# Patient Record
Sex: Male | Born: 1945 | Race: White | Hispanic: No | Smoking: Current every day smoker
Health system: Southern US, Community
[De-identification: ages and names within clinical notes are randomized; demographics above are authoritative.]

## PROBLEM LIST (undated history)

## (undated) DIAGNOSIS — C801 Malignant (primary) neoplasm, unspecified: Secondary | ICD-10-CM

## (undated) DIAGNOSIS — I739 Peripheral vascular disease, unspecified: Secondary | ICD-10-CM

## (undated) DIAGNOSIS — K5730 Diverticulosis of large intestine without perforation or abscess without bleeding: Secondary | ICD-10-CM

## (undated) DIAGNOSIS — K635 Polyp of colon: Secondary | ICD-10-CM

## (undated) DIAGNOSIS — E785 Hyperlipidemia, unspecified: Secondary | ICD-10-CM

## (undated) DIAGNOSIS — I10 Essential (primary) hypertension: Secondary | ICD-10-CM

## (undated) DIAGNOSIS — M5430 Sciatica, unspecified side: Secondary | ICD-10-CM

## (undated) HISTORY — DX: Malignant (primary) neoplasm, unspecified: C80.1

## (undated) HISTORY — DX: Hyperlipidemia, unspecified: E78.5

## (undated) HISTORY — PX: OTHER SURGICAL HISTORY: SHX169

## (undated) HISTORY — DX: Essential (primary) hypertension: I10

## (undated) HISTORY — DX: Diverticulosis of large intestine without perforation or abscess without bleeding: K57.30

## (undated) HISTORY — DX: Sciatica, unspecified side: M54.30

## (undated) HISTORY — DX: Peripheral vascular disease, unspecified: I73.9

## (undated) HISTORY — DX: Polyp of colon: K63.5

---

## 2000-04-02 ENCOUNTER — Encounter: Admission: RE | Admit: 2000-04-02 | Discharge: 2000-04-02 | Payer: Self-pay | Admitting: *Deleted

## 2000-04-02 ENCOUNTER — Encounter: Payer: Self-pay | Admitting: *Deleted

## 2000-08-13 ENCOUNTER — Encounter (INDEPENDENT_AMBULATORY_CARE_PROVIDER_SITE_OTHER): Payer: Self-pay | Admitting: *Deleted

## 2000-08-13 ENCOUNTER — Ambulatory Visit (HOSPITAL_COMMUNITY): Admission: RE | Admit: 2000-08-13 | Discharge: 2000-08-13 | Payer: Self-pay | Admitting: Gastroenterology

## 2003-06-11 HISTORY — PX: ABDOMINAL AORTIC ANEURYSM REPAIR: SUR1152

## 2003-06-24 ENCOUNTER — Encounter: Admission: RE | Admit: 2003-06-24 | Discharge: 2003-06-24 | Payer: Self-pay | Admitting: Internal Medicine

## 2003-06-28 ENCOUNTER — Encounter: Admission: RE | Admit: 2003-06-28 | Discharge: 2003-06-28 | Payer: Self-pay | Admitting: Internal Medicine

## 2003-07-04 ENCOUNTER — Ambulatory Visit (HOSPITAL_COMMUNITY): Admission: RE | Admit: 2003-07-04 | Discharge: 2003-07-04 | Payer: Self-pay | Admitting: Vascular Surgery

## 2003-07-14 ENCOUNTER — Inpatient Hospital Stay (HOSPITAL_COMMUNITY): Admission: RE | Admit: 2003-07-14 | Discharge: 2003-07-19 | Payer: Self-pay | Admitting: Vascular Surgery

## 2003-07-14 ENCOUNTER — Encounter (INDEPENDENT_AMBULATORY_CARE_PROVIDER_SITE_OTHER): Payer: Self-pay | Admitting: Specialist

## 2003-11-29 ENCOUNTER — Ambulatory Visit (HOSPITAL_COMMUNITY): Admission: RE | Admit: 2003-11-29 | Discharge: 2003-11-29 | Payer: Self-pay | Admitting: Gastroenterology

## 2004-09-11 ENCOUNTER — Ambulatory Visit: Payer: Self-pay | Admitting: Internal Medicine

## 2005-01-03 ENCOUNTER — Ambulatory Visit: Payer: Self-pay | Admitting: Internal Medicine

## 2005-01-10 ENCOUNTER — Ambulatory Visit: Payer: Self-pay | Admitting: Internal Medicine

## 2005-04-23 ENCOUNTER — Ambulatory Visit: Payer: Self-pay | Admitting: Internal Medicine

## 2005-05-20 ENCOUNTER — Ambulatory Visit: Payer: Self-pay | Admitting: Internal Medicine

## 2005-05-30 ENCOUNTER — Encounter: Admission: RE | Admit: 2005-05-30 | Discharge: 2005-08-28 | Payer: Self-pay | Admitting: Internal Medicine

## 2005-07-28 IMAGING — US US RETROPERITONEAL COMPLETE
1 series · 13 of 25 positions shown · non-contrast
Comparison: none

CLINICAL DATA: Wheezing, cough, recently stopped smoking, mid abdominal mass, pain, and swelling.  
 RENAL AND AORTIC ULTRASOUND 
 Multiple scans of the abdomen show the mid and lower abdominal aorta to be dilated.  The abdominal aorta measures 5.3 cm in maximal AP diameter and 5.2 cm in transverse diameter.  The right common iliac measures 2.1 cm and the left common iliac measures 1.9 cm.  No evidence of hemorrhage is seen but there is a moderate amount of laminated clot associated with the abdominal aortic aneurysm.  The right kidney measures 12.6 cm in length and appears normal.  The left kidney measures 12.0 cm in length and appears normal.
 IMPRESSION
 Abdominal aortic aneurysm measuring 5.2 x 5.3 cm.  No evidence of active hemorrhage but there is a moderate amount of laminated clot.
 TWO VIEW CHEST 
 PA and lateral views of the chest show generalized peribronchial thickening with flattening of the hemidiaphragm and increased AP diameter of the chest suggesting a mild to moderate degree of chronic obstructive pulmonary disease.  The aorta is minimally elongated, dilated, and calcified.  The heart is within normal limit.  The bony thorax is normal for the patient?s age.
 Mild to moderate chronic obstructive pulmonary change.  No active disease in the chest.

[Series 1: unknown · 0.29mm/px · 13 of 44 slices shown]
[im 1/44]
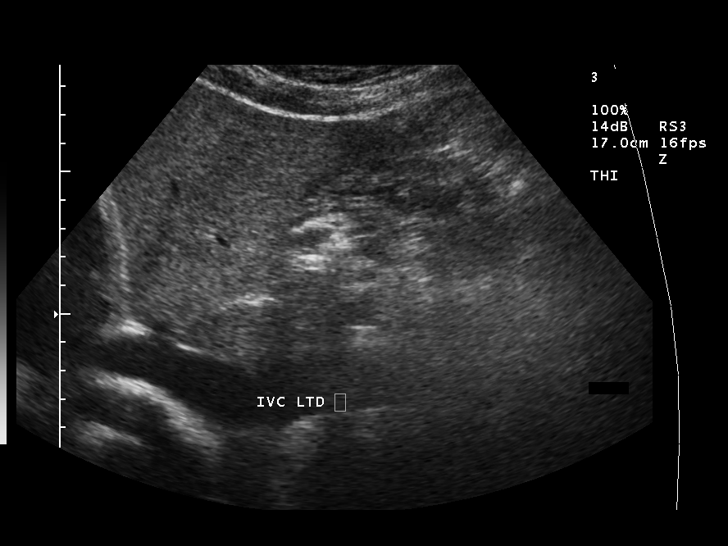
[im 4/44]
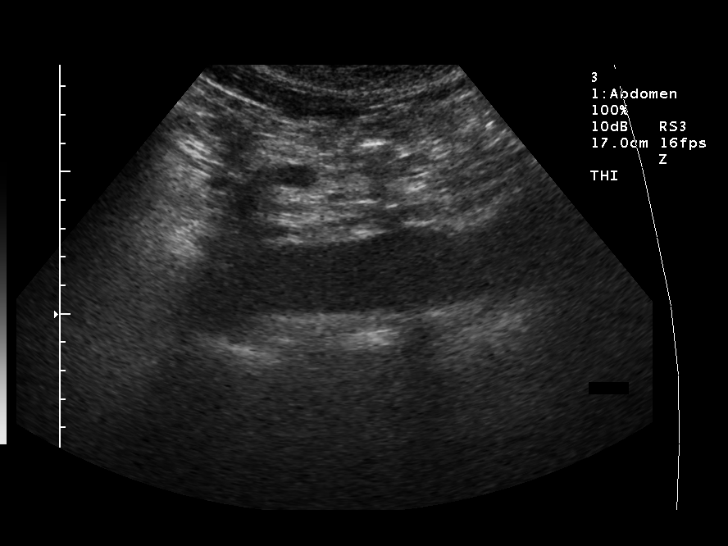
[im 8/44]
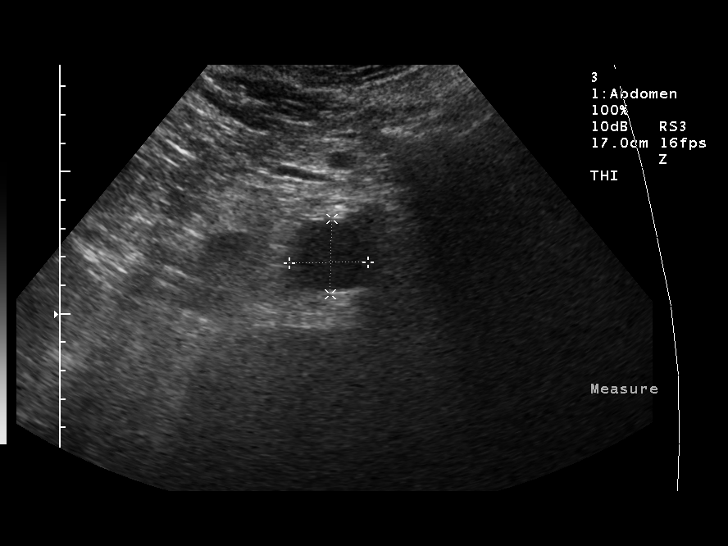
[im 11/44]
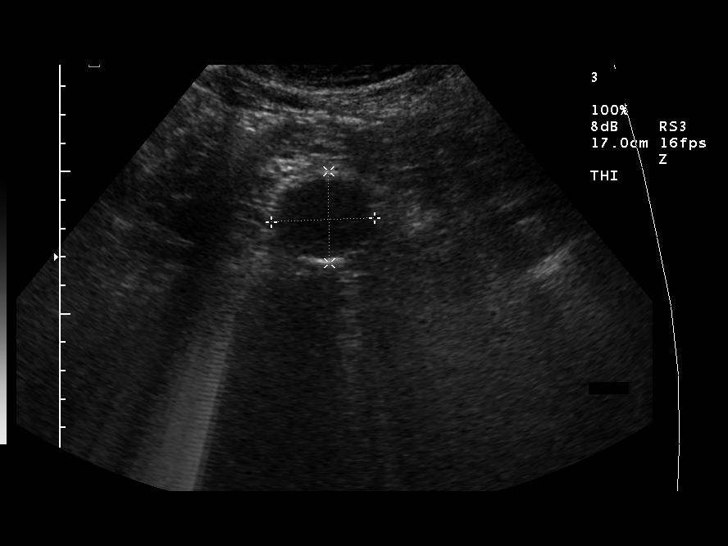
[im 15/44]
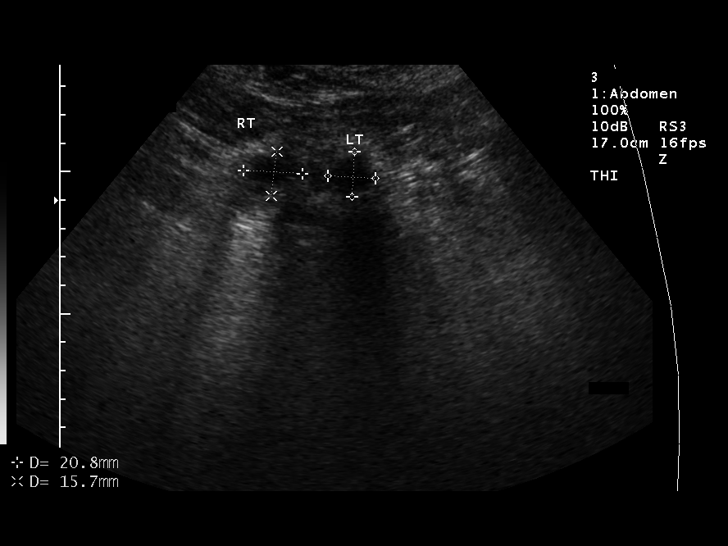
[im 18/44]
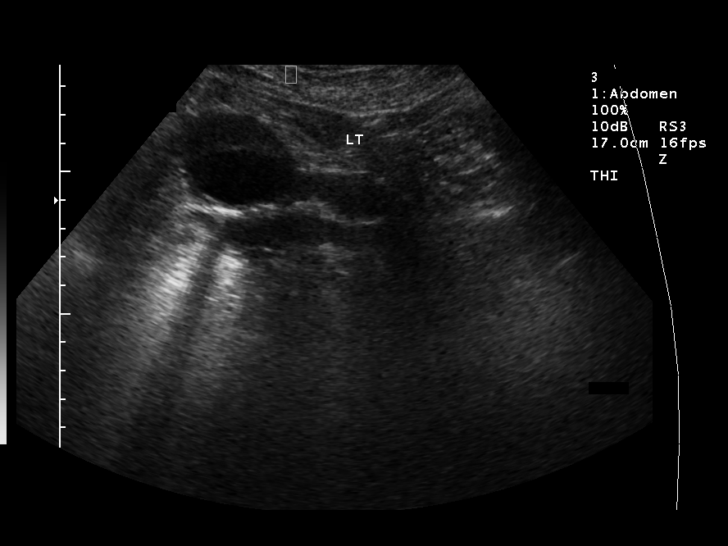
[im 22/44]
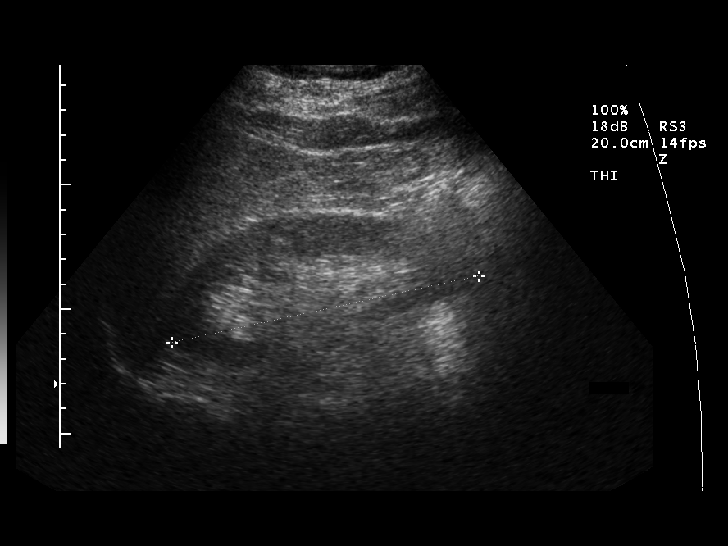
[im 26/44]
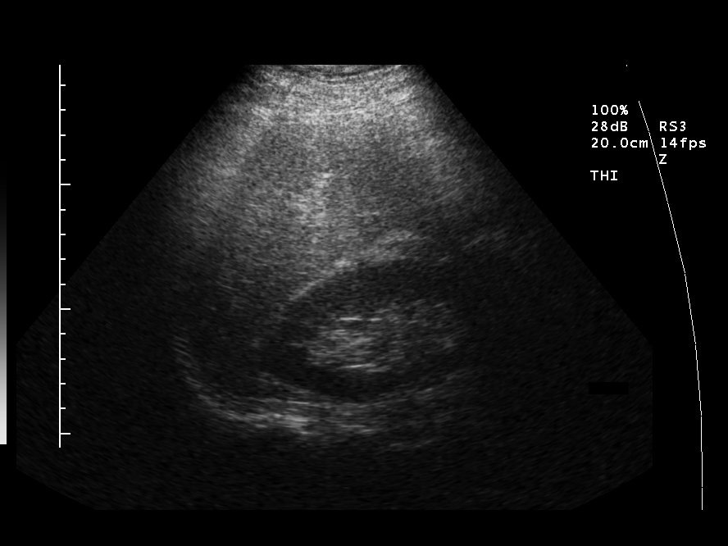
[im 29/44]
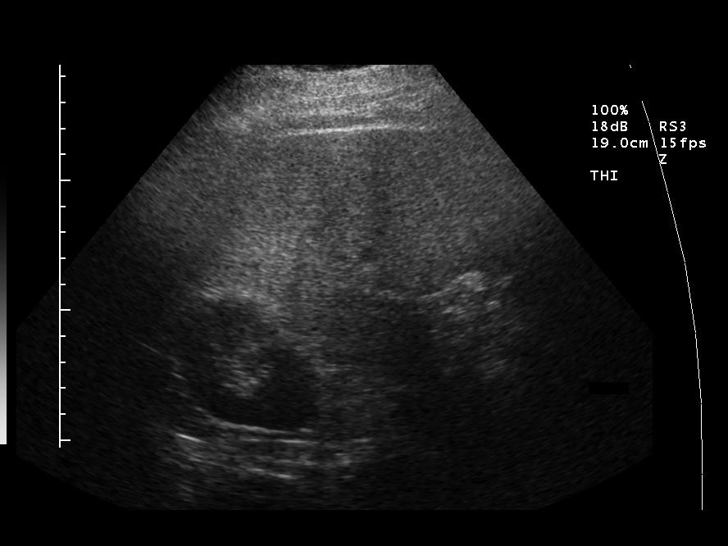
[im 33/44]
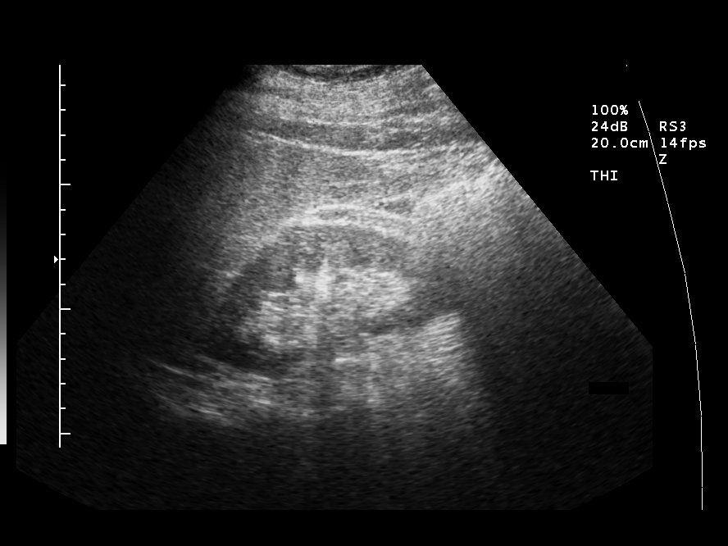
[im 36/44]
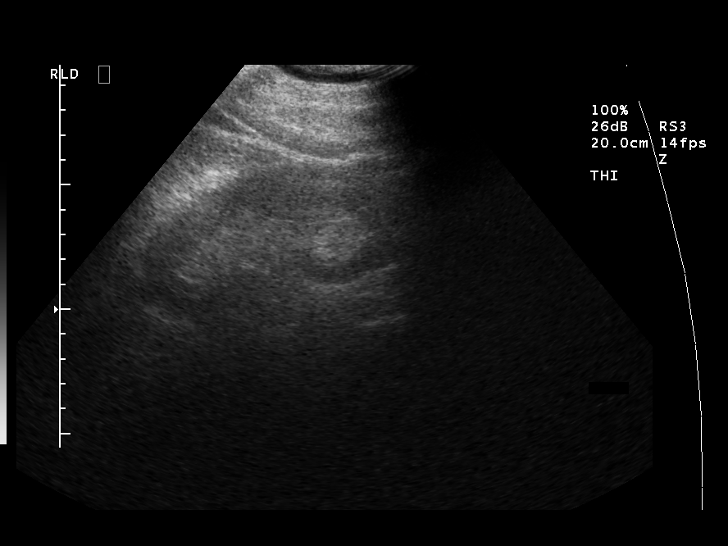
[im 40/44]
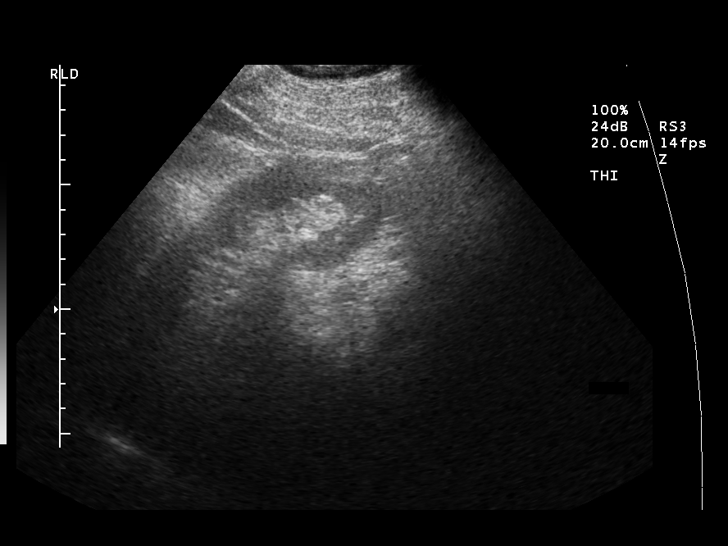
[im 44/44]
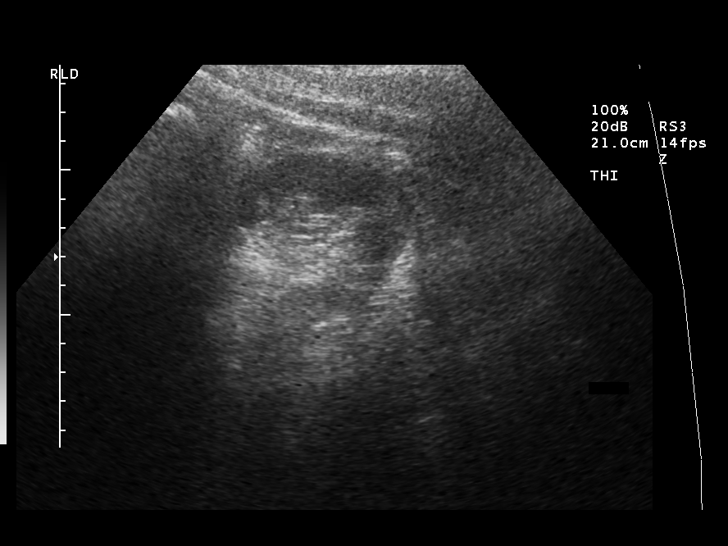

[13 of 25 positions shown; findings below may reference images not displayed]

## 2006-05-14 ENCOUNTER — Ambulatory Visit: Payer: Self-pay | Admitting: Internal Medicine

## 2006-05-14 ENCOUNTER — Encounter (INDEPENDENT_AMBULATORY_CARE_PROVIDER_SITE_OTHER): Payer: Self-pay | Admitting: *Deleted

## 2006-05-14 LAB — CONVERTED CEMR LAB
ALT: 27 units/L (ref 0–40)
AST: 26 units/L (ref 0–37)
BUN: 22 mg/dL (ref 6–23)
Chol/HDL Ratio, serum: 3.8
Cholesterol: 184 mg/dL (ref 0–200)
Creatinine, Ser: 1.1 mg/dL (ref 0.4–1.5)
HDL: 48.6 mg/dL (ref >39.0)
Hgb A1c MFr Bld: 6.1 % — ABNORMAL HIGH (ref 4.6–6.0)
LDL Cholesterol: 100 mg/dL — ABNORMAL HIGH (ref 0–99)
PSA: 0.45 ng/mL
PSA: 0.45 ng/mL (ref 0.10–4.00)
Potassium: 4.1 meq/L (ref 3.5–5.1)
Triglyceride fasting, serum: 175 mg/dL — ABNORMAL HIGH (ref 0–149)
VLDL: 35 mg/dL (ref 0–40)

## 2006-05-22 ENCOUNTER — Ambulatory Visit: Payer: Self-pay | Admitting: Internal Medicine

## 2006-09-10 ENCOUNTER — Ambulatory Visit: Payer: Self-pay | Admitting: Internal Medicine

## 2006-09-10 LAB — CONVERTED CEMR LAB
ALT: 31 units/L (ref 0–40)
AST: 24 units/L (ref 0–37)
BUN: 17 mg/dL (ref 6–23)
Cholesterol: 159 mg/dL (ref 0–200)
Creatinine, Ser: 1 mg/dL (ref 0.4–1.5)
Creatinine,U: 187 mg/dL
HDL: 53.2 mg/dL (ref >39.0)
Hgb A1c MFr Bld: 6 % (ref 4.6–6.0)
LDL Cholesterol: 73 mg/dL (ref 0–99)
Microalb Creat Ratio: 1.1 mg/g (ref 0.0–30.0)
Microalb, Ur: 0.2 mg/dL (ref 0.0–1.9)
Potassium: 4.1 meq/L (ref 3.5–5.1)
Total CHOL/HDL Ratio: 3
Triglycerides: 165 mg/dL — ABNORMAL HIGH (ref 0–149)
VLDL: 33 mg/dL (ref 0–40)

## 2007-07-27 ENCOUNTER — Telehealth: Payer: Self-pay | Admitting: Internal Medicine

## 2007-08-06 ENCOUNTER — Telehealth (INDEPENDENT_AMBULATORY_CARE_PROVIDER_SITE_OTHER): Payer: Self-pay | Admitting: *Deleted

## 2007-08-06 ENCOUNTER — Encounter (INDEPENDENT_AMBULATORY_CARE_PROVIDER_SITE_OTHER): Payer: Self-pay | Admitting: *Deleted

## 2007-08-06 DIAGNOSIS — E785 Hyperlipidemia, unspecified: Secondary | ICD-10-CM

## 2007-08-06 DIAGNOSIS — E782 Mixed hyperlipidemia: Secondary | ICD-10-CM | POA: Insufficient documentation

## 2007-08-06 DIAGNOSIS — E8881 Metabolic syndrome: Secondary | ICD-10-CM | POA: Insufficient documentation

## 2007-08-06 DIAGNOSIS — Z85828 Personal history of other malignant neoplasm of skin: Secondary | ICD-10-CM | POA: Insufficient documentation

## 2007-08-06 DIAGNOSIS — I10 Essential (primary) hypertension: Secondary | ICD-10-CM | POA: Insufficient documentation

## 2007-09-11 ENCOUNTER — Telehealth (INDEPENDENT_AMBULATORY_CARE_PROVIDER_SITE_OTHER): Payer: Self-pay | Admitting: *Deleted

## 2007-11-05 ENCOUNTER — Telehealth (INDEPENDENT_AMBULATORY_CARE_PROVIDER_SITE_OTHER): Payer: Self-pay | Admitting: *Deleted

## 2007-11-20 ENCOUNTER — Telehealth (INDEPENDENT_AMBULATORY_CARE_PROVIDER_SITE_OTHER): Payer: Self-pay | Admitting: *Deleted

## 2007-12-24 ENCOUNTER — Ambulatory Visit: Payer: Self-pay | Admitting: Internal Medicine

## 2007-12-24 DIAGNOSIS — M545 Low back pain, unspecified: Secondary | ICD-10-CM | POA: Insufficient documentation

## 2008-01-04 ENCOUNTER — Ambulatory Visit: Payer: Self-pay | Admitting: Internal Medicine

## 2008-01-04 ENCOUNTER — Encounter (INDEPENDENT_AMBULATORY_CARE_PROVIDER_SITE_OTHER): Payer: Self-pay | Admitting: *Deleted

## 2008-01-04 LAB — CONVERTED CEMR LAB
OCCULT 1: NEGATIVE
OCCULT 2: NEGATIVE
OCCULT 3: NEGATIVE

## 2008-06-20 ENCOUNTER — Encounter: Payer: Self-pay | Admitting: Internal Medicine

## 2008-12-20 ENCOUNTER — Ambulatory Visit: Payer: Self-pay | Admitting: Internal Medicine

## 2008-12-20 DIAGNOSIS — K5730 Diverticulosis of large intestine without perforation or abscess without bleeding: Secondary | ICD-10-CM | POA: Insufficient documentation

## 2008-12-20 DIAGNOSIS — R109 Unspecified abdominal pain: Secondary | ICD-10-CM | POA: Insufficient documentation

## 2008-12-20 DIAGNOSIS — Z86010 Personal history of colonic polyps: Secondary | ICD-10-CM | POA: Insufficient documentation

## 2008-12-28 ENCOUNTER — Encounter (INDEPENDENT_AMBULATORY_CARE_PROVIDER_SITE_OTHER): Payer: Self-pay | Admitting: *Deleted

## 2009-03-10 LAB — HM COLONOSCOPY

## 2009-04-11 ENCOUNTER — Encounter: Payer: Self-pay | Admitting: Internal Medicine

## 2009-04-21 ENCOUNTER — Telehealth: Payer: Self-pay | Admitting: Internal Medicine

## 2009-04-26 ENCOUNTER — Ambulatory Visit: Payer: Self-pay | Admitting: Internal Medicine

## 2009-04-26 DIAGNOSIS — M48061 Spinal stenosis, lumbar region without neurogenic claudication: Secondary | ICD-10-CM | POA: Insufficient documentation

## 2009-04-26 DIAGNOSIS — M5416 Radiculopathy, lumbar region: Secondary | ICD-10-CM | POA: Insufficient documentation

## 2009-05-03 ENCOUNTER — Encounter: Admission: RE | Admit: 2009-05-03 | Discharge: 2009-05-03 | Payer: Self-pay | Admitting: Internal Medicine

## 2009-05-24 ENCOUNTER — Encounter: Admission: RE | Admit: 2009-05-24 | Discharge: 2009-05-24 | Payer: Self-pay | Admitting: Internal Medicine

## 2009-05-31 ENCOUNTER — Encounter: Payer: Self-pay | Admitting: Internal Medicine

## 2009-06-05 ENCOUNTER — Encounter: Admission: RE | Admit: 2009-06-05 | Discharge: 2009-06-05 | Payer: Self-pay | Admitting: Internal Medicine

## 2009-09-27 ENCOUNTER — Telehealth (INDEPENDENT_AMBULATORY_CARE_PROVIDER_SITE_OTHER): Payer: Self-pay | Admitting: *Deleted

## 2009-11-30 ENCOUNTER — Telehealth (INDEPENDENT_AMBULATORY_CARE_PROVIDER_SITE_OTHER): Payer: Self-pay | Admitting: *Deleted

## 2010-04-10 HISTORY — PX: LUMBAR SPINE SURGERY: SHX701

## 2010-04-16 ENCOUNTER — Telehealth: Payer: Self-pay | Admitting: Internal Medicine

## 2010-04-16 ENCOUNTER — Telehealth (INDEPENDENT_AMBULATORY_CARE_PROVIDER_SITE_OTHER): Payer: Self-pay | Admitting: *Deleted

## 2010-04-23 ENCOUNTER — Encounter: Payer: Self-pay | Admitting: Internal Medicine

## 2010-04-24 ENCOUNTER — Encounter: Payer: Self-pay | Admitting: Internal Medicine

## 2010-04-25 ENCOUNTER — Encounter: Payer: Self-pay | Admitting: Internal Medicine

## 2010-04-26 ENCOUNTER — Encounter: Payer: Self-pay | Admitting: Internal Medicine

## 2010-05-10 ENCOUNTER — Ambulatory Visit: Payer: Self-pay | Admitting: Internal Medicine

## 2010-05-10 DIAGNOSIS — K4090 Unilateral inguinal hernia, without obstruction or gangrene, not specified as recurrent: Secondary | ICD-10-CM | POA: Insufficient documentation

## 2010-05-11 ENCOUNTER — Ambulatory Visit: Payer: Self-pay | Admitting: Internal Medicine

## 2010-05-14 LAB — CONVERTED CEMR LAB
ALT: 16 units/L (ref 0–53)
AST: 18 units/L (ref 0–37)
Albumin: 3.3 g/dL — ABNORMAL LOW (ref 3.5–5.2)
Alkaline Phosphatase: 81 units/L (ref 39–117)
BUN: 22 mg/dL (ref 6–23)
Bilirubin, Direct: 0.2 mg/dL (ref 0.0–0.3)
Creatinine, Ser: 0.9 mg/dL (ref 0.4–1.5)
Hgb A1c MFr Bld: 5.8 % (ref 4.6–6.5)
PSA: 1.05 ng/mL (ref 0.10–4.00)
Potassium: 4.5 meq/L (ref 3.5–5.1)
TSH: 2.39 microintl units/mL (ref 0.35–5.50)
Total Bilirubin: 1 mg/dL (ref 0.3–1.2)
Total Protein: 6.3 g/dL (ref 6.0–8.3)

## 2010-07-08 LAB — CONVERTED CEMR LAB
ALT: 36 units/L (ref 0–53)
ALT: 36 units/L (ref 0–53)
AST: 31 units/L (ref 0–37)
AST: 36 units/L (ref 0–37)
Albumin: 3.6 g/dL (ref 3.5–5.2)
Albumin: 3.8 g/dL (ref 3.5–5.2)
Alkaline Phosphatase: 63 units/L (ref 39–117)
Alkaline Phosphatase: 69 units/L (ref 39–117)
BUN: 12 mg/dL (ref 6–23)
BUN: 20 mg/dL (ref 6–23)
Basophils Absolute: 0 10*3/uL (ref 0.0–0.1)
Basophils Relative: 0.7 % (ref 0.0–3.0)
Bilirubin, Direct: 0.1 mg/dL (ref 0.0–0.3)
Bilirubin, Direct: 0.2 mg/dL (ref 0.0–0.3)
CO2: 30 meq/L (ref 19–32)
CO2: 30 meq/L (ref 19–32)
Calcium: 9.2 mg/dL (ref 8.4–10.5)
Calcium: 9.2 mg/dL (ref 8.4–10.5)
Chloride: 101 meq/L (ref 96–112)
Chloride: 103 meq/L (ref 96–112)
Cholesterol, target level: 200 mg/dL
Cholesterol: 119 mg/dL (ref 0–200)
Cholesterol: 166 mg/dL (ref 0–200)
Creatinine, Ser: 0.9 mg/dL (ref 0.4–1.5)
Creatinine, Ser: 0.9 mg/dL (ref 0.4–1.5)
Eosinophils Absolute: 0.1 10*3/uL (ref 0.0–0.7)
Eosinophils Relative: 2 % (ref 0.0–5.0)
GFR calc Af Amer: 110 mL/min
GFR calc non Af Amer: 90.35 mL/min (ref >60)
GFR calc non Af Amer: 91 mL/min
Glucose, Bld: 125 mg/dL — ABNORMAL HIGH (ref 70–99)
Glucose, Bld: 92 mg/dL (ref 70–99)
HCT: 41.2 % (ref 39.0–52.0)
HCT: 43.3 % (ref 39.0–52.0)
HDL goal, serum: 40 mg/dL
HDL: 52 mg/dL (ref >39.0)
HDL: 57.1 mg/dL (ref >39.00)
Hemoglobin: 14 g/dL (ref 13.0–17.0)
Hemoglobin: 14.6 g/dL (ref 13.0–17.0)
Hgb A1c MFr Bld: 5.8 % (ref 4.6–6.5)
Hgb A1c MFr Bld: 5.9 % (ref 4.6–6.0)
LDL Cholesterol: 50 mg/dL (ref 0–99)
LDL Cholesterol: 76 mg/dL (ref 0–99)
LDL Goal: 100 mg/dL
Lymphocytes Relative: 33.5 % (ref 12.0–46.0)
MCHC: 33.7 g/dL (ref 30.0–36.0)
MCHC: 33.9 g/dL (ref 30.0–36.0)
MCV: 98.8 fL (ref 78.0–100.0)
MCV: 99.2 fL (ref 78.0–100.0)
Monocytes Absolute: 0.8 10*3/uL (ref 0.1–1.0)
Monocytes Relative: 12.9 % — ABNORMAL HIGH (ref 3.0–12.0)
Neutro Abs: 3.1 10*3/uL (ref 1.4–7.7)
Neutrophils Relative %: 50.9 % (ref 43.0–77.0)
PSA: 0.49 ng/mL (ref 0.10–4.00)
PSA: 0.66 ng/mL (ref 0.10–4.00)
Platelets: 163 10*3/uL (ref 150–400)
Platelets: 182 10*3/uL (ref 150.0–400.0)
Potassium: 5.1 meq/L (ref 3.5–5.1)
Potassium: 5.3 meq/L — ABNORMAL HIGH (ref 3.5–5.1)
RBC: 4.17 M/uL — ABNORMAL LOW (ref 4.22–5.81)
RBC: 4.36 M/uL (ref 4.22–5.81)
RDW: 12.8 % (ref 11.5–14.6)
RDW: 13 % (ref 11.5–14.6)
Sodium: 138 meq/L (ref 135–145)
Sodium: 141 meq/L (ref 135–145)
TSH: 1.3 microintl units/mL (ref 0.35–5.50)
TSH: 1.43 microintl units/mL (ref 0.35–5.50)
Total Bilirubin: 0.7 mg/dL (ref 0.3–1.2)
Total Bilirubin: 1 mg/dL (ref 0.3–1.2)
Total CHOL/HDL Ratio: 2
Total CHOL/HDL Ratio: 3.2
Total Protein: 6.6 g/dL (ref 6.0–8.3)
Total Protein: 6.8 g/dL (ref 6.0–8.3)
Triglycerides: 190 mg/dL — ABNORMAL HIGH (ref 0–149)
Triglycerides: 61 mg/dL (ref 0.0–149.0)
VLDL: 12.2 mg/dL (ref 0.0–40.0)
VLDL: 38 mg/dL (ref 0–40)
WBC: 6 10*3/uL (ref 4.5–10.5)
WBC: 6.2 10*3/uL (ref 4.5–10.5)

## 2010-07-10 NOTE — Procedures (Signed)
Summary: Colonoscopy/[facility]  Colonoscopy/[facility]   Imported By: Lanelle Bal 06/15/2009 08:26:04  _____________________________________________________________________  External Attachment:    Type:   Image     Comment:   External Document

## 2010-07-10 NOTE — Progress Notes (Signed)
Summary: Hopper-Refill  Phone Note Refill Request   Refills Requested: Medication #1:  VYTORIN 10-20 MG  TABS 1 by mouth once daily [facility] [phone]  Initial call taken by: Freddy Jaksch,  Nov 05, 2007 9:44 AM      Prescriptions: VYTORIN 10-20 MG  TABS (EZETIMIBE-SIMVASTATIN) 1 by mouth once daily  #30 x 1   Entered by:   Ardyth Man   Authorized by:   Marga Melnick MD   Signed by:   Ardyth Man on 11/05/2007   Method used:   Electronically sent to ...       [facility] [address] [id number]       [address]       [place], [place]  [address]       Ph: [phone]       Fax: [phone]   RxID:   [id number]

## 2010-07-10 NOTE — Letter (Signed)
Summary: Results Follow up Letter   at [place]  [address] [place], [place] [address]   Phone: [phone]  Fax: [phone]    01/04/2008 MRN: [id number]  [facility] [address] [place], [place]  [address]  Dear Mr. Nilsson,  The following are the results of your recent test(s):  Test         Result    Pap Smear:        Normal _____  Not Normal _____ Comments: ______________________________________________________ Cholesterol: LDL(Bad cholesterol):         Your goal is less than:         HDL (Good cholesterol):       Your goal is more than: Comments:  ______________________________________________________ Mammogram:        Normal _____  Not Normal _____ Comments:  ___________________________________________________________________ Hemoccult:        Normal _x____  Not normal _______ Comments:    _____________________________________________________________________ Other Tests:    We routinely do not discuss normal results over the telephone.  If you desire a copy of the results, or you have any questions about this information we can discuss them at your next office visit.   Sincerely,

## 2010-07-10 NOTE — Letter (Signed)
Summary: Wound Care Instructions/[facility]  Wound Care Instructions/[facility]   Imported By: Lanelle Bal 05/17/2010 12:29:04  _____________________________________________________________________  External Attachment:    Type:   Image     Comment:   External Document

## 2010-07-10 NOTE — Progress Notes (Signed)
Summary: Handicapp Sticker request  Phone Note Call from Patient Call back at [phone]   Summary of Call: Message left on VM: Patient would like a Handicapp sticker   Dr.Hopper please advise, do you agree with patient's request that he needs a Handicapp Sticker (Last OV 04/26/2009) Initial call taken by: Shonna Chock,  September 27, 2009 12:13 PM  Follow-up for Phone Call        OK;LS DDD with sciatica Follow-up by: Marga Melnick MD,  September 27, 2009 4:09 PM  Additional Follow-up for Phone Call Additional follow up Details #1::        Patient aware handicapp place card avaliable for pick-up Additional Follow-up by: Shonna Chock,  September 27, 2009 4:15 PM

## 2010-07-10 NOTE — Progress Notes (Signed)
Summary: need records sent-lmom  Phone Note Call from Patient   Caller: Patient Call For: DR/NURSE Summary of Call: PATIENT NEEDS HIS RECORDS RELEASED TO HIS INSURANCE COMPANY. PATIENT DIDNT HAVE ANY INFORMATION ABOUT HIS INSURANCE COMPANY, PHONE NUMBER, FAX NUMBER, WHICH RECORDS, ECT . . . Marland KitchenPATIENT SAID HE IS ON VACATION AND WE COULD CONTACT HIS ASSISTANT TO ASK ANY ADDITIONAL QUESTIONS ([phone]). I TRIED TO CALL WENDY, NUMBER NO LONGER IN SERVICE. Initial call taken by: Shonna Chock,  November 20, 2007 4:06 PM  Follow-up for Phone Call        [facility] NUMBER). REASON FOR CALL TO CONTACT us WHEN AVALIABLE TO GIVE [place] A DIFFERENT NUMBER TO CONTACT HIS ASSISTANT. NUMBER GIVEN-NO LONGER IN SERVICE./[facility]  November 20, 2007 4:10 PM

## 2010-07-10 NOTE — Letter (Signed)
Summary: Results Follow up Letter  [facility] [place]  [address] [place], [place] [address]   Phone: [phone]  Fax: [phone]    12/28/2008 MRN: [id number]  [facility] [address] [place], [place]  [address]  Dear Mr. Essner,  The following are the results of your recent test(s):  Test         Result    Pap Smear:        Normal _____  Not Normal _____ Comments: ______________________________________________________ Cholesterol: LDL(Bad cholesterol):         Your goal is less than:         HDL (Good cholesterol):       Your goal is more than: Comments:  ______________________________________________________ Mammogram:        Normal _____  Not Normal _____ Comments:  ___________________________________________________________________ Hemoccult:        Normal _____  Not normal _______ Comments:    _____________________________________________________________________ Other Tests: PLEASE SEE ATTACHED LABS DONE ON 12/20/2008    We routinely do not discuss normal results over the telephone.  If you desire a copy of the results, or you have any questions about this information we can discuss them at your next office visit.   Sincerely,

## 2010-07-10 NOTE — Progress Notes (Signed)
Summary: rx vytorin - dr Harriet Bollen  Phone Note Refill Request   Refills Requested: Medication #1:  VYTORIN 10/20MG  PATIENT IS IN [place] - WANTS RX [facility] [place] [phone] patient has cpx 0709  Initial call taken by: Okey Regal Spring,  July 27, 2007 4:51 PM  Follow-up for Phone Call        hopp, this patient has not been seen since 05/2006 and is in [place], has a cpx scheduled in july 2009, what do you want to do?....................................................................Marland KitchenWendall Stade  July 28, 2007 1:37 PM   Additional Follow-up for Phone Call Additional follow up Details #1::        Refill X 1 month. Okey Regal, call & see when he is coming back to [place] from [place]. Schedule fasting lipids, A1c, BUN,creat,K+, hepatic panel, TSH as soon as he can upon return with F/U OV 5-7 days later. Cancel CPX in 7/09. If he will be in [facility] for several months he should see MD there  & have labs there as he has not been seen since 12/07. It is necessary to verify the med is working without adverse effects as per the standard of care. Aram Beecham, please have this preloaded. Additional Follow-up by: Marga Melnick MD,  July 29, 2007 5:36 AM    Additional Follow-up for Phone Call Additional follow up Details #2::    rx called to pharmacy in [facility] left msg for pt to call called pt cell #[phone]........vytorin added to pt med list...................................................................Marland KitchenKandice Hams  July 29, 2007 11:50 AM   Follow-up by: Kandice Hams,  July 29, 2007 11:50 AM  Additional Follow-up for Phone Call Additional follow up Details #3:: Details for Additional Follow-up Action Taken: call in Vytorin for 3 NOT 1 month; Okey Regal to schedule labs & OV upon his return to [place]. If he'll be in [place] longer than that 3 month supply; he should have follow up in [place] with MD Additional Follow-up by: Marga Melnick MD,  July 29, 2007 2:01 PM  New/Updated  Medications: VYTORIN 10-20 MG  TABS (EZETIMIBE-SIMVASTATIN) 1 by mouth once daily   Prescriptions: VYTORIN 10-20 MG  TABS (EZETIMIBE-SIMVASTATIN) 1 by mouth once daily  #30 x 2   Entered by:   Kandice Hams   Authorized by:   Marga Melnick MD   Signed by:   Kandice Hams on 07/29/2007   Method used:   Telephoned to ...       [facility] [address] [id number]       [address]       [place], [place]  [address]       Ph: [phone]       Fax: [phone]   RxID:   [id number]

## 2010-07-10 NOTE — Progress Notes (Signed)
Summary: Request for ESI Treatments  Phone Note Call from Patient Call back at [phone]   Caller: Patient Summary of Call: Message left on VM: Patient would like order to get 3 injections , he had this done in 04/2009 and would like to do it again.   Roger Stewart  November 30, 2009 2:45 PM   Follow-up for Phone Call        Dr.Hopper do you agree with patient's request to get ESI treatments again? If yes place order and send note back to me to contact the patient Follow-up by: Shonna Chock,  November 30, 2009 2:46 PM  Additional Follow-up for Phone Call Additional follow up Details #1::        These should be limited as to frequency & dose  due to adverse long term effects . I will be happy to refer him to a N-S who can determine if appropriate & safe to repeat @ this time. Additional Follow-up by: Marga Melnick MD,  November 30, 2009 2:58 PM    Additional Follow-up for Phone Call Additional follow up Details #2::    pt states that he has already spoken to N-S in [place] who recommended that he gets this done. pt would like to go forward with this. please advise if ok to order......Marland KitchenFelecia Deloach CMA  November 30, 2009 3:13 PM   Additional Follow-up for Phone Call Additional follow up Details #3:: Details for Additional Follow-up Action Taken: I called the patient and informed him his neuro Dr would have to order this and he indicated that his neuro is in [place] and he does not understand why we can not order it and he will just find him a new Dr.   I apologized for his inconvience and offerd to set him up with a local neuro and he refused and said he will just find a New Primary Care Dr. Additional Follow-up by: Shonna Chock,  November 30, 2009 3:21 PM   Appended Document: Request for ESI Treatments For any MD (especially a non back specialist) to Rx ESI w/o an interval evaluation & documentation of risk to  benefit factors would be malpractice.For patient safety (which is the paramount issue @  all times), a re-evaluation by an back specialist (Neurosurgeon or Orthopedist) to rule out any new or potentially complicating process  is the Standard of Care. I shall send these phone notes with release of records request  to [name]. I am truly sorry he is upset ,but this is not a benign procedure which can be ordered over the phone without appropriate pre op assessment.

## 2010-07-10 NOTE — Consult Note (Signed)
Summary: [facility], [place], [facility]  [facility], [place], [place]   Imported By: Lanelle Bal 05/02/2009 11:32:45  _____________________________________________________________________  External Attachment:    Type:   Image     Comment:   External Document

## 2010-07-10 NOTE — Progress Notes (Signed)
Summary: LISINOPRIL REFILL  Phone Note Refill Request Call back at [phone] Message from:  Patient on September 11, 2007 9:17 AM  Refills Requested: Medication #1:  LISINOPRIL 20 MG TABS 1 by mouth qd   Notes: NEEDS TODAY OUT OF MEDICATION PT IN [place] OUT OF MEDICATION NEEDS [facility]# [phone]  Initial call taken by: Doristine Devoid,  September 11, 2007 9:18 AM  Follow-up for Phone Call        called med to pharmacy and called patient and made sure he has appt which is in July....................................................................Marland KitchenWendall Stade  September 11, 2007 11:44 AM       Prescriptions: LISINOPRIL 20 MG TABS (LISINOPRIL) 1 by mouth qd  #90 Each x 1   Entered by:   Wendall Stade   Authorized by:   Marga Melnick MD   Signed by:   Wendall Stade on 09/11/2007   Method used:   Telephoned to ...       [facility] [id number]       [address]       [place], [place]  [address]       Ph: [phone]       Fax: [phone]   RxID:   [id number]

## 2010-07-10 NOTE — Progress Notes (Signed)
Summary: lmom--11-7   pt needs appt--faxed paperwork to [facility]  Phone Note Call from Patient   Summary of Call: left message for patient to call me--in order for Dr Alwyn Ren to fill out medical history form for [facility], patient needs to be seen for a fasting office visit becuase it has been almost a year since Dr Alwyn Ren has seen him---will need an early 30 minute appointment with fasting labs---(Please let Maralyn Sago know if you schedule appointment so I can get paperwork back to [facility]) Initial call taken by: Jerolyn Shin,  April 16, 2010 9:41 AM  Follow-up for Phone Call        I left message stating I had copied all medical records we have on file (07/10 & 11/10) with labs to attach to form .( That form is for medical offices who do not not EMR & who use paper charts which are too often illegible). I told him I believe they will want up dated medical  evaluation as last visit was 12 months ago Unless he has seen another MD  in the interim , lack of update will most likely delay surgery Follow-up by: Marga Melnick MD,  April 17, 2010 8:13 AM  Additional Follow-up for Phone Call Additional follow up Details #1::        Patient is now in [place], so he is not able to schedule an appointment to see Dr Alwyn Ren before his scheduled surgery by [facility].   Per discussion with Dr Alwyn Ren, I faxed all paperwork to the [facility] (the form from [facility] was blank--Dr Alwyn Ren did not make any notations; I also sent a copies of office notes from 12/17/2008 and 04/19/2009 as well as labs from 12/2008)    At the bottom of the cover sheet, I wrote the note that these are the latest notes that we have for this patient. Additional Follow-up by: Jerolyn Shin,  April 19, 2010 9:14 AM

## 2010-07-10 NOTE — Progress Notes (Signed)
Summary: DR Doyne Micke TO REVIEW MRI OF BACK  Phone Note Call from Patient Call back at CELL = [phone]   Caller: Patient Summary of Call: PATIENT WAS IN [place] AND WENT TO A DOCTOR--BECAUSE OF BACK PAIN, XRAY AND MRI WAS PERFORMED---PATIENT STATES THAT HE WAS TOLD HE NEEDED EPIDERMAL NERVE BLOCK ON L4 JOINT  HE LEFT COPIES OF PRESCRIPTION AND MRI REPORT FOR DR Sulo Janczak TO LOOK AT ----PT HAD CPX ON 12/20/2008  CAN DR Floyce Bujak GIVE HIM A REFERRAL BASED ON THIS MRI REPORT AND PRESCRIPTION OR DOES HE NEED TO SEE PATIENT FIRST??  PLEASE CALL HIM ON CELL [phone]  WILL TAKE TO [facility] IN [facility]LASTIC [place]EVE Initial call taken by: Jerolyn Shin,  April 21, 2009 4:06 PM  Follow-up for Phone Call        This was placed on ledge for review Follow-up by: Shonna Chock,  April 24, 2009 8:46 AM  Additional Follow-up for Phone Call Additional follow up Details #1::        he would have to be seen by N-S in [place] before definitive recommendation; they would need actual MRI images Additional Follow-up by: Marga Melnick MD,  April 24, 2009 1:26 PM    Additional Follow-up for Phone Call Additional follow up Details #2::    pt coming in on 11-17 for nerve block evaluation per dr Kash Davie. pt advise to bring in all material in regards to this procedure....Marland KitchenMarland KitchenFelecia Deloach CMA  April 24, 2009 3:10 PM

## 2010-07-10 NOTE — Progress Notes (Signed)
Summary: call to patient about refill and called to pharmacy in [place]  Phone Note Refill Request Call back at [phone] - cell   Refills Requested: Medication #1:  METOPROLOL SUCCINATE 50 MG TB24 1 by mouth qd patient needs [facility]ill - [facility] [phone] ------ [place]  ---- patient has cpx scheduled 0709 he will be back for cpx - this patient lives in [place] 6 months & [place] 6 months  Initial call taken by: Okey Regal Spring,  August 06, 2007 4:24 PM  Follow-up for Phone Call        called to the pharmacy in [place] and called the patient to alert him to the change in the metoprolol from once to twice a day and it is the tartrate not the succinate.  Patient was pleased as that will be cheaper for him....................................................................Marland KitchenWendall Stade  August 06, 2007 4:57 PM     New/Updated Medications: METOPROLOL TARTRATE 25 MG  TABS (METOPROLOL TARTRATE) 1 by mouth bid   Prescriptions: METOPROLOL TARTRATE 25 MG  TABS (METOPROLOL TARTRATE) 1 by mouth bid  #180 x 1   Entered by:   Wendall Stade   Authorized by:   Marga Melnick MD   Signed by:   Wendall Stade on 08/06/2007   Method used:   Telephoned to ...       [facility] [address] [id number]       [address]       [place], [place]  [address]       Ph: [phone]       Fax: [phone]   RxID:   [id number]  This was not called to the [facility] but [facility] [place] , [employer]

## 2010-07-10 NOTE — Progress Notes (Signed)
Summary: needs Dr Alwyn Ren to call him tonite--added note  Phone Note Call from Patient Call back at [phone]   Caller: Patient Summary of Call: Spoke to patient and told him that,  per Dr Cathe Mons order for Doctor Alwyn Ren to fill out the medical history form for [facility] in [place], [place], he needs for patient to schedule a fasting office visit  Patient was VERY upset and said his "surgery with [facility] is scheduled for next week and why cant  Dr Alwyn Ren just fill out this form--He has seen patient for years and knows his history"  ---I pointed out that it was probably because patient had not seen Dr Alwyn Ren in a while and that Dr Alwyn Ren needs to enter accurate, up to date info.  (Patient was seen 04/26/2009 for nerve block evaluation and for CPX on 12/20/2008)    Patient is upset and frustrated because surgery is scheduled and we wont fill out form.  He said he was going to call Dr Alwyn Ren at home, but I asked if I could give Dr Alwyn Ren his number so Dr Alwyn Ren could call him---Please call [phone] Initial call taken by: Jerolyn Shin,  April 16, 2010 4:51 PM  Follow-up for Phone Call        Copy CPX of 11/17 /2010 with labs & 12/20/2008  and attach to that form for him to pick up. Those records include all the  clinical information we have  typed out . [facility], as all surgical facilities  do,always request a current update of medical isses. There is no followup of medical problems since 04/26/2009 unless he were seen elsewhere in the interim. I recommended the office visit to enable him to be surgically cleared for the surgery.Otherwise I would expect this to be done in [place] which may delay his surgery. Follow-up by: Marga Melnick MD,  April 17, 2010 5:32 AM  Additional Follow-up for Phone Call Additional follow up Details #1::        I spoke to Dr Alwyn Ren about the conversation that I had with this patient and he asked me to enter the following in the notes:   When I  spoke to the patient on 04/16/2010, he  was extremely upset and used a great deal of profanity (numerous four letter words) when expressing his anger that this paperwork could not be completed.  He kept saying that his surgery was scheduled for next week and this paperwork had to be in --so surgery could be performed.  The patient kept arguing and arguing, so I finally said that I would give Dr Alwyn Ren his number so that he could discuss this with the patient.   Per Dr Frederik Pear request, I will get the [facility] paperwork from [facility] and then fax it as well as the office notes from 12/20/2008 and 04/26/2009 and the lab notes from 12/20/2008 to them on Wednesday 04/18/2010 .Marland KitchenJerolyn Shin  April 17, 2010 5:32 PM Additional Follow-up by: Jerolyn Shin,  April 17, 2010 5:32 PM

## 2010-07-10 NOTE — Assessment & Plan Note (Signed)
Summary: cpx/cbs   Vital Signs:  Patient profile:   65 year old male Height:      72.75 inches Weight:      220.2 pounds BMI:     29.36 Temp:     98.0 degrees F oral Pulse rate:   80 / minute Resp:     14 per minute BP sitting:   108 / 70  (left arm) Cuff size:   large  Vitals Entered By: Shonna Chock CMA (May 10, 2010 1:10 PM) CC: CPX, Abdominal pain   CC:  CPX and Abdominal pain.  History of Present Illness:       Roger Stewart had L 5/S 1 Foraminotomy , partial Facetectomy,Nerve Root decompression , & L4-5  destruction  via Thermal ablation of paravertebral  facet joint under Fluoroscopy 04/26/2010 in [place] ,[place]. He had extensive pre op medical evaluation with blood tests & imaging. Those records are pending. There has been no  sequellae since surgery but he believes he has developed a hernia 11/30 after slipping climbing into SUV. The patient denies nausea, vomiting, diarrhea, constipation, melena, and hematemesis.  The location of the pain is left lower quadrantinguinal area.  The pain is described as intermittent , dull & non radiating.  Associated symptoms include dysuria.  The patient denies the following symptoms: fever and dark urine.  The pain is worse with  urination , pressure & cough. Hypertension Follow-Up:       The patient denies lightheadedness, headaches, edema, and fatigue.  The patient denies the following associated symptoms: chest pain, chest pressure, exercise intolerance, dyspnea, palpitations, and syncope.  Compliance with medications (by patient report) has been near 100%.  The patient reports that dietary compliance has been excellent.  Adjunctive measures currently used by the patient include salt restriction.   BP has been < 135/85 on average. Hyperlipidemia Follow-Up:       He has been off statin 18 months.  The patient denies muscle aches, flushing, and itching.  Adjunctive measures currently used by the patient include fiber, ASA, fish oil supplements, and  Co-QA.  No FH of premature CAD/MI.   Current Medications (verified): 1)  Lisinopril 20 Mg Tabs (Lisinopril) .Marland Kitchen.. 1 By Mouth Qd 2)  Metoprolol Tartrate 25 Mg  Tabs (Metoprolol Tartrate) .Marland Kitchen.. 1 By Mouth Bid  Allergies (verified): No Known Drug Allergies  Past History:  Past Medical History: Skin cancer, PMH  of, Basal Cell , multiple recurrences , Dr Nita Sells Hyperlipidemia Hypertension AAA, S/P repair Low back pain, bulging disc @ L4-5 Colonic polyps, PMH  of 2002, negative 2007, Dr Madilyn Fireman Diverticulosis, colon 2007  Past Surgical History: Colonoscopy- 1 polyp (2002), tics (2005), Dr Dorena Cookey AAA repair (07/2003) ESI X 3 LS spine;  Extensive L-S surgery 04/2010 , [place], Surgery (see scanned  document)               Family History: Father: Prostate cancer ,  HTN, CVA Mother: Bypass surgery in 62s,  CHF Siblings: bro : HTN, Acromegaly  Social History: Alcohol use-yes: socially Occupation:Semi retired Former Smoker:  3-4 cigars / week Regular exercise-yes: Stationary bike 4X/ week ( interrupted due to LS spine surgery)  Physical Exam  General:  well-nourished,in no acute distress; alert,appropriate and cooperative throughout examination Eyes:  No corneal or conjunctival inflammation noted. No icterus Lungs:  Normal respiratory effort, chest expands symmetrically. Lungs : isolated wheeze LLL upon inspiration initially, not sustained. Heart:  Normal rate and regular rhythm. S1 and S2 normal without  gallop, murmur, click, rub . soft  S4 Abdomen:  Bowel sounds positive,abdomen soft and non-tender without masses, organomegaly. Small L direct hernia LLQ. Old mid line op scar Genitalia:  Testes bilaterally descended without nodularity, tenderness or masses. No scrotal masses or lesions. No penis lesions or urethral discharge. Msk:  No deformity or scoliosis noted of thoracic or lumbar spine.   Op sites w/o erythema; good eschar formation Pulses:  R and L carotid,radial,dorsalis  pedis and posterior tibial pulses are full and equal bilaterally Extremities:  No clubbing, cyanosis, edema.  Neg SLR Neurologic:  alert & oriented X3.   Skin:  Intact without suspicious lesions or rashes. See back Cervical Nodes:  No lymphadenopathy noted Axillary Nodes:  No palpable lymphadenopathy Psych:  memory intact for recent and remote, normally interactive, and good eye contact.     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 1 by mouth qd    Metoprolol Tartrate 25 Mg Tabs (Metoprolol tartrate) .Marland Kitchen... 1 by mouth bid  Problem # 5:  NEOPLASM, MALIGNANT, PROSTATE, FAMILY HX, FATHER (ICD-V16.42)  Complete Medication List: 1)  Lisinopril 20 Mg Tabs (Lisinopril) .Marland Kitchen.. 1 by mouth qd 2)  Metoprolol Tartrate 25 Mg Tabs (Metoprolol tartrate) .Marland Kitchen.. 1 by mouth bid  Patient Instructions: 1)   No lfting > 10 #. Report Warning Signs  as discussed.Fasting labs in am: 2)  BUN, creat, K+; 3)  Hepatic Panel ; 4)   [facility] Lipid Panel  ( 1304X) 5)  TSH ; 6)  PSA; 7)  HbgA1C.   Orders Added: 1)  Est. Patient Level IV [16109]     Appended Document: cpx/cbs   Immunizations Administered:  Tetanus Vaccine:    Vaccine Type: Tdap    Site: right deltoid    Mfr: GlaxoSmithKline    Dose: 0.5 ml    Route: IM    Given by: Shonna Chock CMA    Exp. Date: 03/29/2012    Lot #: [id number]    VIS given: 04/27/08 version given May 10, 2010.

## 2010-07-10 NOTE — Assessment & Plan Note (Signed)
Summary: cpx//tl   Vital Signs:  Patient Profile:   65 Years Old Male Height:     72.75 inches Weight:      228.0 pounds Temp:     98.4 degrees F oral Pulse rate:   60 / minute Resp:     14 per minute BP sitting:   124 / 86  (left arm) Cuff size:   large  Pt. in pain?   no  Vitals Entered By: Shonna Chock (December 24, 2007 10:44 AM)              Comments PATIENT TAKES GLUCOPHAGE WHEN EVER HE FEELS THAT HE NEEDS IT, NOT TAKEN AS PRESCRIBED./[facility]  December 24, 2007 10:47 AM      Chief Complaint:  CPX WITH FASTING LABS.  History of Present Illness: Lipid & HTN Management Panels reviewed . Only symptoms are intermittent dizziness & sweating . Some postural component; ? contribution of heat & ? hypoglycemia. Glucose was 68 mid morning, approx 2 hrs post b'fast. On [place] now; CVE as walking 2-3 mpd 3-4X/week w/o symptoms.Taking Metformin "when I'm bad" ( with  increased carb intake)  Hypertension History:      He complains of neurologic problems, but denies headache, chest pain, palpitations, dyspnea with exertion, orthopnea, PND, peripheral edema, visual symptoms, syncope, and side effects from treatment.  He notes no problems with any antihypertensive medication side effects.  Further comments include: BP @ home 114/75-80.        Positive major cardiovascular risk factors include male age 43 years old or older, hyperlipidemia, hypertension, and current tobacco user.  Negative major cardiovascular risk factors include no history of diabetes and negative family history for ischemic heart disease.        Positive history for target organ damage include peripheral vascular disease.  Further assessment for target organ damage reveals no history of ASHD or stroke/TIA.    Lipid Management History:      Positive NCEP/ATP III risk factors include male age 82 years old or older, current tobacco user, hypertension, peripheral vascular disease, and aortic aneurysm.  Negative NCEP/ATP III  risk factors include non-diabetic, no family history for ischemic heart disease, no ASHD (atherosclerotic heart disease), and no prior stroke/TIA.        Current Allergies (reviewed today): No known allergies   Past Medical History:    Skin cancer, hx of    Hyperlipidemia    Hypertension    AAA, S/P repair    Low back pain, bulging disc @ L4-5   Family History:    Father: Prostate CA,  HTN, CVA    Mother: Bypass sx.,  onset 72, CHF    Siblings: bro HTN, Acromegaly  Social History:    Alcohol use-yes   Risk Factors:  Tobacco use:  current    Cigars:  Yes -- 2 per week Exercise:  yes    Times per week:  3-4    Type:  WALKING,WEIGHTS  Family History Risk Factors:    Family History of MI in females < 23 years old:  no    Family History of MI in males < 70 years old:  no   Review of Systems  General      Denies chills, fatigue, fever, sleep disorder, sweats, and weight loss.  Eyes      Denies blurring, double vision, and vision loss-both eyes.  ENT      Complains of nasal congestion and sinus pressure.  Denies decreased hearing, difficulty swallowing, earache, and ringing in ears.      Seasonal symptoms  CV      Denies bluish discoloration of lips or nails and leg cramps with exertion.  Resp      Denies cough, excessive snoring, hypersomnolence, morning headaches, shortness of breath, and sputum productive.  GI      Denies abdominal pain, bloody stools, change in bowel habits, constipation, dark tarry stools, diarrhea, indigestion, nausea, and vomiting.  GU      Complains of erectile dysfunction.      Denies decreased libido, dysuria, hematuria, and nocturia.      Cialis effective  MS      Complains of low back pain.      Denies joint pain, joint redness, joint swelling, muscle aches, and muscle weakness.  Derm      Denies changes in nail beds, dryness, hair loss, lesion(s), and rash.      Seen annually by Derm, Dr Margo Aye  Neuro      Denies  numbness and tingling.      Sciatica LLE with LB pain  Psych      Denies anxiety, depression, easily angered, easily tearful, and irritability.  Endo      Complains of heat intolerance.      Denies cold intolerance, excessive hunger, excessive thirst, excessive urination, polyuria, and weight change.  Heme      Denies abnormal bruising and bleeding.  Allergy      Complains of itching eyes, seasonal allergies, and sneezing.      Zyrtec as needed     Physical Exam  General:     Well-developed,well-nourished,in no acute distress; alert,appropriate and cooperative throughout examination; mildly uncomfortable-appearing due to back.   Head:     Normocephalic and atraumatic without obvious abnormalities. No apparent alopecia or balding. Eyes:     No corneal or conjunctival inflammation noted.Marland Kitchen Perrla. Funduscopic exam benign, without hemorrhages, exudates or papilledema. Ears:     External ear exam shows no significant lesions or deformities.  Otoscopic examination reveals  wax bilat . Hearing is grossly normal bilaterally. Nose:     External nasal examination shows post op scar  . Nasal mucosa are pink and moist without lesions or exudates. Mouth:     Oral mucosa and oropharynx without lesions or exudates.  Teeth in good repair; upper partial. Lungs:     Normal respiratory effort, chest expands symmetrically. Lungs are clear to auscultation, no crackles or wheezes. Heart:     Normal rate and regular rhythm. S1 and S2 normal without gallop, murmur, click, rub. S4 Abdomen:     Op scar  Rectal:     External tags noted. Normal sphincter tone. No rectal masses or tenderness.FOB neg Genitalia:     Testes bilaterally descended without nodularity, tenderness or masses. Varicocoele on L. No penis lesions or urethral discharge. Prostate:     Prostate gland ULN,firm and smooth, no enlargement, nodularity, tenderness, mass, asymmetry or induration. Msk:     Rigid posture Pulses:     R  and L carotid,radial,dorsalis pedis and posterior tibial pulses are full and equal bilaterally Extremities:     No clubbing, cyanosis, edema, or deformity noted with normal full range of motion of all joints.   Neurologic:     alert & oriented X3, strength normal in all extremities, and DTRs symmetrical and normal.   Skin:     Intact without suspicious lesions or rashes Cervical Nodes:     No  lymphadenopathy noted Axillary Nodes:     No palpable lymphadenopathy Psych:     memory intact for recent and remote, normally interactive, good eye contact, not anxious appearing, and not depressed appearing.      Impression & Recommendations:  Problem # 1:  ROUTINE GENERAL MEDICAL EXAM@HEALTH  CARE FACL (ICD-V70.0)  Orders: EKG w/ Interpretation (93000) TLB-Lipid Panel (80061-LIPID) TLB-BMP (Basic Metabolic Panel-BMET) (80048-METABOL) TLB-CBC Platelet - w/Differential (85025-CBCD) TLB-Hepatic/Liver Function Pnl (80076-HEPATIC) TLB-TSH (Thyroid Stimulating Hormone) (84443-TSH) TLB-A1C / Hgb A1C (Glycohemoglobin) (83036-A1C) TLB-PSA (Prostate Specific Antigen) (84153-PSA)   Problem # 2:  LOW BACK PAIN (ICD-724.2) as per Dr Georgiann Mccoy His updated medication list for this problem includes:    Adult Aspirin Ec Low Strength 81 Mg Tbec (Aspirin) .Marland Kitchen... 1 by mouth once daily   Problem # 3:  HYPERLIPIDEMIA (ICD-272.4)  His updated medication list for this problem includes:    Vytorin 10-20 Mg Tabs (Ezetimibe-simvastatin) .Marland Kitchen... 1 by mouth once daily  Orders: TLB-Lipid Panel (80061-LIPID)   Problem # 4:  HYPERTENSION (ICD-401.9)  His updated medication list for this problem includes:    Lisinopril 20 Mg Tabs (Lisinopril) .Marland Kitchen... 1 by mouth qd    Metoprolol Tartrate 25 Mg Tabs (Metoprolol tartrate) .Marland Kitchen... 1 by mouth bid  Orders: EKG w/ Interpretation (93000)   Problem # 5:  METABOLIC SYNDROME X (ICD-277.7)  Orders: TLB-A1C / Hgb A1C (Glycohemoglobin) (83036-A1C)   Problem # 6:  SKIN  CANCER, HX OF (ICD-V10.83) as per Dr Margo Aye  Complete Medication List: 1)  Lisinopril 20 Mg Tabs (Lisinopril) .Marland Kitchen.. 1 by mouth qd 2)  Glucophage 500 Mg Tabs (Metformin hcl) .Marland Kitchen.. 1 by mouth daily with the largest meal 3)  Vytorin 10-20 Mg Tabs (Ezetimibe-simvastatin) .Marland Kitchen.. 1 by mouth once daily 4)  Adult Aspirin Ec Low Strength 81 Mg Tbec (Aspirin) .Marland Kitchen.. 1 by mouth once daily 5)  Metoprolol Tartrate 25 Mg Tabs (Metoprolol tartrate) .Marland Kitchen.. 1 by mouth bid  Hypertension Assessment/Plan:      The patient's hypertensive risk group is category C: Target organ damage and/or diabetes.  His calculated 10 year risk of coronary heart disease is 11 %.  Today's blood pressure is 124/86.    Lipid Assessment/Plan:      Based on NCEP/ATP III, the patient's risk factor category is "history of coronary disease, peripheral vascular disease, cerebrovascular disease, or aortic aneurysm".  The patient's lipid goals have been set as follows: Total cholesterol goal is 200; LDL cholesterol goal is 100; HDL cholesterol goal is 40; Triglyceride goal is 150.  His LDL cholesterol goal has been met.     Patient Instructions: 1)  Complete stool cards. 2)  Stop Smoking Tips: Choose a Quit date. Cut down before the Quit date. decide what you will do as a substitute when you feel the urge to smoke(gum,toothpick,exercise).   Prescriptions: METOPROLOL TARTRATE 25 MG  TABS (METOPROLOL TARTRATE) 1 by mouth bid  #180 x 3   Entered and Authorized by:   Marga Melnick MD   Signed by:   Marga Melnick MD on 12/24/2007   Method used:   Print then Give to Patient   RxID:   [id number] VYTORIN 10-20 MG  TABS (EZETIMIBE-SIMVASTATIN) 1 by mouth once daily  #90 x 3   Entered and Authorized by:   Marga Melnick MD   Signed by:   Marga Melnick MD on 12/24/2007   Method used:   Print then Give to Patient   RxID:   [id number] GLUCOPHAGE 500 MG  TABS (METFORMIN HCL)  1 by mouth daily with the largest meal  #90 x 1   Entered and  Authorized by:   Marga Melnick MD   Signed by:   Marga Melnick MD on 12/24/2007   Method used:   Print then Give to Patient   RxID:   [id number] LISINOPRIL 20 MG TABS (LISINOPRIL) 1 by mouth qd  #90 Each x 3   Entered and Authorized by:   Marga Melnick MD   Signed by:   Marga Melnick MD on 12/24/2007   Method used:   Print then Give to Patient   RxID:   [id number]  ]

## 2010-07-10 NOTE — Assessment & Plan Note (Signed)
Summary: cpx   Vital Signs:  Patient profile:   65 year old male Height:      72.5 inches Weight:      220.4 pounds BMI:     29.59 Temp:     98.3 degrees F oral Pulse rate:   76 / minute Resp:     14 per minute BP sitting:   108 / 70  (left arm) Cuff size:   large  Vitals Entered By: Shonna Chock (December 20, 2008 11:07 AM) CC: CPX WITH FASTING LABS ***PATIENT OFF METFORMIN X 6 MONTHS, TAKES VYTORIN OFF/ON*** Comments REVIEWED MED LIST, PATIENT AGREED DOSE AND INSTRUCTION CORRECT    CC:  CPX WITH FASTING LABS ***PATIENT OFF METFORMIN X 6 MONTHS and TAKES VYTORIN OFF/ON***.  History of Present Illness: Flare of sciatica in [place] in 01/10; unable to walk beyond 1/2 mile due to sciatica. Chiropractor, Dr Georgiann Mccoy ,& Acupuncturist , Dr Excell Seltzer in [place] are treating it with decreased pain. Recent diarrhea essentially resolved now but slight tenderness LLQ.  Preventive Screening-Counseling & Management  Alcohol-Tobacco     Smoking Status: quit  Caffeine-Diet-Exercise     Does Patient Exercise: yes  Allergies (verified): No Known Drug Allergies  Past History:  Past Medical History: Skin cancer, hx of, Basal Cell , multiple Hyperlipidemia Hypertension AAA, S/P repair Low back pain, bulging disc @ L4-5 Colonic polyps, hx of 2002 Diverticulosis, colon  Past Surgical History: Colonoscopy- 1 polyp (2002), tics (2005), Dr Madilyn Fireman AAA repair (07/2003)               Family History: Father: Prostate CA,  HTN, CVA Mother: Bypass surgery in 53s,  CHF Siblings: bro HTN, Acromegaly  Social History: Alcohol use-yes: socially Occupation:Semi retired Former Smoker: quit 2004 Regular exercise-yes: Stationary bike 4X/ week Smoking Status:  quit  Review of Systems General:  Denies chills, fever, and sweats; weight down 5# with low carb. Eyes:  Denies blurring, double vision, and vision loss-both eyes. ENT:  Denies difficulty swallowing. CV:  Denies chest pain or discomfort, leg  cramps with exertion, palpitations, and shortness of breath with exertion. Resp:  Denies cough and sputum productive. GI:  Complains of gas, indigestion, and nausea; denies bloody stools, constipation, dark tarry stools, and vomiting; Diarrhea X 2 weeks with cramps. GU:  Denies discharge, dysuria, hematuria, and incontinence. MS:  See HPI; Complains of low back pain; denies joint pain, joint redness, joint swelling, mid back pain, and thoracic pain. Derm:  Denies changes in nail beds, dryness, hair loss, and lesion(s). Neuro:  Complains of numbness and tingling; denies brief paralysis; LLE weakness ; N&T LLE in calf. No perineal paresthesias. Psych:  Denies anxiety and depression. Endo:  Denies cold intolerance, excessive hunger, excessive thirst, excessive urination, and heat intolerance. Heme:  Denies abnormal bruising and bleeding. Allergy:  Complains of seasonal allergies; denies itching eyes and sneezing.  Physical Exam  General:  well-nourished,in no acute distress; alert,appropriate and cooperative throughout examination Head:  Normocephalic and atraumatic without obvious abnormalities. No apparent alopecia  Eyes:  No corneal or conjunctival inflammation noted.Perrla. Funduscopic exam benign, without hemorrhages, exudates or papilledema.  Ears:  External ear exam shows no significant lesions or deformities.  Otoscopic examination reveals  wax bilaterally. Hearing is grossly normal bilaterally. Nose:  External nasal examination shows no deformity or inflammation, op scar present. Nasal mucosa are pink and moist without lesions or exudates. Mouth:  Oral mucosa and oropharynx without lesions or exudates.  Upper plate Neck:  No deformities, masses,  or tenderness noted. Lungs:  Normal respiratory effort, chest expands symmetrically. Lungs are clear to auscultation, no crackles or wheezes. Heart:  Normal rate and regular rhythm. S1 and S2 normal without gallop, murmur, click, rub. S4 Abdomen:   Bowel sounds positive,abdomen soft  but slightly tender LLQ  without masses, organomegaly or hernias noted. Rectal:  Minor SQ papule R perirectal area9 Non malignant in appearance)Normal sphincter tone. No rectal masses or tenderness. Genitalia:  Testes bilaterally descended without nodularity, tenderness or masses. No scrotal masses or lesions. No penis lesions or urethral discharge. L varicocele.   Prostate:  Prostate gland firm and smooth, no enlargement, nodularity, tenderness, mass, asymmetry or induration. Msk:  No deformity or scoliosis noted of thoracic or lumbar spine.   Pulses:  R and L carotid,radial,dorsalis pedis and posterior tibial pulses are full and equal bilaterally Extremities:  No clubbing, cyanosis, edema, or deformity noted with normal full range of motion of all joints.   Neg SLR bilat Neurologic:  alert & oriented X3, strength normal in all extremities, gait normal, and DTRs symmetrical and normal.   Skin:  Intact without suspicious lesions or rashes Cervical Nodes:  No lymphadenopathy noted Axillary Nodes:  No palpable lymphadenopathy Psych:  memory intact for recent and remote, normally interactive, and good eye contact.     Impression & Recommendations:  Problem # 1:  ROUTINE GENERAL MEDICAL EXAM@HEALTH  CARE FACL (ICD-V70.0)  Orders: EKG w/ Interpretation (93000) Venipuncture (96045) TLB-Lipid Panel (80061-LIPID) TLB-BMP (Basic Metabolic Panel-BMET) (80048-METABOL) TLB-CBC Platelet - w/Differential (85025-CBCD) TLB-Hepatic/Liver Function Pnl (80076-HEPATIC) TLB-TSH (Thyroid Stimulating Hormone) (84443-TSH) TLB-PSA (Prostate Specific Antigen) (84153-PSA) TLB-A1C / Hgb A1C (Glycohemoglobin) (83036-A1C)  Problem # 2:  DIARRHEA (ICD-787.91)  essen resolved  Orders: Venipuncture (40981) TLB-CBC Platelet - w/Differential (85025-CBCD)  Problem # 3:  ABDOMINAL PAIN (ICD-789.00)  probably from diverticulitis  Orders: Venipuncture (19147) TLB-CBC Platelet  - w/Differential (85025-CBCD)  Problem # 4:  DIVERTICULOSIS, COLON (ICD-562.10)  PMH of   Orders: Venipuncture (82956)  Problem # 5:  HYPERTENSION (ICD-401.9)  His updated medication list for this problem includes:    Lisinopril 20 Mg Tabs (Lisinopril) .Marland Kitchen... 1 by mouth qd    Metoprolol Tartrate 25 Mg Tabs (Metoprolol tartrate) .Marland Kitchen... 1 by mouth bid  Orders: EKG w/ Interpretation (93000) Venipuncture (21308)  Problem # 6:  HYPERLIPIDEMIA (ICD-272.4)  His updated medication list for this problem includes:    Vytorin 10-20 Mg Tabs (Ezetimibe-simvastatin) .Marland Kitchen... 1 by mouth once daily  Orders: Venipuncture (65784) TLB-Lipid Panel (80061-LIPID)  Complete Medication List: 1)  Lisinopril 20 Mg Tabs (Lisinopril) .Marland Kitchen.. 1 by mouth qd 2)  Vytorin 10-20 Mg Tabs (Ezetimibe-simvastatin) .Marland Kitchen.. 1 by mouth once daily 3)  Adult Aspirin Ec Low Strength 81 Mg Tbec (Aspirin) .Marland Kitchen.. 1 by mouth once daily 4)  Metoprolol Tartrate 25 Mg Tabs (Metoprolol tartrate) .Marland Kitchen.. 1 by mouth bid 5)  Ciprofloxacin Hcl 500 Mg Tabs (Ciprofloxacin hcl) .Marland Kitchen.. 1 two times a day  Patient Instructions: 1)  Have Dr Margo Aye assess perirectal papule. Report Warning Signs as discussed. (Note : off Metformin X 6 months) Prescriptions: CIPROFLOXACIN HCL 500 MG TABS (CIPROFLOXACIN HCL) 1 two times a day  #14 x 0   Entered and Authorized by:   Marga Melnick MD   Signed by:   Marga Melnick MD on 12/20/2008   Method used:   Print then Give to Patient   RxID:   [id number] METOPROLOL TARTRATE 25 MG  TABS (METOPROLOL TARTRATE) 1 by mouth bid  #180 x 3  Entered and Authorized by:   Marga Melnick MD   Signed by:   Marga Melnick MD on 12/20/2008   Method used:   Print then Give to Patient   RxID:   [id number] VYTORIN 10-20 MG  TABS (EZETIMIBE-SIMVASTATIN) 1 by mouth once daily  #90 x 3   Entered and Authorized by:   Marga Melnick MD   Signed by:   Marga Melnick MD on 12/20/2008   Method used:   Print then Give to  Patient   RxID:   [id number] LISINOPRIL 20 MG TABS (LISINOPRIL) 1 by mouth qd  #90 Each x 3   Entered and Authorized by:   Marga Melnick MD   Signed by:   Marga Melnick MD on 12/20/2008   Method used:   Print then Give to Patient   RxID:   [id number]

## 2010-07-10 NOTE — Assessment & Plan Note (Signed)
Summary: nerve block evaluation//fd   Vital Signs:  Patient profile:   65 year old male Weight:      227.2 pounds Pulse rate:   64 / minute Resp:     15 per minute BP sitting:   112 / 64  (left arm) Cuff size:   large  Vitals Entered By: Shonna Chock (April 26, 2009 11:40 AM) CC: Follow-Up visit: patient would like to discuss nerve block evaluation Comments REVIEWED MED LIST, PATIENT AGREED DOSE AND INSTRUCTION CORRECT    CC:  Follow-Up visit: patient would like to discuss nerve block evaluation.  History of Present Illness: LE L5 - S1 radicular pain > 1 year  w/o trigger or injury. Dr Allena Katz in [place] ,[place] recommended [facility] after failure of spinal decompression , Acupuncture , Physical Therapy to alleviate symptoms for any significant period.Worse with golf, standing for > a few minutes or walking; better sitting or squatting. MRI L5-S1 stenosis due to osteophytes. [facility] records reviewed)  Allergies (verified): No Known Drug Allergies  Review of Systems General:  Weight loss on purpose. GU:  Denies incontinence. Neuro:  Complains of numbness, tingling, and weakness; denies brief paralysis.  Physical Exam  General:  well-nourished,in no acute distress; alert,appropriate and cooperative throughout examination Abdomen:  Bowel sounds positive,abdomen soft and non-tender without masses, organomegaly or hernias noted. Msk:  Straightening LS spine Extremities:  Neg SLR bilaterally but pin L hamstring Neurologic:  strength normal in lower extremities and DTRs symmetrical but 0-1/2+ Skin:  Intact without suspicious lesions or rashes   Impression & Recommendations:  Problem # 1:  LUMBAR RADICULOPATHY, LEFT (ICD-724.4)  The following medications were removed from the medication list:    Adult Aspirin Ec Low Strength 81 Mg Tbec (Aspirin) .Marland Kitchen... 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 1 by mouth qd 2)  Metoprolol Tartrate 25 Mg Tabs (Metoprolol tartrate) .Marland Kitchen.. 1 by mouth bid 3)  Gabapentin 100 Mg Caps (Gabapentin) .Marland Kitchen.. 1 q 8 hrs as needed for pain in leg  Patient Instructions: 1)  Check your blood sugars regularly. If your readings are usually above :130  after the ESI treatments Prescriptions: GABAPENTIN 100 MG CAPS (GABAPENTIN) 1 q 8 hrs as needed for pain in leg  #30 x 5   Entered and Authorized by:   Marga Melnick MD   Signed by:   Marga Melnick MD on 04/26/2009   Method used:   Faxed to ...       [facility] [address] [id number]* (retail)       [address]       [place], [place]  [address]       Ph: [phone]       Fax: [phone]   RxID:   [id number]   Appended Document: nerve block evaluation//fd [facility] has requested pre op evaluation with pre op labs & EKG.. To complete this he should schedule appt. He should come in fasting if he wants to pursue this.Appt must be within 6 weeks of the planned surgery

## 2010-07-12 NOTE — Letter (Signed)
Summary: [facility]  [facility]   Imported By: Lanelle Bal 06/06/2010 09:05:34  _____________________________________________________________________  External Attachment:    Type:   Image     Comment:   External Document

## 2010-07-12 NOTE — Letter (Signed)
Summary: [facility]  [facility]   Imported By: Lanelle Bal 06/06/2010 09:08:38  _____________________________________________________________________  External Attachment:    Type:   Image     Comment:   External Document

## 2010-07-12 NOTE — Letter (Signed)
Summary: [facility]  [facility]   Imported By: Lanelle Bal 06/06/2010 09:06:20  _____________________________________________________________________  External Attachment:    Type:   Image     Comment:   External Document

## 2010-07-12 NOTE — Op Note (Signed)
Summary: Back Surgery/[facility]  Back Surgery/[facility]   Imported By: Lanelle Bal 06/06/2010 09:09:58  _____________________________________________________________________  External Attachment:    Type:   Image     Comment:   External Document

## 2010-08-09 HISTORY — PX: LUMBAR SPINE SURGERY: SHX701

## 2010-10-26 NOTE — Op Note (Signed)
NAME:  Roger Stewart, Roger Stewart                          ACCOUNT NO.:  [id number]   MEDICAL RECORD NO.:  [id number]                   PATIENT TYPE:  AMB   LOCATION:  ENDO                                 FACILITY:  [facility]   PHYSICIAN:  John C. Madilyn Fireman, M.D.                 DATE OF BIRTH:  01/13/1946   DATE OF PROCEDURE:  11/29/2003  DATE OF DISCHARGE:                                 OPERATIVE REPORT   PROCEDURE:  Colonoscopy.   INDICATIONS FOR PROCEDURE:  History of adenomatous colon polyps on initial  colonoscopy three years ago.   DESCRIPTION OF PROCEDURE:  The patient was placed in the left lateral  decubitus position then placed on the pulse monitor with continuous low flow  oxygen delivered by nasal cannula. He was sedated with 75 mcg IV fentanyl  and 9 mg IV Versed. The Olympus video colonoscope was inserted into the  rectum and advanced to the cecum, confirmed by transillumination at  McBurney's point and visualization of the ileocecal valve and appendiceal  orifice. The prep was excellent. The cecum, ascending, and transverse colon  all appeared normal with no masses, polyps, diverticula or other mucosal  abnormalities. Within the descending and sigmoid colon, there were noted to  be several diverticula and no other abnormalities.  The rectum appeared  normal and retroflexed view of the anus revealed no obvious internal  hemorrhoids. The scope was then withdrawn and the patient returned to the  recovery room in stable condition. He tolerated the procedure well and there  were no immediate complications.   IMPRESSION:  Diverticulosis otherwise normal study.   PLAN:  Next colonoscopy in five years.                                               John C. Madilyn Fireman, M.D.    JCH/MEDQ  D:  11/29/2003  T:  11/29/2003  Job:  [id number]   cc:   Titus Dubin. Alwyn Ren, M.D. [facility]

## 2010-10-26 NOTE — H&P (Signed)
NAME:  Roger Stewart, Roger Stewart                          ACCOUNT NO.:  [id number]   MEDICAL RECORD NO.:  [id number]                   PATIENT TYPE:  OIB   LOCATION:  NA                                   FACILITY:  [facility]   PHYSICIAN:  Di Kindle. Edilia Bo, M.D.        DATE OF BIRTH:  1946/05/28   DATE OF ADMISSION:  07/14/2003  DATE OF DISCHARGE:                                HISTORY & PHYSICAL   PREOPERATIVE HISTORY AND PHYSICAL:   REASON FOR ADMISSION:  Abdominal aortic aneurysm.   HISTORY OF PRESENT ILLNESS:  This is a pleasant 65 year old gentleman who  was found on physical examination by Dr. Alwyn Ren to have an abdominal aortic  aneurysm.  A  CAT scan was obtained which confirmed a 5.1 cm infrarenal  abdominal aortic aneurysm.  In addition, he was noted to have some moderate  sigmoid diverticulosis.  The patient denies any history of abdominal pain or  back pain.  I had seen him in consultation on June 29, 2003 and  recommended arteriography and preoperative cardiac evaluation in order to  plan elective repair.   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  In addition, both the CAT scan and the  arteriogram show that the diameter of the neck of aneurysm is 28-30 mm and  therefore, he is not a candidate for endovascular repair of his aneurysm as  the currently available grafts are not large enough to accommodate this size  neck.  Therefore, he is being admitted for open elective repair.   PAST MEDICAL HISTORY:  1. Hypertension.  2. The patient denies any history of diabetes, hypercholesterolemia, history     of previous myocardial infarction, or history of congestive heart     failure.  He has no history of COPD or emphysema.   FAMILY HISTORY:  His mother had a coronary artery bypass operation at age  84.  He is unaware of any history of premature  cardiovascular disease.   SOCIAL HISTORY:  He is married and has four children.  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  He quit tobacco 1 month ago.  He does drink  two to three drinks of alcohol a day.   ALLERGIES:  No known drug allergies.   MEDICINES:  Toprol XL 50 mg p.o. once daily.   REVIEW OF SYSTEMS:  GENERAL:  He has had no weight loss, weight gain, or  problems with his appetite.  CARDIAC:  He has had no chest pain, chest  pressure, orthopnea, palpitations, arrhythmias, or dyspnea on exertion.  PULMONARY:  He has no history of bronchitis, asthma, or wheezing.  GI:  He  has had no recent change in his bowel habits and has no history of peptic  ulcer disease or reflux.  GU:  He has had no dysuria or  frequency.  VASCULAR:  The patient denies any history of claudication, rest pain, or  nonhealing ulcers.  He has had no previous history of stroke, TIAs,  expressive or receptive aphasia, or amaurosis fugax.  He has had no history  of DVT or phlebitis.  NEURO:  He has had no dizziness, blackouts, headaches,  or seizures.  ORTHO/SKIN:  He has had no arthritis, joint pain, muscle pain,  or rash.  PSYCHIATRIC:  He has had no history of depression or nervousness.  HEME:  He has had no bleeding problems or clotting disorders that he is  aware of.   PHYSICAL EXAMINATION:  VITAL SIGNS:  Blood pressure is 106/60, heart rate is  76.  I do not detect any carotid bruits.  LUNGS:  Clear bilaterally to auscultation.  CARDIAC:  He has a regular rate and rhythm.  ABDOMEN:  Soft and nontender.  His aneurysm is palpable and nontender.  EXTREMITIES:  He has palpable femoral, popliteal and pedal pulses  bilaterally.  He has no significant lower extremity swelling.  There is no  evidence of atheroembolic disease.  NEUROLOGIC:  Nonfocal.   DIAGNOSTIC STUDY:  CT scan shows a 5.1 cm infrarenal abdominal aortic  aneurysm with some mild to moderate diverticular disease.   IMPRESSION AND  PLAN:  Given the size of the aneurysm, I have recommended  elective repair as the risk of rupture associated with an aneurysm of this  size is probably 7% per year.  We have discussed the indications for repair  and the potential complications of surgery including but not limited to  bleeding, wound healing problems, graft infection, kidney failure,  myocardial infarction, stroke or other unpredictable medical problems.  All  of his questions were answered and he is agreeable to proceed.  His surgery  has been scheduled for July 14, 2003.   He has undergone a preoperative cardiac evaluation by Dr. Elease Hashimoto and the  results of this are currently pending.                                                Di Kindle. Edilia Bo, M.D.    CSD/MEDQ  D:  07/06/2003  T:  07/06/2003  Job:  [id number]   cc:   Titus Dubin. Alwyn Ren, M.D. [facility]   Vesta Mixer, M.D.  [address]  [place]  [place] [address]  Fax: [phone]

## 2010-10-26 NOTE — Procedures (Signed)
[place]  Patient:    Roger Stewart, Roger Stewart                       MRN: [id number] Proc. Date: 08/13/00 Adm. Date:  [id number] Attending:  Louie Bun CC:         Georg Ruddle. Viviann Spare, M.D.   Procedure Report  PROCEDURE:  Colonoscopy with polypectomy.  INDICATION FOR PROCEDURE:  Occasional rectal bleeding in a 65 year old patient with no prior colon screening.  DESCRIPTION OF PROCEDURE:  The patient was placed in the left lateral decubitus position and placed on a pulse monitor with continuous low-flow oxygen delivered by nasal cannula.  He was sedated with 100 mg IV Demerol and 7 mg IV Versed.  The Olympus video colonoscope was inserted into the rectum and advanced to the cecum, confirmed by transillumination at McBurneys point and visualization of the ileocecal valve and appendiceal orifice.  The prep was good.  The cecum, ascending, transverse, and descending colon appeared normal.  There were no masses, polyps, diverticula, or other mucosal abnormalities.  There were numerous diverticula seen in the sigmoid colon, and in the rectum there was a 6 mm polyp at approximately 12 cm that was removed by hot biopsy.  The colonoscope was then withdrawn and the patient returned to the recovery room in stable condition.  He tolerated the procedure well, and there were no apparent complications.  IMPRESSION: 1. Small rectal polyp. 2. Sigmoid diverticulosis.  PLAN:  Await histology for determination of method and interval for future colon screening. DD:  08/13/00 TD:  08/13/00 Job: [id number]

## 2010-10-26 NOTE — Discharge Summary (Signed)
NAME:  Roger Stewart, Roger Stewart                          ACCOUNT NO.:  [id number]   MEDICAL RECORD NO.:  [id number]                   PATIENT TYPE:  INP   LOCATION:  2040                                 FACILITY:  [facility]   PHYSICIAN:  Di Kindle. Edilia Bo, M.D.        DATE OF BIRTH:  [birth year]   DATE OF ADMISSION:  07/14/2003  DATE OF DISCHARGE:  07/19/2003                                 DISCHARGE SUMMARY   The patient tolerated the procedure well, was hemodynamically stable  postoperatively.  The patient was transferred to the post anesthesia care  unit in stable condition.  The patient was extubated without problems and  woke up from anesthesia ___________ intact.  The patient's postoperative  course progressed as expected.  ___________.  Postoperative day #1  ____________.  Postoperative day #3 the patient's incision ____________.  Postoperative day #4 the patient was doing well, tolerating diet, ambulating  in the halls.  The patient was afebrile.  Vital signs were stable.  The  wounds were clean and dry with no erythema present.  Abdomen was soft,  nontender, nondistended.  Positive bowel sounds present.  Extremities well  perfused.  Cardiac was regular rate and rhythm.  Lungs were clear.  The  patient was felt to be ready for discharge.   LABORATORY DATA:  CBC on July 18, 2003 showed white count 5.8, hemoglobin  10.9, hematocrit 32.7, platelets 158.  BMP on July 15, 2003 showed sodium  138, potassium 4.3, BUN 16, creatinine 1, glucose 163, AST 22, ALT 20, alk  phos 65, total bilirubin 0.6.   CONDITION ON DISCHARGE:  Improved.   DISCHARGE MEDICATIONS:  1. Resume Toprol XL 50 mg one p.o. daily.  2. Colace 100 mg two tablets p.o. daily.  3. Pain management - Tylox one to two p.o. q.4-6h. p.r.n.   ACTIVITY:  No driving, no strenuous activity.  The patient is to continue  daily breathing and walking exercises.   DIET:  Low-salt, low-fat, low-cholesterol.   WOUND CARE:  The  patient may shower daily and clean the incision with soap  and water.   SPECIAL INSTRUCTIONS:  If the incision becomes red, swollen or draining or  if the patient has a fever of 101, please call the surgeon's office at  ____________.   FOLLOW UP:  1. Followup appointment with Dr. Edilia Bo August 10, 2003 at 10:20 a.m.  2. Dr. Elease Hashimoto, cardiologist.  The patient to call him to make a necessary     followup appointment.  3. Primary care physician.  The patient will be advised to make an     appointment with that physician for followup for elevated blood sugars     while he was in the hospital.     Pecola Leisure, PA                      Di Kindle. Edilia Bo, M.D.   AY/MEDQ  D:  07/18/2003  T:  07/19/2003  Job:  [id number]

## 2010-10-26 NOTE — Op Note (Signed)
NAME:  Roger Stewart, Roger Stewart                          ACCOUNT NO.:  [id number]   MEDICAL RECORD NO.:  [id number]                   PATIENT TYPE:  INP   LOCATION:  2304                                 FACILITY:  [facility]   PHYSICIAN:  Di Kindle. Edilia Bo, M.D.        DATE OF BIRTH:  [birth year]   DATE OF PROCEDURE:  07/14/2003  DATE OF DISCHARGE:                                 OPERATIVE REPORT   PREOPERATIVE DIAGNOSIS:  5.1 cm abdominal aortic aneurysm.   POSTOPERATIVE DIAGNOSIS:  5.1 cm abdominal aortic aneurysm.   PROCEDURE:  Repair of abdominal aortic aneurysm with 22 mm tube graft.   SURGEON:  Di Kindle. Edilia Bo, M.D.   ASSISTANT:  Pecola Leisure, PA   ANESTHESIA:  General.   INDICATIONS FOR PROCEDURE:  This is a 65 year old gentleman who was found on  examination by Dr. Alwyn Ren to have an abdominal aortic aneurysm.  CAT scan  confirmed that he had a 5.1 cm infrarenal abdominal aortic aneurysm.  He  underwent preoperative cardiac workup by Dr. Elease Hashimoto and an arteriogram.  The  arteriogram and CAT scan showed that the diameter of the proximal neck was  too large to be considered for a stent graft, therefore, open repair was  recommended.  The procedure and potential complications were discussed with  the patient and his wife in detail.  All their questions were answered and  he was agreeable to proceed.   SURGICAL TECHNIQUE:  The patient was taken to the operating room after a  Swan-Ganz catheter and arterial line were placed by anesthesia.  The abdomen  and groins were prepped and draped in the usual sterile fashion.  He had  received a general anesthetic.  The abdomen was entered through a midline  incision.  Upon careful exploration, no other intra-abdominal pathology was  noted except for some diverticular disease in the colon.  The transverse  colon was reflected superiorly and the small bowel reflected to the right.  The retroperitoneal tissue was divided.  The aneurysm  was exposed up to the  neck and then up to the level of the left renal vein.  Of note, the diameter  of the neck was fairly generous.  There was a large amount of lymphatic  tissue which was ligated with 5-0 Prolenes and silks.  Once the proximal  neck was controlled with an umbilical tape, the tissue was divided down to  the level of the bifurcation.  The right proximal common iliac artery was  exposed such that a Henley clamp could be placed.  Next, the left common  iliac artery was controlled trying to preserve the sympathetic nerves which  crossed proximally.  The IMA was ligated between 2-0 silk ties.  The patient  was then heparinized and also received 25 grams of Mannitol.  The aneurysm  was then opened and several lumbars were oversewn with 2-0 silk Prolenes.  The neck was divided proximally circumferentially.  A 22  mm tube graft was  then brought to the field and using a felt cuff was sewn end-to-end to the  infrarenal aorta using 3-0 Prolene suture.  Given that the neck was slightly  ectatic, I did place a 24 mm cuff of graft over the 22 mm tube graft so that  it could be placed over the more proximal aorta at the completion.  The  anastomosis was then tested and was hemostatic and the aorta was then  reclamped and irrigated.  The graft was then pulled to the appropriate  length for anastomosis to the aortic bifurcation.  This anastomosis was done  end-to-end with continuous 3-0 Prolene suture.  Prior to completing  anastomosis, the vessels back bled and flushed appropriately, and the  anastomosis completed.  Flow was re-established to both legs which the  patient tolerated from a hemodynamic standpoint.  Next, I reclamped the  infrarenal aorta and slid the 24 mm cuff up to protect the anastomosis and  extend up higher up to the level of the infrarenal on the right.  Hemostasis  was obtained and then the aneurysm sac was closed over the graft with a  running 2-0 Vicryl suture.   The retroperitoneal tissue was then closed with  running 2-0 Vicryl.  The abdominal contents were returned to their normal  position after the small bowel was run.  The fascia was closed with two #1  PDS sutures.  The subcutaneous tissue was closed with 3-0 Vicryl and the  skin was closed with staples.  A sterile dressing was applied.  The patient  tolerated the procedure well and was transferred to the recovery room in  satisfactory condition.  All needle and sponge counts were correct.                                               Di Kindle. Edilia Bo, M.D.    CSD/MEDQ  D:  07/14/2003  T:  07/14/2003  Job:  [id number]   cc:   Titus Dubin. Alwyn Ren, M.D. [facility]   Vesta Mixer, M.D.  [address]  [place]  [place] [address]  Fax: [phone]

## 2010-10-26 NOTE — Op Note (Signed)
NAME:  Roger Stewart, Roger Stewart                          ACCOUNT NO.:  [id number]   MEDICAL RECORD NO.:  [id number]                   PATIENT TYPE:  OIB   LOCATION:  NA                                   FACILITY:  [facility]   PHYSICIAN:  Di Kindle. Edilia Bo, M.D.        DATE OF BIRTH:  17-Apr-1946   DATE OF PROCEDURE:  07/04/2003  DATE OF DISCHARGE:                                 OPERATIVE REPORT   PREOPERATIVE DIAGNOSIS:  Abdominal aortic aneurysm.   POSTOPERATIVE DIAGNOSIS:  Abdominal aortic aneurysm.   PROCEDURE:  1. Aortogram.  2. Bilateral iliac arteriogram.  3. Bilateral lower extremity runoff.   SURGEON:  Di Kindle. Edilia Bo, M.D.   ANESTHESIA:  Local with sedation.   INDICATIONS:  This is a 65 year old gentleman who was found on physical  examination by Dr. Titus Dubin. Hopper to have an abdominal aortic aneurysm.  CAT scan confirmed a 5.1-cm infrarenal abdominal aortic aneurysm.  In order  to plan elective repair, arteriography was recommended.  The procedure and  potential complications including but not limited to bleeding, arterial  injury, dye reaction, and kidney failure were discussed with the patient,  all his questions were answered and he was agreeable to proceed.   TECHNIQUE:  The patient was taken to the PV lab at [facility] and sedated with a  milligram of Versed and 50 mcg of fentanyl.  Both groins were prepped and  draped in the usual sterile fashion.  After the skin was infiltrated with 1%  lidocaine, the right common femoral artery was cannulated and a guidewire  introduced into the infrarenal aorta under fluoroscopic control.  A 5-French  sheath was passed over the wire and then the dilator was removed.  The  pigtail catheter positioned at the L1 vertebral body and flush aortogram  obtained.  A lateral aortic projection was then obtained.  The catheter was  then repositioned above the aortic bifurcation and oblique iliac projections  were obtained.  Bilateral lower  extremity runoff films were then obtained.   FINDINGS:  There are 2 renal arteries on the right and there is 1 renal  artery on the left.  No renal artery stenosis is identified.  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  Therefore, the size of the aneurysm cannot  be determined by this study.  Of note, the infrarenal neck measures  approximately 30 mm in diameter.  There is no significant occlusive disease  at the aortic bifurcation or either common external iliac arteries.  The  hypogastric arteries on both sides are patent.  The common iliac arteries  are slightly ectatic but not aneurysmal on this study.  The common femoral,  superficial femoral and deep femoral and popliteal arteries are patent  bilaterally.  The proximal tibial vessels are patent bilaterally.  There is  poor visualization distally but the posterior tibial  arteries are clearly  patent bilaterally to the foot.   CONCLUSION:  1. Infrarenal abdominal aortic aneurysm, based on CAT scan, although the     aneurysm cannot be seen on the     aortogram because of laminated thrombus.  2. Common iliac artery ectasia bilaterally.  3. No significant infrainguinal arterial occlusive disease is seen on this     study.                                               Di Kindle. Edilia Bo, M.D.    CSD/MEDQ  D:  07/04/2003  T:  07/04/2003  Job:  [id number]   cc:   Titus Dubin. Alwyn Ren, M.D. [facility]   [place] [facility]

## 2011-04-26 ENCOUNTER — Telehealth: Payer: Self-pay | Admitting: Internal Medicine

## 2011-04-26 MED ORDER — LISINOPRIL 20 MG PO TABS: 20.0000 mg | ORAL_TABLET | Freq: Every day | ORAL | Status: DC

## 2011-04-26 NOTE — Telephone Encounter (Signed)
Roger Stewart from [facility] in [place] - Requested refill lisinipril 20mg  once a day - [place] - fax [phone]

## 2011-04-26 NOTE — Telephone Encounter (Signed)
RX faxed

## 2011-05-01 ENCOUNTER — Other Ambulatory Visit: Payer: Self-pay | Admitting: Internal Medicine

## 2011-05-21 ENCOUNTER — Encounter: Payer: Self-pay | Admitting: Internal Medicine

## 2011-05-22 ENCOUNTER — Encounter: Payer: Self-pay | Admitting: Internal Medicine

## 2011-05-22 ENCOUNTER — Ambulatory Visit (INDEPENDENT_AMBULATORY_CARE_PROVIDER_SITE_OTHER): Payer: Medicare Other | Admitting: Internal Medicine

## 2011-05-22 ENCOUNTER — Ambulatory Visit (INDEPENDENT_AMBULATORY_CARE_PROVIDER_SITE_OTHER)
Admission: RE | Admit: 2011-05-22 | Discharge: 2011-05-22 | Disposition: A | Discharge status: Home / Self Care | Payer: Medicare Other | Source: Ambulatory Visit | Attending: Internal Medicine | Admitting: Internal Medicine

## 2011-05-22 VITALS — BP 124/88 | HR 71 | Temp 98.2°F | Resp 12 | Ht 72.0 in | Wt 220.2 lb

## 2011-05-22 DIAGNOSIS — F17200 Nicotine dependence, unspecified, uncomplicated: Secondary | ICD-10-CM

## 2011-05-22 DIAGNOSIS — Z860100 Personal history of colon polyps, unspecified: Secondary | ICD-10-CM

## 2011-05-22 DIAGNOSIS — R7309 Other abnormal glucose: Secondary | ICD-10-CM

## 2011-05-22 DIAGNOSIS — R059 Cough, unspecified: Secondary | ICD-10-CM

## 2011-05-22 DIAGNOSIS — R05 Cough: Secondary | ICD-10-CM

## 2011-05-22 DIAGNOSIS — E785 Hyperlipidemia, unspecified: Secondary | ICD-10-CM

## 2011-05-22 DIAGNOSIS — Z0000 Encounter for general adult medical examination without abnormal findings: Secondary | ICD-10-CM

## 2011-05-22 DIAGNOSIS — Z136 Encounter for screening for cardiovascular disorders: Secondary | ICD-10-CM

## 2011-05-22 DIAGNOSIS — I10 Essential (primary) hypertension: Secondary | ICD-10-CM

## 2011-05-22 LAB — HEPATIC FUNCTION PANEL
ALT: 18 U/L (ref 0–53)
AST: 19 U/L (ref 0–37)
Albumin: 3.8 g/dL (ref 3.5–5.2)
Alkaline Phosphatase: 71 U/L (ref 39–117)
Bilirubin, Direct: 0.1 mg/dL (ref 0.0–0.3)
Total Bilirubin: 0.7 mg/dL (ref 0.3–1.2)
Total Protein: 7.2 g/dL (ref 6.0–8.3)

## 2011-05-22 LAB — BASIC METABOLIC PANEL
BUN: 20 mg/dL (ref 6–23)
CO2: 28 mEq/L (ref 19–32)
Calcium: 8.9 mg/dL (ref 8.4–10.5)
Chloride: 101 mEq/L (ref 96–112)
Creatinine, Ser: 1 mg/dL (ref 0.4–1.5)
GFR: 78.5 mL/min (ref >60.00)
Glucose, Bld: 111 mg/dL — ABNORMAL HIGH (ref 70–99)
Potassium: 4.5 mEq/L (ref 3.5–5.1)
Sodium: 137 mEq/L (ref 135–145)

## 2011-05-22 LAB — CBC WITH DIFFERENTIAL/PLATELET
Basophils Absolute: 0 10*3/uL (ref 0.0–0.1)
Basophils Relative: 0.3 % (ref 0.0–3.0)
Eosinophils Absolute: 0.3 10*3/uL (ref 0.0–0.7)
Eosinophils Relative: 3.3 % (ref 0.0–5.0)
HCT: 43.8 % (ref 39.0–52.0)
Hemoglobin: 15.2 g/dL (ref 13.0–17.0)
Lymphocytes Relative: 41.9 % (ref 12.0–46.0)
Lymphs Abs: 3.4 10*3/uL (ref 0.7–4.0)
MCHC: 34.6 g/dL (ref 30.0–36.0)
MCV: 98 fl (ref 78.0–100.0)
Monocytes Absolute: 0.8 10*3/uL (ref 0.1–1.0)
Monocytes Relative: 9.5 % (ref 3.0–12.0)
Neutro Abs: 3.6 10*3/uL (ref 1.4–7.7)
Neutrophils Relative %: 45 % (ref 43.0–77.0)
Platelets: 226 10*3/uL (ref 150.0–400.0)
RBC: 4.47 Mil/uL (ref 4.22–5.81)
RDW: 13.7 % (ref 11.5–14.6)
WBC: 8 10*3/uL (ref 4.5–10.5)

## 2011-05-22 LAB — LIPID PANEL
Cholesterol: 224 mg/dL — ABNORMAL HIGH (ref 0–200)
HDL: 50.3 mg/dL (ref >39.00)
Total CHOL/HDL Ratio: 4
Triglycerides: 158 mg/dL — ABNORMAL HIGH (ref 0.0–149.0)
VLDL: 31.6 mg/dL (ref 0.0–40.0)

## 2011-05-22 LAB — LDL CHOLESTEROL, DIRECT: Direct LDL: 154.2 mg/dL

## 2011-05-22 LAB — TSH: TSH: 1.72 u[IU]/mL (ref 0.35–5.50)

## 2011-05-22 NOTE — Progress Notes (Signed)
Subjective:    Patient ID: Roger Stewart, male    DOB: [birth year], 65 y.o.   MRN: [id number]  HPI Medicare Wellness Visit:  The following psychosocial & medical history were reviewed as required by Medicare.   Social history: caffeine: only green tea , alcohol:  2 mixed drinks/ day  ,  tobacco use : cigars 3-4 X/ week; quit cig 2002  & exercise : 3X/ week as aerobics & weights.   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  Power of Attorney/Living Will status : in place  Vision ( as recorded per Nurse) & Hearing  evaluation :  Ophth exam due. Wall chart read @ 6 ft. Decreased acuity R ear to whisper @ 6 ft. Orientation :oriented X 3 , memory & recall :good,  math testing: good,and mood & affect : normal . Depression / anxiety: denied Travel history : 54 [place] , immunization status :Shingles needed (Note: he does not take Flu shot) , transfusion history:  no, and preventive health surveillance ( colonoscopies, BMD , etc as per protocol/ [facility]): colonoscopy 2007 negative, Dental care:  Seen 6 mos . Chart reviewed &  Updated. Active issues reviewed & addressed.       Review of Systems HYPERTENSION; Disease Monitoring: Blood pressure range-125-130/85-88  Chest pain, palpitations- no       Dyspnea- no Medications: Compliance- yes  Lightheadedness,Syncope- no    Edema- no  FASTING HYPERGLYCEMIA: Disease Monitoring: Blood Sugar ranges-FBS < 125  Polyuria/phagia/dipsia- no       Visual problems- no Medications: Compliance- none ; on low carb / calorie diet   HYPERLIPIDEMIA:mainly elevated TG Disease Monitoring: See symptoms for Hypertension Medications: Compliance- no meds   ROS:Abd pain, bowel changes- no   Muscle aches- no     Objective:   Physical Exam Gen.: Healthy and well-nourished in appearance. Alert,  appropriate and cooperative throughout exam. Head: Normocephalic without obvious abnormalities;  no alopecia  Eyes: No corneal or conjunctival inflammation noted.  Extraocular motion intact.  Ears: External  ear exam reveals no significant lesions or deformities. Canals: wax  Bilaterally , R > L clear . Nose: External nasal exam reveals no deformity or inflammation. Nasal mucosa are pink and moist. No lesions or exudates noted. Septum  dislocated  Mouth: Oral mucosa and oropharynx reveal no lesions or exudates. Teeth in good repair. Neck: No deformities, masses, or tenderness noted. Range of motion & . Thyroid normal. Lungs: Normal respiratory effort; chest expands symmetrically. Lungs are clear to auscultation without rales, wheezes, or increased work of breathing. Breath sounds are slightly decreased. He has an intermittent nonproductive cough. Heart: Normal rate and rhythm. Normal S1 and S2. No gallop, click, or rub. S 4 w/o murmur. Abdomen: Bowel sounds normal; abdomen soft and nontender. No masses, organomegaly or hernias noted. No AAA; op scar well healed Genitalia/DRE: slight testicular atrophy. Prostate is normal without enlargement, nodularity, asymmetry or induration.  Marland Kitchen  Musculoskeletal/extremities: No deformity or scoliosis noted of  the thoracic or lumbar spine. No clubbing, cyanosis, edema, or deformity noted. Range of motion  normal .Tone & strength  normal.Joints normal. Nail health  good. Vascular: Carotid, radial artery, dorsalis pedis and  posterior tibial pulses are full and equal. No bruits present. Neurologic: Alert and oriented x3. Deep tendon reflexes symmetrical and normal.          Skin: Irregular erythematous rash l peri nasal area (S/P FU application) Lymph: No cervical, axillary, or inguinal lymphadenopathy present. Psych: Mood and affect are normal. Normally interactive                                                                                          Assessment & Plan:  #1 Medicare Wellness Exam; criteria met ; data entered #2 Problem List reviewed ; Assessment/ Recommendations made.  #3 he does have a family history of prostate cancer in  his father. Prostate exam is normal. Medicare will not cover the PSA; he elected to defer PSA until  next year. His PSA was 0.45 in 2007 which would be associated with low risk long-term. #4 dry cough in the context of cigar smoking. Chest x-ray appropriate.  #5 history of cardiac rhythm disturbance preoperatively 3/12. Negative stress test. EKG at this time reveals first-degree A-V block without ischemic changes. Plan: see Orders

## 2011-05-22 NOTE — Patient Instructions (Addendum)
Preventive Health Care: Exercise at least 30-45 minutes a day,  3-4 days a week.  Eat a low-fat diet with lots of fruits and vegetables, up to 7-9 servings per day. Consume less than 40 grams of sugar per day from foods & drinks with High Fructose Corn Sugar as # 1,2,3 or # 4 on label. Eye Doctor - have an eye exam @ least annually.                                                         Alcohol If you drink, do it moderately,less than 9 drinks per week, preferably less than 6 @ most. Consider  [facility]'s smoking cessation program @ www.[employer].com or (216) 160-4682.   Order for x-rays entered into  the computer; these will be performed at [address] [facility]. across from [facility]. No appointment is necessary.

## 2011-05-23 LAB — HEMOGLOBIN A1C: Hgb A1c MFr Bld: 5.7 % (ref 4.6–6.5)

## 2011-06-18 DIAGNOSIS — M542 Cervicalgia: Secondary | ICD-10-CM | POA: Diagnosis not present

## 2011-06-18 DIAGNOSIS — M546 Pain in thoracic spine: Secondary | ICD-10-CM | POA: Diagnosis not present

## 2011-06-18 DIAGNOSIS — M62838 Other muscle spasm: Secondary | ICD-10-CM | POA: Diagnosis not present

## 2011-06-18 DIAGNOSIS — M545 Low back pain, unspecified: Secondary | ICD-10-CM | POA: Diagnosis not present

## 2011-06-20 DIAGNOSIS — M546 Pain in thoracic spine: Secondary | ICD-10-CM | POA: Diagnosis not present

## 2011-06-20 DIAGNOSIS — M545 Low back pain, unspecified: Secondary | ICD-10-CM | POA: Diagnosis not present

## 2011-06-20 DIAGNOSIS — M542 Cervicalgia: Secondary | ICD-10-CM | POA: Diagnosis not present

## 2011-06-20 DIAGNOSIS — M62838 Other muscle spasm: Secondary | ICD-10-CM | POA: Diagnosis not present

## 2011-07-02 DIAGNOSIS — M545 Low back pain, unspecified: Secondary | ICD-10-CM | POA: Diagnosis not present

## 2011-07-02 DIAGNOSIS — M546 Pain in thoracic spine: Secondary | ICD-10-CM | POA: Diagnosis not present

## 2011-07-02 DIAGNOSIS — M62838 Other muscle spasm: Secondary | ICD-10-CM | POA: Diagnosis not present

## 2011-07-02 DIAGNOSIS — M542 Cervicalgia: Secondary | ICD-10-CM | POA: Diagnosis not present

## 2011-07-04 DIAGNOSIS — M545 Low back pain, unspecified: Secondary | ICD-10-CM | POA: Diagnosis not present

## 2011-07-04 DIAGNOSIS — M546 Pain in thoracic spine: Secondary | ICD-10-CM | POA: Diagnosis not present

## 2011-07-04 DIAGNOSIS — M62838 Other muscle spasm: Secondary | ICD-10-CM | POA: Diagnosis not present

## 2011-07-04 DIAGNOSIS — M542 Cervicalgia: Secondary | ICD-10-CM | POA: Diagnosis not present

## 2011-07-12 ENCOUNTER — Other Ambulatory Visit: Payer: Self-pay | Admitting: Internal Medicine

## 2011-07-13 DIAGNOSIS — M542 Cervicalgia: Secondary | ICD-10-CM | POA: Diagnosis not present

## 2011-07-13 DIAGNOSIS — M531 Cervicobrachial syndrome: Secondary | ICD-10-CM | POA: Diagnosis not present

## 2011-07-14 DIAGNOSIS — M542 Cervicalgia: Secondary | ICD-10-CM | POA: Diagnosis not present

## 2011-07-14 DIAGNOSIS — M546 Pain in thoracic spine: Secondary | ICD-10-CM | POA: Diagnosis not present

## 2011-07-14 DIAGNOSIS — M531 Cervicobrachial syndrome: Secondary | ICD-10-CM | POA: Diagnosis not present

## 2011-07-15 DIAGNOSIS — M542 Cervicalgia: Secondary | ICD-10-CM | POA: Diagnosis not present

## 2011-07-15 DIAGNOSIS — M62838 Other muscle spasm: Secondary | ICD-10-CM | POA: Diagnosis not present

## 2011-07-16 DIAGNOSIS — M542 Cervicalgia: Secondary | ICD-10-CM | POA: Diagnosis not present

## 2011-07-16 DIAGNOSIS — M62838 Other muscle spasm: Secondary | ICD-10-CM | POA: Diagnosis not present

## 2011-07-17 DIAGNOSIS — M542 Cervicalgia: Secondary | ICD-10-CM | POA: Diagnosis not present

## 2011-07-17 DIAGNOSIS — M62838 Other muscle spasm: Secondary | ICD-10-CM | POA: Diagnosis not present

## 2011-07-18 DIAGNOSIS — M62838 Other muscle spasm: Secondary | ICD-10-CM | POA: Diagnosis not present

## 2011-07-18 DIAGNOSIS — M542 Cervicalgia: Secondary | ICD-10-CM | POA: Diagnosis not present

## 2011-07-25 DIAGNOSIS — M159 Polyosteoarthritis, unspecified: Secondary | ICD-10-CM | POA: Diagnosis not present

## 2011-07-25 DIAGNOSIS — M545 Low back pain, unspecified: Secondary | ICD-10-CM | POA: Diagnosis not present

## 2011-07-25 DIAGNOSIS — M5430 Sciatica, unspecified side: Secondary | ICD-10-CM | POA: Diagnosis not present

## 2011-08-06 DIAGNOSIS — M62838 Other muscle spasm: Secondary | ICD-10-CM | POA: Diagnosis not present

## 2011-08-06 DIAGNOSIS — M542 Cervicalgia: Secondary | ICD-10-CM | POA: Diagnosis not present

## 2011-09-03 DIAGNOSIS — M545 Low back pain, unspecified: Secondary | ICD-10-CM | POA: Diagnosis not present

## 2011-09-03 DIAGNOSIS — M546 Pain in thoracic spine: Secondary | ICD-10-CM | POA: Diagnosis not present

## 2011-09-03 DIAGNOSIS — M62838 Other muscle spasm: Secondary | ICD-10-CM | POA: Diagnosis not present

## 2011-09-03 DIAGNOSIS — M542 Cervicalgia: Secondary | ICD-10-CM | POA: Diagnosis not present

## 2011-09-05 DIAGNOSIS — E559 Vitamin D deficiency, unspecified: Secondary | ICD-10-CM | POA: Diagnosis not present

## 2011-09-05 DIAGNOSIS — E291 Testicular hypofunction: Secondary | ICD-10-CM | POA: Diagnosis not present

## 2011-09-05 DIAGNOSIS — N400 Enlarged prostate without lower urinary tract symptoms: Secondary | ICD-10-CM | POA: Diagnosis not present

## 2011-09-05 DIAGNOSIS — E782 Mixed hyperlipidemia: Secondary | ICD-10-CM | POA: Diagnosis not present

## 2011-09-05 DIAGNOSIS — N971 Female infertility of tubal origin: Secondary | ICD-10-CM | POA: Diagnosis not present

## 2011-09-05 DIAGNOSIS — R6889 Other general symptoms and signs: Secondary | ICD-10-CM | POA: Diagnosis not present

## 2011-09-17 DIAGNOSIS — M5030 Other cervical disc degeneration, unspecified cervical region: Secondary | ICD-10-CM | POA: Diagnosis not present

## 2011-09-17 DIAGNOSIS — M542 Cervicalgia: Secondary | ICD-10-CM | POA: Diagnosis not present

## 2011-10-01 DIAGNOSIS — D1801 Hemangioma of skin and subcutaneous tissue: Secondary | ICD-10-CM | POA: Diagnosis not present

## 2011-10-01 DIAGNOSIS — Z85828 Personal history of other malignant neoplasm of skin: Secondary | ICD-10-CM | POA: Diagnosis not present

## 2011-10-01 DIAGNOSIS — L821 Other seborrheic keratosis: Secondary | ICD-10-CM | POA: Diagnosis not present

## 2011-10-01 DIAGNOSIS — L578 Other skin changes due to chronic exposure to nonionizing radiation: Secondary | ICD-10-CM | POA: Diagnosis not present

## 2011-12-06 ENCOUNTER — Other Ambulatory Visit: Payer: Self-pay | Admitting: Internal Medicine

## 2012-01-20 DIAGNOSIS — M9981 Other biomechanical lesions of cervical region: Secondary | ICD-10-CM | POA: Diagnosis not present

## 2012-01-20 DIAGNOSIS — M999 Biomechanical lesion, unspecified: Secondary | ICD-10-CM | POA: Diagnosis not present

## 2012-01-20 DIAGNOSIS — M5030 Other cervical disc degeneration, unspecified cervical region: Secondary | ICD-10-CM | POA: Diagnosis not present

## 2012-01-20 DIAGNOSIS — M5137 Other intervertebral disc degeneration, lumbosacral region: Secondary | ICD-10-CM | POA: Diagnosis not present

## 2012-03-17 DIAGNOSIS — M542 Cervicalgia: Secondary | ICD-10-CM | POA: Diagnosis not present

## 2012-03-17 DIAGNOSIS — M5030 Other cervical disc degeneration, unspecified cervical region: Secondary | ICD-10-CM | POA: Diagnosis not present

## 2012-03-19 DIAGNOSIS — M5030 Other cervical disc degeneration, unspecified cervical region: Secondary | ICD-10-CM | POA: Diagnosis not present

## 2012-03-19 DIAGNOSIS — M542 Cervicalgia: Secondary | ICD-10-CM | POA: Diagnosis not present

## 2012-03-24 DIAGNOSIS — M545 Low back pain, unspecified: Secondary | ICD-10-CM | POA: Diagnosis not present

## 2012-03-24 DIAGNOSIS — M159 Polyosteoarthritis, unspecified: Secondary | ICD-10-CM | POA: Diagnosis not present

## 2012-03-24 DIAGNOSIS — M5430 Sciatica, unspecified side: Secondary | ICD-10-CM | POA: Diagnosis not present

## 2012-04-03 DIAGNOSIS — M159 Polyosteoarthritis, unspecified: Secondary | ICD-10-CM | POA: Diagnosis not present

## 2012-04-03 DIAGNOSIS — M5430 Sciatica, unspecified side: Secondary | ICD-10-CM | POA: Diagnosis not present

## 2012-04-03 DIAGNOSIS — M25559 Pain in unspecified hip: Secondary | ICD-10-CM | POA: Diagnosis not present

## 2012-05-15 DIAGNOSIS — E782 Mixed hyperlipidemia: Secondary | ICD-10-CM | POA: Diagnosis not present

## 2012-05-15 DIAGNOSIS — E291 Testicular hypofunction: Secondary | ICD-10-CM | POA: Diagnosis not present

## 2012-05-15 DIAGNOSIS — N400 Enlarged prostate without lower urinary tract symptoms: Secondary | ICD-10-CM | POA: Diagnosis not present

## 2012-05-15 DIAGNOSIS — R6889 Other general symptoms and signs: Secondary | ICD-10-CM | POA: Diagnosis not present

## 2012-05-22 ENCOUNTER — Ambulatory Visit (INDEPENDENT_AMBULATORY_CARE_PROVIDER_SITE_OTHER): Payer: Medicare Other | Admitting: Internal Medicine

## 2012-05-22 ENCOUNTER — Encounter: Payer: Self-pay | Admitting: Internal Medicine

## 2012-05-22 VITALS — BP 122/84 | HR 67 | Temp 97.6°F | Resp 12 | Ht 72.5 in | Wt 214.8 lb

## 2012-05-22 DIAGNOSIS — R9431 Abnormal electrocardiogram [ECG] [EKG]: Secondary | ICD-10-CM | POA: Insufficient documentation

## 2012-05-22 DIAGNOSIS — Z1331 Encounter for screening for depression: Secondary | ICD-10-CM | POA: Diagnosis not present

## 2012-05-22 DIAGNOSIS — I10 Essential (primary) hypertension: Secondary | ICD-10-CM | POA: Diagnosis not present

## 2012-05-22 DIAGNOSIS — R7309 Other abnormal glucose: Secondary | ICD-10-CM | POA: Diagnosis not present

## 2012-05-22 DIAGNOSIS — Z8042 Family history of malignant neoplasm of prostate: Secondary | ICD-10-CM | POA: Insufficient documentation

## 2012-05-22 DIAGNOSIS — Z0000 Encounter for general adult medical examination without abnormal findings: Secondary | ICD-10-CM

## 2012-05-22 DIAGNOSIS — Z86010 Personal history of colonic polyps: Secondary | ICD-10-CM

## 2012-05-22 DIAGNOSIS — E785 Hyperlipidemia, unspecified: Secondary | ICD-10-CM | POA: Diagnosis not present

## 2012-05-22 LAB — CBC WITH DIFFERENTIAL/PLATELET
Basophils Absolute: 0 10*3/uL (ref 0.0–0.1)
Basophils Relative: 0.4 % (ref 0.0–3.0)
Eosinophils Absolute: 0.1 10*3/uL (ref 0.0–0.7)
Eosinophils Relative: 1.8 % (ref 0.0–5.0)
HCT: 47.6 % (ref 39.0–52.0)
Hemoglobin: 16.1 g/dL (ref 13.0–17.0)
Lymphocytes Relative: 41.7 % (ref 12.0–46.0)
Lymphs Abs: 3.3 10*3/uL (ref 0.7–4.0)
MCHC: 33.8 g/dL (ref 30.0–36.0)
MCV: 99.7 fl (ref 78.0–100.0)
Monocytes Absolute: 0.9 10*3/uL (ref 0.1–1.0)
Monocytes Relative: 11.4 % (ref 3.0–12.0)
Neutro Abs: 3.6 10*3/uL (ref 1.4–7.7)
Neutrophils Relative %: 44.7 % (ref 43.0–77.0)
Platelets: 209 10*3/uL (ref 150.0–400.0)
RBC: 4.78 Mil/uL (ref 4.22–5.81)
RDW: 14.7 % — ABNORMAL HIGH (ref 11.5–14.6)
WBC: 8 10*3/uL (ref 4.5–10.5)

## 2012-05-22 LAB — BASIC METABOLIC PANEL
BUN: 19 mg/dL (ref 6–23)
CO2: 28 mEq/L (ref 19–32)
Calcium: 9.2 mg/dL (ref 8.4–10.5)
Chloride: 97 mEq/L (ref 96–112)
Creatinine, Ser: 0.9 mg/dL (ref 0.4–1.5)
GFR: 88.26 mL/min (ref >60.00)
Glucose, Bld: 125 mg/dL — ABNORMAL HIGH (ref 70–99)
Potassium: 4.7 mEq/L (ref 3.5–5.1)
Sodium: 134 mEq/L — ABNORMAL LOW (ref 135–145)

## 2012-05-22 LAB — HEPATIC FUNCTION PANEL
ALT: 19 U/L (ref 0–53)
AST: 18 U/L (ref 0–37)
Albumin: 3.9 g/dL (ref 3.5–5.2)
Alkaline Phosphatase: 64 U/L (ref 39–117)
Bilirubin, Direct: 0.2 mg/dL (ref 0.0–0.3)
Total Bilirubin: 1 mg/dL (ref 0.3–1.2)
Total Protein: 7 g/dL (ref 6.0–8.3)

## 2012-05-22 LAB — LDL CHOLESTEROL, DIRECT: Direct LDL: 127.3 mg/dL

## 2012-05-22 LAB — HEMOGLOBIN A1C: Hgb A1c MFr Bld: 5.6 % (ref 4.6–6.5)

## 2012-05-22 LAB — LIPID PANEL
Cholesterol: 206 mg/dL — ABNORMAL HIGH (ref 0–200)
HDL: 63.4 mg/dL (ref >39.00)
Total CHOL/HDL Ratio: 3
Triglycerides: 126 mg/dL (ref 0.0–149.0)
VLDL: 25.2 mg/dL (ref 0.0–40.0)

## 2012-05-22 LAB — TSH: TSH: 2.08 u[IU]/mL (ref 0.35–5.50)

## 2012-05-22 LAB — PSA: PSA: 1.07 ng/mL (ref 0.10–4.00)

## 2012-05-22 NOTE — Progress Notes (Signed)
Subjective:    Patient ID: Roger Stewart, male    DOB: 08-07-1945, 66 y.o.   MRN: [id number]  HPI Medicare Wellness Visit:  The following psychosocial & medical history were reviewed as required by Medicare.   Social history: caffeine: none , alcohol:  14 drinks/ week ,  tobacco use : 1 cigar daily or every other day  & exercise : 4X/ week 45-60 min.   Home & personal  safety / fall risk: no issues, activities of daily living: no limitations , seatbelt use : yes , and smoke alarm employment : yes  Power of Attorney/Living Will status : in place  Vision ( as recorded per Nurse) & Hearing  evaluation :  Ophth exam 05/2011.No hearing exam Orientation :oriented X 3 , memory & recall : goods, spelling or math testing: good,and mood & affect : normal . Depression / anxiety:denied Travel history : [place] 2009 , immunization status : none taken , transfusion history:  no, and preventive health surveillance ( colonoscopies, BMD , etc as per protocol/ [facility]): colonoscopy due 2015, Dr Madilyn Fireman, Dental care:  Every 90 days . Chart reviewed &  Updated. Active issues reviewed & addressed.       Review of Systems HYPERTENSION: Disease Monitoring: Blood pressure range-95/65 in [place]; 135/85 back here  Chest pain, palpitations- no       Dyspnea- no Medications: Compliance- yes but Beta blocker only daily  Lightheadedness,Syncope- only with low BP    Edema- no  FASTING HYPERGLYCEMIA, PMH of:  Polyuria/phagia/dipsia- no      Visual problems- no  HYPERLIPIDEMIA: Disease Monitoring: See symptoms for Hypertension Medications: Compliance- not on statin; on heart healthy diet  Abd pain, bowel changes- no   Muscle aches- no                                                             Objective:   Physical Exam  Gen.:  well-nourished in appearance. Alert, appropriate and cooperative throughout exam. Head: Normocephalic without obvious abnormalities Eyes: No corneal or conjunctival inflammation  noted. Pupils equal round reactive to light and accommodation. Fundal exam is benign without hemorrhages, exudate, papilledema. Extraocular motion intact. Vision grossly normal. Ears: External  ear exam reveals no significant lesions or deformities. Wax bilaterally. Hearing is grossly normal bilaterally. Nose: External nasal exam reveals no deformity or inflammation. Nasal mucosa are pink and moist. No lesions or exudates noted.   Mouth: Oral mucosa and oropharynx reveal no lesions or exudates. Teeth in good repair. Neck: No deformities, masses, or tenderness noted. Range of motion & Thyroid normal. Lungs: Normal respiratory effort; chest expands symmetrically. Lungs are clear to auscultation without rales, wheezes, or increased work of breathing. Heart: Normal rate and rhythm. Normal S1 and S2. No gallop, click, or rub. S4 w/o murmur. Abdomen: Bowel sounds normal; abdomen soft and nontender. No masses, organomegaly or hernias noted. Genitalia: Genitalia normal except for left varices .Prostate is normal without enlargement, asymmetry, nodularity, or induration.  Musculoskeletal/extremities: No deformity or scoliosis noted of  the thoracic or lumbar spine. No clubbing, cyanosis, edema, or deformity noted. Range of motion  normal .Tone & strength  normal.Joints normal. Nail health  good. Vascular: Carotid, radial artery, dorsalis pedis and  posterior tibial pulses are full and equal. No bruits present. Neurologic: Alert and oriented x3. Deep tendon reflexes symmetrical and normal.          Skin: Intact without suspicious lesions or rashes. Lymph: No cervical, axillary, or inguinal lymphadenopathy present. Psych: Mood and affect are normal. Normally interactive                                    Assessment & Plan:  #1 Medicare Wellness Exam; criteria met ; data entered #2 Problem List reviewed ; Assessment/  Recommendations made Plan: see Orders

## 2012-05-22 NOTE — Patient Instructions (Addendum)
Preventive Health Care: Exercise at least 30-45 minutes a day,  3-4 days a week.  Eat a low-fat diet with lots of fruits and vegetables, up to 7-9 servings per day.  Consume less than 40 grams of sugar per day from foods & drinks with High Fructose Corn Sugar as #1,2,3 or # 4 on label. Alcohol If you drink, do it moderately,less than 9 drinks per week, preferably less than 6 @ most. Blood Pressure Goal  Ideally is an AVERAGE < 135/85. This AVERAGE should be calculated from @ least 5-7 BP readings taken @ different times of day on different days of week. You should not respond to isolated BP readings , but rather the AVERAGE for that week.  Please think about quitting smoking. Review the risks we discussed. Please call 1-800-QUIT-NOW (204-555-5092) for free smoking cessation counseling.  Review and correct the record as indicated. Please share record with all medical staff seen.   If you activate My Chart; the results can be released to you as soon as they populate from the lab. If you choose not to use this program; the labs have to be reviewed, copied & mailed  causing a delay in getting the results to you.

## 2012-06-11 DIAGNOSIS — M545 Low back pain, unspecified: Secondary | ICD-10-CM | POA: Diagnosis not present

## 2012-06-11 DIAGNOSIS — M25559 Pain in unspecified hip: Secondary | ICD-10-CM | POA: Diagnosis not present

## 2012-06-11 DIAGNOSIS — M5430 Sciatica, unspecified side: Secondary | ICD-10-CM | POA: Diagnosis not present

## 2012-06-11 DIAGNOSIS — M159 Polyosteoarthritis, unspecified: Secondary | ICD-10-CM | POA: Diagnosis not present

## 2012-07-08 DIAGNOSIS — M5430 Sciatica, unspecified side: Secondary | ICD-10-CM | POA: Diagnosis not present

## 2012-07-14 DIAGNOSIS — M5430 Sciatica, unspecified side: Secondary | ICD-10-CM | POA: Diagnosis not present

## 2012-07-16 ENCOUNTER — Other Ambulatory Visit: Payer: Self-pay | Admitting: Internal Medicine

## 2012-07-16 DIAGNOSIS — M5430 Sciatica, unspecified side: Secondary | ICD-10-CM | POA: Diagnosis not present

## 2012-07-21 DIAGNOSIS — M5430 Sciatica, unspecified side: Secondary | ICD-10-CM | POA: Diagnosis not present

## 2012-07-22 DIAGNOSIS — M25559 Pain in unspecified hip: Secondary | ICD-10-CM | POA: Diagnosis not present

## 2012-07-22 DIAGNOSIS — M545 Low back pain, unspecified: Secondary | ICD-10-CM | POA: Diagnosis not present

## 2012-07-22 DIAGNOSIS — M5430 Sciatica, unspecified side: Secondary | ICD-10-CM | POA: Diagnosis not present

## 2012-07-22 DIAGNOSIS — M159 Polyosteoarthritis, unspecified: Secondary | ICD-10-CM | POA: Diagnosis not present

## 2012-07-23 DIAGNOSIS — M5430 Sciatica, unspecified side: Secondary | ICD-10-CM | POA: Diagnosis not present

## 2012-07-24 DIAGNOSIS — M5126 Other intervertebral disc displacement, lumbar region: Secondary | ICD-10-CM | POA: Diagnosis not present

## 2012-07-24 DIAGNOSIS — M47817 Spondylosis without myelopathy or radiculopathy, lumbosacral region: Secondary | ICD-10-CM | POA: Diagnosis not present

## 2012-07-24 DIAGNOSIS — M48061 Spinal stenosis, lumbar region without neurogenic claudication: Secondary | ICD-10-CM | POA: Diagnosis not present

## 2012-07-25 ENCOUNTER — Other Ambulatory Visit: Payer: Self-pay

## 2012-08-06 DIAGNOSIS — M545 Low back pain, unspecified: Secondary | ICD-10-CM | POA: Diagnosis not present

## 2012-08-06 DIAGNOSIS — M159 Polyosteoarthritis, unspecified: Secondary | ICD-10-CM | POA: Diagnosis not present

## 2012-08-06 DIAGNOSIS — M25559 Pain in unspecified hip: Secondary | ICD-10-CM | POA: Diagnosis not present

## 2012-08-06 DIAGNOSIS — M5430 Sciatica, unspecified side: Secondary | ICD-10-CM | POA: Diagnosis not present

## 2012-08-18 DIAGNOSIS — M5137 Other intervertebral disc degeneration, lumbosacral region: Secondary | ICD-10-CM | POA: Diagnosis not present

## 2012-08-18 DIAGNOSIS — M5430 Sciatica, unspecified side: Secondary | ICD-10-CM | POA: Diagnosis not present

## 2012-08-18 DIAGNOSIS — M48061 Spinal stenosis, lumbar region without neurogenic claudication: Secondary | ICD-10-CM | POA: Diagnosis not present

## 2012-08-20 DIAGNOSIS — M5430 Sciatica, unspecified side: Secondary | ICD-10-CM | POA: Diagnosis not present

## 2012-08-20 DIAGNOSIS — M48061 Spinal stenosis, lumbar region without neurogenic claudication: Secondary | ICD-10-CM | POA: Diagnosis not present

## 2012-08-20 DIAGNOSIS — M5137 Other intervertebral disc degeneration, lumbosacral region: Secondary | ICD-10-CM | POA: Diagnosis not present

## 2012-08-24 DIAGNOSIS — M5430 Sciatica, unspecified side: Secondary | ICD-10-CM | POA: Diagnosis not present

## 2012-08-24 DIAGNOSIS — M5137 Other intervertebral disc degeneration, lumbosacral region: Secondary | ICD-10-CM | POA: Diagnosis not present

## 2012-08-24 DIAGNOSIS — M48061 Spinal stenosis, lumbar region without neurogenic claudication: Secondary | ICD-10-CM | POA: Diagnosis not present

## 2012-09-07 DIAGNOSIS — M5137 Other intervertebral disc degeneration, lumbosacral region: Secondary | ICD-10-CM | POA: Diagnosis not present

## 2012-09-07 DIAGNOSIS — M5430 Sciatica, unspecified side: Secondary | ICD-10-CM | POA: Diagnosis not present

## 2012-09-07 DIAGNOSIS — M48061 Spinal stenosis, lumbar region without neurogenic claudication: Secondary | ICD-10-CM | POA: Diagnosis not present

## 2012-09-10 DIAGNOSIS — M5430 Sciatica, unspecified side: Secondary | ICD-10-CM | POA: Diagnosis not present

## 2012-09-10 DIAGNOSIS — M5137 Other intervertebral disc degeneration, lumbosacral region: Secondary | ICD-10-CM | POA: Diagnosis not present

## 2012-09-10 DIAGNOSIS — M48061 Spinal stenosis, lumbar region without neurogenic claudication: Secondary | ICD-10-CM | POA: Diagnosis not present

## 2012-09-22 ENCOUNTER — Other Ambulatory Visit: Payer: Self-pay | Admitting: Internal Medicine

## 2012-09-28 DIAGNOSIS — M5137 Other intervertebral disc degeneration, lumbosacral region: Secondary | ICD-10-CM | POA: Diagnosis not present

## 2012-09-28 DIAGNOSIS — M5430 Sciatica, unspecified side: Secondary | ICD-10-CM | POA: Diagnosis not present

## 2012-09-28 DIAGNOSIS — M48061 Spinal stenosis, lumbar region without neurogenic claudication: Secondary | ICD-10-CM | POA: Diagnosis not present

## 2012-10-06 DIAGNOSIS — M542 Cervicalgia: Secondary | ICD-10-CM | POA: Diagnosis not present

## 2012-10-06 DIAGNOSIS — M2569 Stiffness of other specified joint, not elsewhere classified: Secondary | ICD-10-CM | POA: Diagnosis not present

## 2012-10-06 DIAGNOSIS — M5137 Other intervertebral disc degeneration, lumbosacral region: Secondary | ICD-10-CM | POA: Diagnosis not present

## 2012-10-06 DIAGNOSIS — M438X9 Other specified deforming dorsopathies, site unspecified: Secondary | ICD-10-CM | POA: Diagnosis not present

## 2012-10-08 DIAGNOSIS — M438X9 Other specified deforming dorsopathies, site unspecified: Secondary | ICD-10-CM | POA: Diagnosis not present

## 2012-10-08 DIAGNOSIS — M5137 Other intervertebral disc degeneration, lumbosacral region: Secondary | ICD-10-CM | POA: Diagnosis not present

## 2012-10-08 DIAGNOSIS — M2569 Stiffness of other specified joint, not elsewhere classified: Secondary | ICD-10-CM | POA: Diagnosis not present

## 2012-10-08 DIAGNOSIS — M542 Cervicalgia: Secondary | ICD-10-CM | POA: Diagnosis not present

## 2012-10-09 DIAGNOSIS — M5137 Other intervertebral disc degeneration, lumbosacral region: Secondary | ICD-10-CM | POA: Diagnosis not present

## 2012-10-09 DIAGNOSIS — M2569 Stiffness of other specified joint, not elsewhere classified: Secondary | ICD-10-CM | POA: Diagnosis not present

## 2012-10-09 DIAGNOSIS — M542 Cervicalgia: Secondary | ICD-10-CM | POA: Diagnosis not present

## 2012-10-09 DIAGNOSIS — M999 Biomechanical lesion, unspecified: Secondary | ICD-10-CM | POA: Diagnosis not present

## 2012-10-09 DIAGNOSIS — M76899 Other specified enthesopathies of unspecified lower limb, excluding foot: Secondary | ICD-10-CM | POA: Diagnosis not present

## 2012-10-09 DIAGNOSIS — M438X9 Other specified deforming dorsopathies, site unspecified: Secondary | ICD-10-CM | POA: Diagnosis not present

## 2012-10-09 DIAGNOSIS — IMO0002 Reserved for concepts with insufficient information to code with codable children: Secondary | ICD-10-CM | POA: Diagnosis not present

## 2012-10-09 DIAGNOSIS — IMO0001 Reserved for inherently not codable concepts without codable children: Secondary | ICD-10-CM | POA: Diagnosis not present

## 2012-10-09 DIAGNOSIS — M62838 Other muscle spasm: Secondary | ICD-10-CM | POA: Diagnosis not present

## 2012-10-12 DIAGNOSIS — IMO0002 Reserved for concepts with insufficient information to code with codable children: Secondary | ICD-10-CM | POA: Diagnosis not present

## 2012-10-12 DIAGNOSIS — M7150 Other bursitis, not elsewhere classified, unspecified site: Secondary | ICD-10-CM | POA: Diagnosis not present

## 2012-10-12 DIAGNOSIS — M999 Biomechanical lesion, unspecified: Secondary | ICD-10-CM | POA: Diagnosis not present

## 2012-10-12 DIAGNOSIS — M542 Cervicalgia: Secondary | ICD-10-CM | POA: Diagnosis not present

## 2012-10-12 DIAGNOSIS — M2569 Stiffness of other specified joint, not elsewhere classified: Secondary | ICD-10-CM | POA: Diagnosis not present

## 2012-10-12 DIAGNOSIS — M438X9 Other specified deforming dorsopathies, site unspecified: Secondary | ICD-10-CM | POA: Diagnosis not present

## 2012-10-12 DIAGNOSIS — M5137 Other intervertebral disc degeneration, lumbosacral region: Secondary | ICD-10-CM | POA: Diagnosis not present

## 2012-10-13 DIAGNOSIS — IMO0001 Reserved for inherently not codable concepts without codable children: Secondary | ICD-10-CM | POA: Diagnosis not present

## 2012-10-13 DIAGNOSIS — M5137 Other intervertebral disc degeneration, lumbosacral region: Secondary | ICD-10-CM | POA: Diagnosis not present

## 2012-10-13 DIAGNOSIS — IMO0002 Reserved for concepts with insufficient information to code with codable children: Secondary | ICD-10-CM | POA: Diagnosis not present

## 2012-10-13 DIAGNOSIS — M542 Cervicalgia: Secondary | ICD-10-CM | POA: Diagnosis not present

## 2012-10-13 DIAGNOSIS — M999 Biomechanical lesion, unspecified: Secondary | ICD-10-CM | POA: Diagnosis not present

## 2012-10-13 DIAGNOSIS — M2569 Stiffness of other specified joint, not elsewhere classified: Secondary | ICD-10-CM | POA: Diagnosis not present

## 2012-10-13 DIAGNOSIS — M62838 Other muscle spasm: Secondary | ICD-10-CM | POA: Diagnosis not present

## 2012-10-13 DIAGNOSIS — M438X9 Other specified deforming dorsopathies, site unspecified: Secondary | ICD-10-CM | POA: Diagnosis not present

## 2012-10-15 DIAGNOSIS — IMO0002 Reserved for concepts with insufficient information to code with codable children: Secondary | ICD-10-CM | POA: Diagnosis not present

## 2012-10-15 DIAGNOSIS — M542 Cervicalgia: Secondary | ICD-10-CM | POA: Diagnosis not present

## 2012-10-15 DIAGNOSIS — IMO0001 Reserved for inherently not codable concepts without codable children: Secondary | ICD-10-CM | POA: Diagnosis not present

## 2012-10-15 DIAGNOSIS — M999 Biomechanical lesion, unspecified: Secondary | ICD-10-CM | POA: Diagnosis not present

## 2012-10-15 DIAGNOSIS — M76899 Other specified enthesopathies of unspecified lower limb, excluding foot: Secondary | ICD-10-CM | POA: Diagnosis not present

## 2012-10-15 DIAGNOSIS — M62838 Other muscle spasm: Secondary | ICD-10-CM | POA: Diagnosis not present

## 2012-10-16 DIAGNOSIS — IMO0001 Reserved for inherently not codable concepts without codable children: Secondary | ICD-10-CM | POA: Diagnosis not present

## 2012-10-16 DIAGNOSIS — M542 Cervicalgia: Secondary | ICD-10-CM | POA: Diagnosis not present

## 2012-10-16 DIAGNOSIS — IMO0002 Reserved for concepts with insufficient information to code with codable children: Secondary | ICD-10-CM | POA: Diagnosis not present

## 2012-10-16 DIAGNOSIS — M76899 Other specified enthesopathies of unspecified lower limb, excluding foot: Secondary | ICD-10-CM | POA: Diagnosis not present

## 2012-10-16 DIAGNOSIS — M999 Biomechanical lesion, unspecified: Secondary | ICD-10-CM | POA: Diagnosis not present

## 2012-10-16 DIAGNOSIS — M62838 Other muscle spasm: Secondary | ICD-10-CM | POA: Diagnosis not present

## 2012-10-19 DIAGNOSIS — M999 Biomechanical lesion, unspecified: Secondary | ICD-10-CM | POA: Diagnosis not present

## 2012-10-19 DIAGNOSIS — IMO0001 Reserved for inherently not codable concepts without codable children: Secondary | ICD-10-CM | POA: Diagnosis not present

## 2012-10-19 DIAGNOSIS — IMO0002 Reserved for concepts with insufficient information to code with codable children: Secondary | ICD-10-CM | POA: Diagnosis not present

## 2012-10-19 DIAGNOSIS — M545 Low back pain, unspecified: Secondary | ICD-10-CM | POA: Diagnosis not present

## 2012-10-19 DIAGNOSIS — M62838 Other muscle spasm: Secondary | ICD-10-CM | POA: Diagnosis not present

## 2012-10-19 DIAGNOSIS — M7150 Other bursitis, not elsewhere classified, unspecified site: Secondary | ICD-10-CM | POA: Diagnosis not present

## 2012-10-19 DIAGNOSIS — M542 Cervicalgia: Secondary | ICD-10-CM | POA: Diagnosis not present

## 2012-10-20 DIAGNOSIS — M999 Biomechanical lesion, unspecified: Secondary | ICD-10-CM | POA: Diagnosis not present

## 2012-10-20 DIAGNOSIS — IMO0001 Reserved for inherently not codable concepts without codable children: Secondary | ICD-10-CM | POA: Diagnosis not present

## 2012-10-20 DIAGNOSIS — M62838 Other muscle spasm: Secondary | ICD-10-CM | POA: Diagnosis not present

## 2012-10-20 DIAGNOSIS — IMO0002 Reserved for concepts with insufficient information to code with codable children: Secondary | ICD-10-CM | POA: Diagnosis not present

## 2012-10-20 DIAGNOSIS — M545 Low back pain, unspecified: Secondary | ICD-10-CM | POA: Diagnosis not present

## 2012-10-20 DIAGNOSIS — M542 Cervicalgia: Secondary | ICD-10-CM | POA: Diagnosis not present

## 2012-10-20 DIAGNOSIS — M7150 Other bursitis, not elsewhere classified, unspecified site: Secondary | ICD-10-CM | POA: Diagnosis not present

## 2012-10-22 DIAGNOSIS — IMO0002 Reserved for concepts with insufficient information to code with codable children: Secondary | ICD-10-CM | POA: Diagnosis not present

## 2012-10-22 DIAGNOSIS — M542 Cervicalgia: Secondary | ICD-10-CM | POA: Diagnosis not present

## 2012-10-22 DIAGNOSIS — M999 Biomechanical lesion, unspecified: Secondary | ICD-10-CM | POA: Diagnosis not present

## 2012-10-23 DIAGNOSIS — IMO0002 Reserved for concepts with insufficient information to code with codable children: Secondary | ICD-10-CM | POA: Diagnosis not present

## 2012-10-23 DIAGNOSIS — M542 Cervicalgia: Secondary | ICD-10-CM | POA: Diagnosis not present

## 2012-10-23 DIAGNOSIS — M438X9 Other specified deforming dorsopathies, site unspecified: Secondary | ICD-10-CM | POA: Diagnosis not present

## 2012-10-23 DIAGNOSIS — M2569 Stiffness of other specified joint, not elsewhere classified: Secondary | ICD-10-CM | POA: Diagnosis not present

## 2012-10-23 DIAGNOSIS — M5137 Other intervertebral disc degeneration, lumbosacral region: Secondary | ICD-10-CM | POA: Diagnosis not present

## 2012-10-23 DIAGNOSIS — M62838 Other muscle spasm: Secondary | ICD-10-CM | POA: Diagnosis not present

## 2012-10-23 DIAGNOSIS — M999 Biomechanical lesion, unspecified: Secondary | ICD-10-CM | POA: Diagnosis not present

## 2012-10-23 DIAGNOSIS — M76899 Other specified enthesopathies of unspecified lower limb, excluding foot: Secondary | ICD-10-CM | POA: Diagnosis not present

## 2012-10-23 DIAGNOSIS — IMO0001 Reserved for inherently not codable concepts without codable children: Secondary | ICD-10-CM | POA: Diagnosis not present

## 2012-10-26 DIAGNOSIS — M542 Cervicalgia: Secondary | ICD-10-CM | POA: Diagnosis not present

## 2012-10-26 DIAGNOSIS — M2569 Stiffness of other specified joint, not elsewhere classified: Secondary | ICD-10-CM | POA: Diagnosis not present

## 2012-10-26 DIAGNOSIS — M5137 Other intervertebral disc degeneration, lumbosacral region: Secondary | ICD-10-CM | POA: Diagnosis not present

## 2012-10-26 DIAGNOSIS — IMO0002 Reserved for concepts with insufficient information to code with codable children: Secondary | ICD-10-CM | POA: Diagnosis not present

## 2012-10-26 DIAGNOSIS — M999 Biomechanical lesion, unspecified: Secondary | ICD-10-CM | POA: Diagnosis not present

## 2012-10-26 DIAGNOSIS — M7150 Other bursitis, not elsewhere classified, unspecified site: Secondary | ICD-10-CM | POA: Diagnosis not present

## 2012-10-26 DIAGNOSIS — M438X9 Other specified deforming dorsopathies, site unspecified: Secondary | ICD-10-CM | POA: Diagnosis not present

## 2012-10-27 DIAGNOSIS — M2569 Stiffness of other specified joint, not elsewhere classified: Secondary | ICD-10-CM | POA: Diagnosis not present

## 2012-10-27 DIAGNOSIS — M542 Cervicalgia: Secondary | ICD-10-CM | POA: Diagnosis not present

## 2012-10-27 DIAGNOSIS — IMO0002 Reserved for concepts with insufficient information to code with codable children: Secondary | ICD-10-CM | POA: Diagnosis not present

## 2012-10-27 DIAGNOSIS — M438X9 Other specified deforming dorsopathies, site unspecified: Secondary | ICD-10-CM | POA: Diagnosis not present

## 2012-10-27 DIAGNOSIS — M5137 Other intervertebral disc degeneration, lumbosacral region: Secondary | ICD-10-CM | POA: Diagnosis not present

## 2012-10-27 DIAGNOSIS — M999 Biomechanical lesion, unspecified: Secondary | ICD-10-CM | POA: Diagnosis not present

## 2012-10-29 DIAGNOSIS — M545 Low back pain, unspecified: Secondary | ICD-10-CM | POA: Diagnosis not present

## 2012-10-29 DIAGNOSIS — L821 Other seborrheic keratosis: Secondary | ICD-10-CM | POA: Diagnosis not present

## 2012-10-29 DIAGNOSIS — IMO0001 Reserved for inherently not codable concepts without codable children: Secondary | ICD-10-CM | POA: Diagnosis not present

## 2012-10-29 DIAGNOSIS — L723 Sebaceous cyst: Secondary | ICD-10-CM | POA: Diagnosis not present

## 2012-10-29 DIAGNOSIS — IMO0002 Reserved for concepts with insufficient information to code with codable children: Secondary | ICD-10-CM | POA: Diagnosis not present

## 2012-10-29 DIAGNOSIS — D1801 Hemangioma of skin and subcutaneous tissue: Secondary | ICD-10-CM | POA: Diagnosis not present

## 2012-10-29 DIAGNOSIS — M542 Cervicalgia: Secondary | ICD-10-CM | POA: Diagnosis not present

## 2012-10-29 DIAGNOSIS — M999 Biomechanical lesion, unspecified: Secondary | ICD-10-CM | POA: Diagnosis not present

## 2012-10-29 DIAGNOSIS — I789 Disease of capillaries, unspecified: Secondary | ICD-10-CM | POA: Diagnosis not present

## 2012-10-29 DIAGNOSIS — Z85828 Personal history of other malignant neoplasm of skin: Secondary | ICD-10-CM | POA: Diagnosis not present

## 2012-10-29 DIAGNOSIS — D485 Neoplasm of uncertain behavior of skin: Secondary | ICD-10-CM | POA: Diagnosis not present

## 2012-10-29 DIAGNOSIS — C44319 Basal cell carcinoma of skin of other parts of face: Secondary | ICD-10-CM | POA: Diagnosis not present

## 2012-10-29 DIAGNOSIS — L578 Other skin changes due to chronic exposure to nonionizing radiation: Secondary | ICD-10-CM | POA: Diagnosis not present

## 2012-10-29 DIAGNOSIS — L570 Actinic keratosis: Secondary | ICD-10-CM | POA: Diagnosis not present

## 2012-11-18 DIAGNOSIS — M25559 Pain in unspecified hip: Secondary | ICD-10-CM | POA: Diagnosis not present

## 2012-11-18 DIAGNOSIS — M5137 Other intervertebral disc degeneration, lumbosacral region: Secondary | ICD-10-CM | POA: Diagnosis not present

## 2012-11-18 DIAGNOSIS — M999 Biomechanical lesion, unspecified: Secondary | ICD-10-CM | POA: Diagnosis not present

## 2012-11-18 DIAGNOSIS — M5126 Other intervertebral disc displacement, lumbar region: Secondary | ICD-10-CM | POA: Diagnosis not present

## 2012-11-18 DIAGNOSIS — IMO0002 Reserved for concepts with insufficient information to code with codable children: Secondary | ICD-10-CM | POA: Diagnosis not present

## 2012-11-23 DIAGNOSIS — M999 Biomechanical lesion, unspecified: Secondary | ICD-10-CM | POA: Diagnosis not present

## 2012-11-23 DIAGNOSIS — IMO0002 Reserved for concepts with insufficient information to code with codable children: Secondary | ICD-10-CM | POA: Diagnosis not present

## 2012-11-23 DIAGNOSIS — M25559 Pain in unspecified hip: Secondary | ICD-10-CM | POA: Diagnosis not present

## 2012-11-23 DIAGNOSIS — M5137 Other intervertebral disc degeneration, lumbosacral region: Secondary | ICD-10-CM | POA: Diagnosis not present

## 2012-11-23 DIAGNOSIS — M5126 Other intervertebral disc displacement, lumbar region: Secondary | ICD-10-CM | POA: Diagnosis not present

## 2012-11-24 DIAGNOSIS — M5137 Other intervertebral disc degeneration, lumbosacral region: Secondary | ICD-10-CM | POA: Diagnosis not present

## 2012-11-24 DIAGNOSIS — M5126 Other intervertebral disc displacement, lumbar region: Secondary | ICD-10-CM | POA: Diagnosis not present

## 2012-11-24 DIAGNOSIS — M25559 Pain in unspecified hip: Secondary | ICD-10-CM | POA: Diagnosis not present

## 2012-11-24 DIAGNOSIS — IMO0002 Reserved for concepts with insufficient information to code with codable children: Secondary | ICD-10-CM | POA: Diagnosis not present

## 2012-11-24 DIAGNOSIS — M999 Biomechanical lesion, unspecified: Secondary | ICD-10-CM | POA: Diagnosis not present

## 2012-11-25 DIAGNOSIS — M999 Biomechanical lesion, unspecified: Secondary | ICD-10-CM | POA: Diagnosis not present

## 2012-11-25 DIAGNOSIS — M5137 Other intervertebral disc degeneration, lumbosacral region: Secondary | ICD-10-CM | POA: Diagnosis not present

## 2012-11-25 DIAGNOSIS — IMO0002 Reserved for concepts with insufficient information to code with codable children: Secondary | ICD-10-CM | POA: Diagnosis not present

## 2012-11-25 DIAGNOSIS — M5126 Other intervertebral disc displacement, lumbar region: Secondary | ICD-10-CM | POA: Diagnosis not present

## 2012-11-25 DIAGNOSIS — M25559 Pain in unspecified hip: Secondary | ICD-10-CM | POA: Diagnosis not present

## 2012-11-30 DIAGNOSIS — M5137 Other intervertebral disc degeneration, lumbosacral region: Secondary | ICD-10-CM | POA: Diagnosis not present

## 2012-11-30 DIAGNOSIS — IMO0002 Reserved for concepts with insufficient information to code with codable children: Secondary | ICD-10-CM | POA: Diagnosis not present

## 2012-11-30 DIAGNOSIS — M999 Biomechanical lesion, unspecified: Secondary | ICD-10-CM | POA: Diagnosis not present

## 2012-11-30 DIAGNOSIS — M5126 Other intervertebral disc displacement, lumbar region: Secondary | ICD-10-CM | POA: Diagnosis not present

## 2012-11-30 DIAGNOSIS — M25559 Pain in unspecified hip: Secondary | ICD-10-CM | POA: Diagnosis not present

## 2012-12-01 DIAGNOSIS — IMO0002 Reserved for concepts with insufficient information to code with codable children: Secondary | ICD-10-CM | POA: Diagnosis not present

## 2012-12-01 DIAGNOSIS — M25559 Pain in unspecified hip: Secondary | ICD-10-CM | POA: Diagnosis not present

## 2012-12-01 DIAGNOSIS — M999 Biomechanical lesion, unspecified: Secondary | ICD-10-CM | POA: Diagnosis not present

## 2012-12-01 DIAGNOSIS — M5137 Other intervertebral disc degeneration, lumbosacral region: Secondary | ICD-10-CM | POA: Diagnosis not present

## 2012-12-01 DIAGNOSIS — M5126 Other intervertebral disc displacement, lumbar region: Secondary | ICD-10-CM | POA: Diagnosis not present

## 2012-12-03 DIAGNOSIS — M999 Biomechanical lesion, unspecified: Secondary | ICD-10-CM | POA: Diagnosis not present

## 2012-12-03 DIAGNOSIS — M5137 Other intervertebral disc degeneration, lumbosacral region: Secondary | ICD-10-CM | POA: Diagnosis not present

## 2012-12-03 DIAGNOSIS — M25559 Pain in unspecified hip: Secondary | ICD-10-CM | POA: Diagnosis not present

## 2012-12-03 DIAGNOSIS — M5126 Other intervertebral disc displacement, lumbar region: Secondary | ICD-10-CM | POA: Diagnosis not present

## 2012-12-03 DIAGNOSIS — IMO0002 Reserved for concepts with insufficient information to code with codable children: Secondary | ICD-10-CM | POA: Diagnosis not present

## 2012-12-15 DIAGNOSIS — M5137 Other intervertebral disc degeneration, lumbosacral region: Secondary | ICD-10-CM | POA: Diagnosis not present

## 2012-12-15 DIAGNOSIS — IMO0002 Reserved for concepts with insufficient information to code with codable children: Secondary | ICD-10-CM | POA: Diagnosis not present

## 2012-12-15 DIAGNOSIS — M5126 Other intervertebral disc displacement, lumbar region: Secondary | ICD-10-CM | POA: Diagnosis not present

## 2012-12-15 DIAGNOSIS — M25559 Pain in unspecified hip: Secondary | ICD-10-CM | POA: Diagnosis not present

## 2012-12-15 DIAGNOSIS — M999 Biomechanical lesion, unspecified: Secondary | ICD-10-CM | POA: Diagnosis not present

## 2012-12-16 DIAGNOSIS — M999 Biomechanical lesion, unspecified: Secondary | ICD-10-CM | POA: Diagnosis not present

## 2012-12-16 DIAGNOSIS — M5137 Other intervertebral disc degeneration, lumbosacral region: Secondary | ICD-10-CM | POA: Diagnosis not present

## 2012-12-16 DIAGNOSIS — M5126 Other intervertebral disc displacement, lumbar region: Secondary | ICD-10-CM | POA: Diagnosis not present

## 2012-12-16 DIAGNOSIS — IMO0002 Reserved for concepts with insufficient information to code with codable children: Secondary | ICD-10-CM | POA: Diagnosis not present

## 2012-12-16 DIAGNOSIS — M25559 Pain in unspecified hip: Secondary | ICD-10-CM | POA: Diagnosis not present

## 2012-12-21 DIAGNOSIS — M999 Biomechanical lesion, unspecified: Secondary | ICD-10-CM | POA: Diagnosis not present

## 2012-12-21 DIAGNOSIS — M25559 Pain in unspecified hip: Secondary | ICD-10-CM | POA: Diagnosis not present

## 2012-12-21 DIAGNOSIS — IMO0002 Reserved for concepts with insufficient information to code with codable children: Secondary | ICD-10-CM | POA: Diagnosis not present

## 2012-12-21 DIAGNOSIS — M5126 Other intervertebral disc displacement, lumbar region: Secondary | ICD-10-CM | POA: Diagnosis not present

## 2012-12-21 DIAGNOSIS — M5137 Other intervertebral disc degeneration, lumbosacral region: Secondary | ICD-10-CM | POA: Diagnosis not present

## 2012-12-22 DIAGNOSIS — M25559 Pain in unspecified hip: Secondary | ICD-10-CM | POA: Diagnosis not present

## 2012-12-22 DIAGNOSIS — M5126 Other intervertebral disc displacement, lumbar region: Secondary | ICD-10-CM | POA: Diagnosis not present

## 2012-12-22 DIAGNOSIS — IMO0002 Reserved for concepts with insufficient information to code with codable children: Secondary | ICD-10-CM | POA: Diagnosis not present

## 2012-12-22 DIAGNOSIS — M999 Biomechanical lesion, unspecified: Secondary | ICD-10-CM | POA: Diagnosis not present

## 2012-12-22 DIAGNOSIS — M5137 Other intervertebral disc degeneration, lumbosacral region: Secondary | ICD-10-CM | POA: Diagnosis not present

## 2012-12-24 DIAGNOSIS — M5137 Other intervertebral disc degeneration, lumbosacral region: Secondary | ICD-10-CM | POA: Diagnosis not present

## 2012-12-24 DIAGNOSIS — M5126 Other intervertebral disc displacement, lumbar region: Secondary | ICD-10-CM | POA: Diagnosis not present

## 2012-12-24 DIAGNOSIS — M999 Biomechanical lesion, unspecified: Secondary | ICD-10-CM | POA: Diagnosis not present

## 2012-12-24 DIAGNOSIS — M25559 Pain in unspecified hip: Secondary | ICD-10-CM | POA: Diagnosis not present

## 2012-12-24 DIAGNOSIS — IMO0002 Reserved for concepts with insufficient information to code with codable children: Secondary | ICD-10-CM | POA: Diagnosis not present

## 2012-12-28 DIAGNOSIS — M999 Biomechanical lesion, unspecified: Secondary | ICD-10-CM | POA: Diagnosis not present

## 2012-12-28 DIAGNOSIS — M5126 Other intervertebral disc displacement, lumbar region: Secondary | ICD-10-CM | POA: Diagnosis not present

## 2012-12-28 DIAGNOSIS — M25559 Pain in unspecified hip: Secondary | ICD-10-CM | POA: Diagnosis not present

## 2012-12-28 DIAGNOSIS — M5137 Other intervertebral disc degeneration, lumbosacral region: Secondary | ICD-10-CM | POA: Diagnosis not present

## 2012-12-28 DIAGNOSIS — IMO0002 Reserved for concepts with insufficient information to code with codable children: Secondary | ICD-10-CM | POA: Diagnosis not present

## 2012-12-30 DIAGNOSIS — M5137 Other intervertebral disc degeneration, lumbosacral region: Secondary | ICD-10-CM | POA: Diagnosis not present

## 2012-12-30 DIAGNOSIS — M25559 Pain in unspecified hip: Secondary | ICD-10-CM | POA: Diagnosis not present

## 2012-12-30 DIAGNOSIS — IMO0002 Reserved for concepts with insufficient information to code with codable children: Secondary | ICD-10-CM | POA: Diagnosis not present

## 2012-12-30 DIAGNOSIS — M5126 Other intervertebral disc displacement, lumbar region: Secondary | ICD-10-CM | POA: Diagnosis not present

## 2012-12-30 DIAGNOSIS — M999 Biomechanical lesion, unspecified: Secondary | ICD-10-CM | POA: Diagnosis not present

## 2013-01-04 DIAGNOSIS — M5137 Other intervertebral disc degeneration, lumbosacral region: Secondary | ICD-10-CM | POA: Diagnosis not present

## 2013-01-04 DIAGNOSIS — M25559 Pain in unspecified hip: Secondary | ICD-10-CM | POA: Diagnosis not present

## 2013-01-04 DIAGNOSIS — IMO0002 Reserved for concepts with insufficient information to code with codable children: Secondary | ICD-10-CM | POA: Diagnosis not present

## 2013-01-04 DIAGNOSIS — M5126 Other intervertebral disc displacement, lumbar region: Secondary | ICD-10-CM | POA: Diagnosis not present

## 2013-01-04 DIAGNOSIS — M999 Biomechanical lesion, unspecified: Secondary | ICD-10-CM | POA: Diagnosis not present

## 2013-01-06 DIAGNOSIS — M999 Biomechanical lesion, unspecified: Secondary | ICD-10-CM | POA: Diagnosis not present

## 2013-01-06 DIAGNOSIS — IMO0002 Reserved for concepts with insufficient information to code with codable children: Secondary | ICD-10-CM | POA: Diagnosis not present

## 2013-01-06 DIAGNOSIS — M25559 Pain in unspecified hip: Secondary | ICD-10-CM | POA: Diagnosis not present

## 2013-01-06 DIAGNOSIS — M5137 Other intervertebral disc degeneration, lumbosacral region: Secondary | ICD-10-CM | POA: Diagnosis not present

## 2013-01-06 DIAGNOSIS — M5126 Other intervertebral disc displacement, lumbar region: Secondary | ICD-10-CM | POA: Diagnosis not present

## 2013-01-12 DIAGNOSIS — M999 Biomechanical lesion, unspecified: Secondary | ICD-10-CM | POA: Diagnosis not present

## 2013-01-12 DIAGNOSIS — M5137 Other intervertebral disc degeneration, lumbosacral region: Secondary | ICD-10-CM | POA: Diagnosis not present

## 2013-01-12 DIAGNOSIS — IMO0002 Reserved for concepts with insufficient information to code with codable children: Secondary | ICD-10-CM | POA: Diagnosis not present

## 2013-01-12 DIAGNOSIS — M5126 Other intervertebral disc displacement, lumbar region: Secondary | ICD-10-CM | POA: Diagnosis not present

## 2013-01-12 DIAGNOSIS — M25559 Pain in unspecified hip: Secondary | ICD-10-CM | POA: Diagnosis not present

## 2013-01-14 DIAGNOSIS — M25559 Pain in unspecified hip: Secondary | ICD-10-CM | POA: Diagnosis not present

## 2013-01-14 DIAGNOSIS — M5126 Other intervertebral disc displacement, lumbar region: Secondary | ICD-10-CM | POA: Diagnosis not present

## 2013-01-14 DIAGNOSIS — IMO0002 Reserved for concepts with insufficient information to code with codable children: Secondary | ICD-10-CM | POA: Diagnosis not present

## 2013-01-14 DIAGNOSIS — M999 Biomechanical lesion, unspecified: Secondary | ICD-10-CM | POA: Diagnosis not present

## 2013-01-14 DIAGNOSIS — M5137 Other intervertebral disc degeneration, lumbosacral region: Secondary | ICD-10-CM | POA: Diagnosis not present

## 2013-01-26 DIAGNOSIS — M5137 Other intervertebral disc degeneration, lumbosacral region: Secondary | ICD-10-CM | POA: Diagnosis not present

## 2013-01-26 DIAGNOSIS — M25559 Pain in unspecified hip: Secondary | ICD-10-CM | POA: Diagnosis not present

## 2013-01-26 DIAGNOSIS — M999 Biomechanical lesion, unspecified: Secondary | ICD-10-CM | POA: Diagnosis not present

## 2013-01-26 DIAGNOSIS — M5126 Other intervertebral disc displacement, lumbar region: Secondary | ICD-10-CM | POA: Diagnosis not present

## 2013-01-26 DIAGNOSIS — IMO0002 Reserved for concepts with insufficient information to code with codable children: Secondary | ICD-10-CM | POA: Diagnosis not present

## 2013-02-09 DIAGNOSIS — M5126 Other intervertebral disc displacement, lumbar region: Secondary | ICD-10-CM | POA: Diagnosis not present

## 2013-02-09 DIAGNOSIS — M999 Biomechanical lesion, unspecified: Secondary | ICD-10-CM | POA: Diagnosis not present

## 2013-02-09 DIAGNOSIS — M5137 Other intervertebral disc degeneration, lumbosacral region: Secondary | ICD-10-CM | POA: Diagnosis not present

## 2013-02-09 DIAGNOSIS — IMO0002 Reserved for concepts with insufficient information to code with codable children: Secondary | ICD-10-CM | POA: Diagnosis not present

## 2013-02-09 DIAGNOSIS — M25559 Pain in unspecified hip: Secondary | ICD-10-CM | POA: Diagnosis not present

## 2013-03-02 DIAGNOSIS — M25559 Pain in unspecified hip: Secondary | ICD-10-CM | POA: Diagnosis not present

## 2013-03-02 DIAGNOSIS — M5137 Other intervertebral disc degeneration, lumbosacral region: Secondary | ICD-10-CM | POA: Diagnosis not present

## 2013-03-02 DIAGNOSIS — IMO0002 Reserved for concepts with insufficient information to code with codable children: Secondary | ICD-10-CM | POA: Diagnosis not present

## 2013-03-02 DIAGNOSIS — M999 Biomechanical lesion, unspecified: Secondary | ICD-10-CM | POA: Diagnosis not present

## 2013-03-02 DIAGNOSIS — M5126 Other intervertebral disc displacement, lumbar region: Secondary | ICD-10-CM | POA: Diagnosis not present

## 2013-03-29 DIAGNOSIS — C44319 Basal cell carcinoma of skin of other parts of face: Secondary | ICD-10-CM | POA: Diagnosis not present

## 2013-03-30 DIAGNOSIS — M5137 Other intervertebral disc degeneration, lumbosacral region: Secondary | ICD-10-CM | POA: Diagnosis not present

## 2013-03-30 DIAGNOSIS — M48061 Spinal stenosis, lumbar region without neurogenic claudication: Secondary | ICD-10-CM | POA: Diagnosis not present

## 2013-03-30 DIAGNOSIS — M5430 Sciatica, unspecified side: Secondary | ICD-10-CM | POA: Diagnosis not present

## 2013-04-15 ENCOUNTER — Other Ambulatory Visit: Payer: Self-pay

## 2013-05-10 DIAGNOSIS — H2510 Age-related nuclear cataract, unspecified eye: Secondary | ICD-10-CM | POA: Diagnosis not present

## 2013-05-10 DIAGNOSIS — H40009 Preglaucoma, unspecified, unspecified eye: Secondary | ICD-10-CM | POA: Diagnosis not present

## 2013-05-21 ENCOUNTER — Telehealth: Payer: Self-pay

## 2013-05-21 NOTE — Telephone Encounter (Signed)
Left message for call back Non identifiable  CCS--03/2009--Dr Hayes--repeat 3-5 years PSA--05/2012--1.07 Tdap--05/2010

## 2013-05-24 ENCOUNTER — Ambulatory Visit (INDEPENDENT_AMBULATORY_CARE_PROVIDER_SITE_OTHER): Payer: Medicare Other | Admitting: Internal Medicine

## 2013-05-24 ENCOUNTER — Encounter: Payer: Self-pay | Admitting: Internal Medicine

## 2013-05-24 VITALS — BP 105/70 | HR 80 | Temp 98.0°F | Resp 13 | Ht 72.5 in | Wt 215.6 lb

## 2013-05-24 DIAGNOSIS — Z860100 Personal history of colon polyps, unspecified: Secondary | ICD-10-CM

## 2013-05-24 DIAGNOSIS — I10 Essential (primary) hypertension: Secondary | ICD-10-CM | POA: Diagnosis not present

## 2013-05-24 DIAGNOSIS — Z0000 Encounter for general adult medical examination without abnormal findings: Secondary | ICD-10-CM | POA: Diagnosis not present

## 2013-05-24 DIAGNOSIS — Z125 Encounter for screening for malignant neoplasm of prostate: Secondary | ICD-10-CM

## 2013-05-24 DIAGNOSIS — R7309 Other abnormal glucose: Secondary | ICD-10-CM

## 2013-05-24 DIAGNOSIS — Z8042 Family history of malignant neoplasm of prostate: Secondary | ICD-10-CM

## 2013-05-24 DIAGNOSIS — E785 Hyperlipidemia, unspecified: Secondary | ICD-10-CM

## 2013-05-24 LAB — CBC WITH DIFFERENTIAL/PLATELET
Basophils Absolute: 0 10*3/uL (ref 0.0–0.1)
Basophils Relative: 0.4 % (ref 0.0–3.0)
Eosinophils Absolute: 0.2 10*3/uL (ref 0.0–0.7)
Eosinophils Relative: 2 % (ref 0.0–5.0)
HCT: 41.7 % (ref 39.0–52.0)
Hemoglobin: 14 g/dL (ref 13.0–17.0)
Lymphocytes Relative: 40.9 % (ref 12.0–46.0)
Lymphs Abs: 3.4 10*3/uL (ref 0.7–4.0)
MCHC: 33.6 g/dL (ref 30.0–36.0)
MCV: 96.2 fl (ref 78.0–100.0)
Monocytes Absolute: 0.6 10*3/uL (ref 0.1–1.0)
Monocytes Relative: 7.8 % (ref 3.0–12.0)
Neutro Abs: 4 10*3/uL (ref 1.4–7.7)
Neutrophils Relative %: 48.9 % (ref 43.0–77.0)
Platelets: 236 10*3/uL (ref 150.0–400.0)
RBC: 4.34 Mil/uL (ref 4.22–5.81)
RDW: 13.4 % (ref 11.5–14.6)
WBC: 8.2 10*3/uL (ref 4.5–10.5)

## 2013-05-24 LAB — BASIC METABOLIC PANEL
BUN: 22 mg/dL (ref 6–23)
CO2: 27 mEq/L (ref 19–32)
Calcium: 9.1 mg/dL (ref 8.4–10.5)
Chloride: 101 mEq/L (ref 96–112)
Creatinine, Ser: 1 mg/dL (ref 0.4–1.5)
GFR: 82.73 mL/min (ref >60.00)
Glucose, Bld: 118 mg/dL — ABNORMAL HIGH (ref 70–99)
Potassium: 4.8 mEq/L (ref 3.5–5.1)
Sodium: 137 mEq/L (ref 135–145)

## 2013-05-24 LAB — TSH: TSH: 1.84 u[IU]/mL (ref 0.35–5.50)

## 2013-05-24 LAB — HEPATIC FUNCTION PANEL
ALT: 18 U/L (ref 0–53)
AST: 19 U/L (ref 0–37)
Albumin: 3.9 g/dL (ref 3.5–5.2)
Alkaline Phosphatase: 70 U/L (ref 39–117)
Bilirubin, Direct: 0.1 mg/dL (ref 0.0–0.3)
Total Bilirubin: 0.9 mg/dL (ref 0.3–1.2)
Total Protein: 6.9 g/dL (ref 6.0–8.3)

## 2013-05-24 LAB — LIPID PANEL
Cholesterol: 230 mg/dL — ABNORMAL HIGH (ref 0–200)
HDL: 48.4 mg/dL (ref >39.00)
Total CHOL/HDL Ratio: 5
Triglycerides: 136 mg/dL (ref 0.0–149.0)
VLDL: 27.2 mg/dL (ref 0.0–40.0)

## 2013-05-24 LAB — PSA: PSA: 0.6 ng/mL (ref 0.10–4.00)

## 2013-05-24 LAB — HEMOGLOBIN A1C: Hgb A1c MFr Bld: 6 % (ref 4.6–6.5)

## 2013-05-24 LAB — LDL CHOLESTEROL, DIRECT: Direct LDL: 175.4 mg/dL

## 2013-05-24 NOTE — Progress Notes (Signed)
Subjective:    Patient ID: Roger Stewart, male    DOB: 08/01/1945, 67 y.o.   MRN: [id number]  HPI Medicare Wellness Visit: Psychosocial and medical history were reviewed as required by Medicare (history related to abuse, antisocial behavior , firearm risk). Social history: Caffeine: none , Alcohol:14 drinks / week  , Tobacco use:1 cigar / day Exercise:see below Personal safety/fall risk:no Limitations of activities of daily living:no Seatbelt/ smoke alarm use:yes Healthcare Power of Attorney/Living Will status: in place Ophthalmologic exam status:current Hearing evaluation status: not current Orientation: Oriented X 3 Memory and recall: good Spelling or math testing: good Depression/anxiety assessment: no Foreign travel history:no Immunization status for influenza/pneumonia/ shingles /tetanus:declined (SOC reviewed) Transfusion history: Preventive health care maintenance status: Colonoscopy as per protocol/standard care:due 2015 Dental care:every 4 mos Chart reviewed and updated. Active issues reviewed and addressed as documented below.    Review of Systems A heart healthy diet is followed; exercise encompasses 30-40 minutes 3-4  times per week as CV & weights/stretching without symptoms.  Family history is negative for premature coronary disease. Advanced cholesterol testing reveals  LDL goal is less than 100 ; ideally < 70 . No statin to date.  Low dose ASA taken Specifically denied are  chest pain, palpitations, dyspnea,PND or claudication.  BP @ home 120s-130s/85-87. Toprol dose is 12.5 mg bid.      Objective:   Physical Exam Gen.: Healthy and well-nourished in appearance. Alert, appropriate and cooperative throughout exam. Head: Normocephalic without obvious abnormalities  Eyes: No corneal or conjunctival inflammation noted. Pupils equal round reactive to light and accommodation. Extraocular motion intact. Some lid lag Ears: External  ear exam reveals no significant  lesions or deformities. Wax bilaterally. Hearing is grossly normal bilaterally. Nose: External nasal exam reveals no deformity or inflammation. Nasal mucosa are pink and moist. No lesions or exudates noted.  Mouth: Oral mucosa and oropharynx reveal no lesions or exudates. Teeth in good repair. Neck: No deformities, masses, or tenderness noted. Range of motion& Thyroid normal. Lungs: Normal respiratory effort; chest expands symmetrically. Lungs are clear to auscultation without rales, wheezes, or increased work of breathing.Distant BS Heart: Normal rate and rhythm. Normal S1 and S2. No gallop, click, or rub. S4 w/o murmur. Sounds distant Abdomen: Bowel sounds normal; abdomen soft and nontender. No masses, organomegaly or hernias noted.AAA op scar well healed Genitalia: Genitalia normal except for left varices & some testicular atrophy. Prostate is normal without enlargement, asymmetry, nodularity, or induration                                  Musculoskeletal/extremities: No deformity or scoliosis noted of  the thoracic or lumbar spine.  No clubbing, cyanosis, edema, or significant extremity  deformity noted. Range of motion normal .Tone & strength normal. Hand joints normal OR reveal mild  DJD DIP changes. Fingernail  health good. Able to lie down & sit up w/o help. Negative SLR bilaterally Vascular: Carotid, radial artery, dorsalis pedis and  posterior tibial pulses are  equal. Decreased pedal pulses.No bruits present. Neurologic: Alert and oriented x3. Deep tendon reflexes symmetrical and normal.        Skin: Intact without suspicious lesions or rashes. Lymph: No cervical, axillary, or inguinal lymphadenopathy present. Psych: Mood and affect are normal. Normally interactive  Assessment & Plan:  #1 Medicare Wellness Exam; criteria met ; data entered #2 Problem List/Diagnoses reviewed Plan:  Assessments  made/ Orders entered

## 2013-05-24 NOTE — Progress Notes (Signed)
Pre visit review using our clinic review tool, if applicable. No additional management support is needed unless otherwise documented below in the visit note. 

## 2013-05-24 NOTE — Patient Instructions (Addendum)
Your next office appointment will be determined based upon review of your pending labs . Those instructions will be transmitted to you through My Chart  or by mail if you're not using this system.  Please do not use Q-tips as we discussed. Should wax build up occur, please put 2-3 drops of mineral oil in the affected  ear at night to soften the wax .Cover the canal with a  cotton ball to prevent the oil from staining bed linens. In the morning fill the ear canal with hydrogen peroxide & lie in the opposite lateral decubitus position(on the side opposite the affected ear)  for 10-15 minutes. After allowing this period of time for the peroxide to dissolve the wax ;shower and use the thinnest washrag available to wick out the wax. If both ears are involved ; alternate this treatment from ear to ear each night until no wax is found on the washrag. Share results with all non [facility] medical staff seen

## 2013-05-24 NOTE — Telephone Encounter (Signed)
Unable to reach pre visit.  

## 2013-06-25 IMAGING — CR DG CHEST 2V
2 series · 2 of 2 positions shown · non-contrast
Comparison: [DATE] and 06/22/2003

CLINICAL DATA: Cough.

CHEST - 2 VIEW

[view not recorded (1 of 2)]
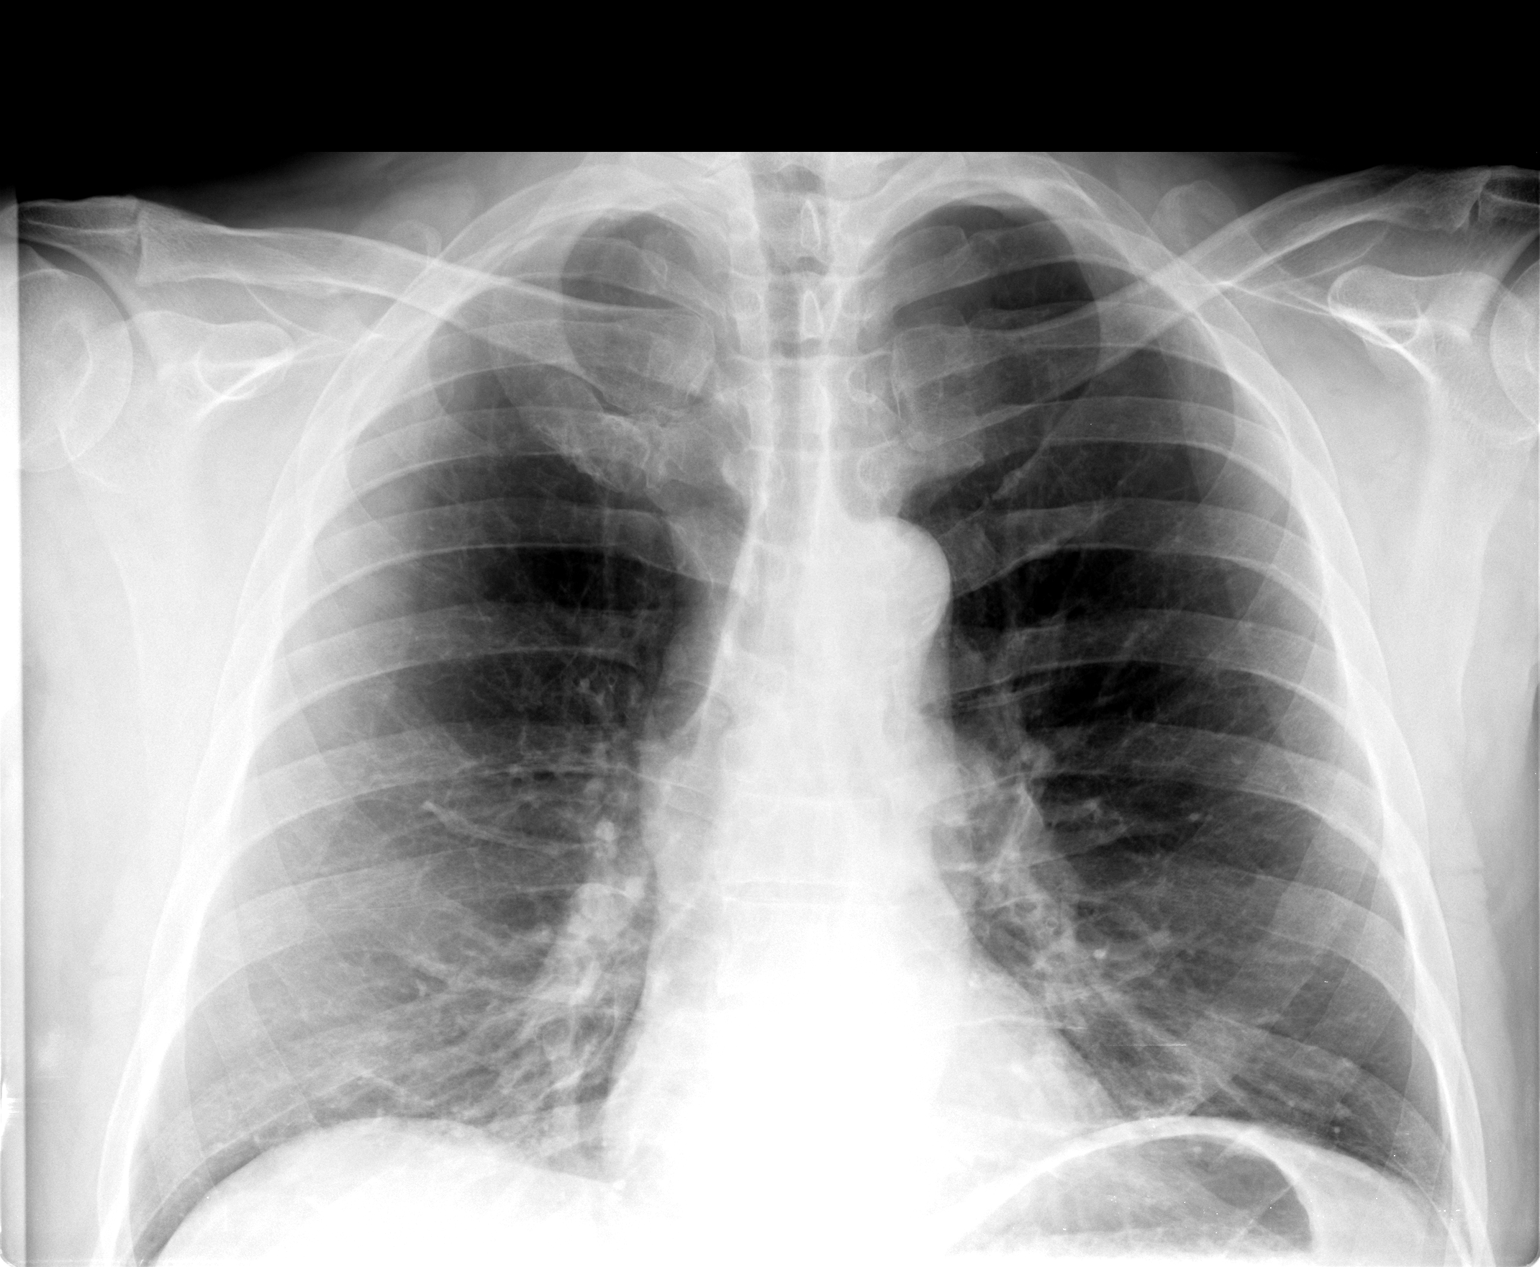

[view not recorded (2 of 2)]
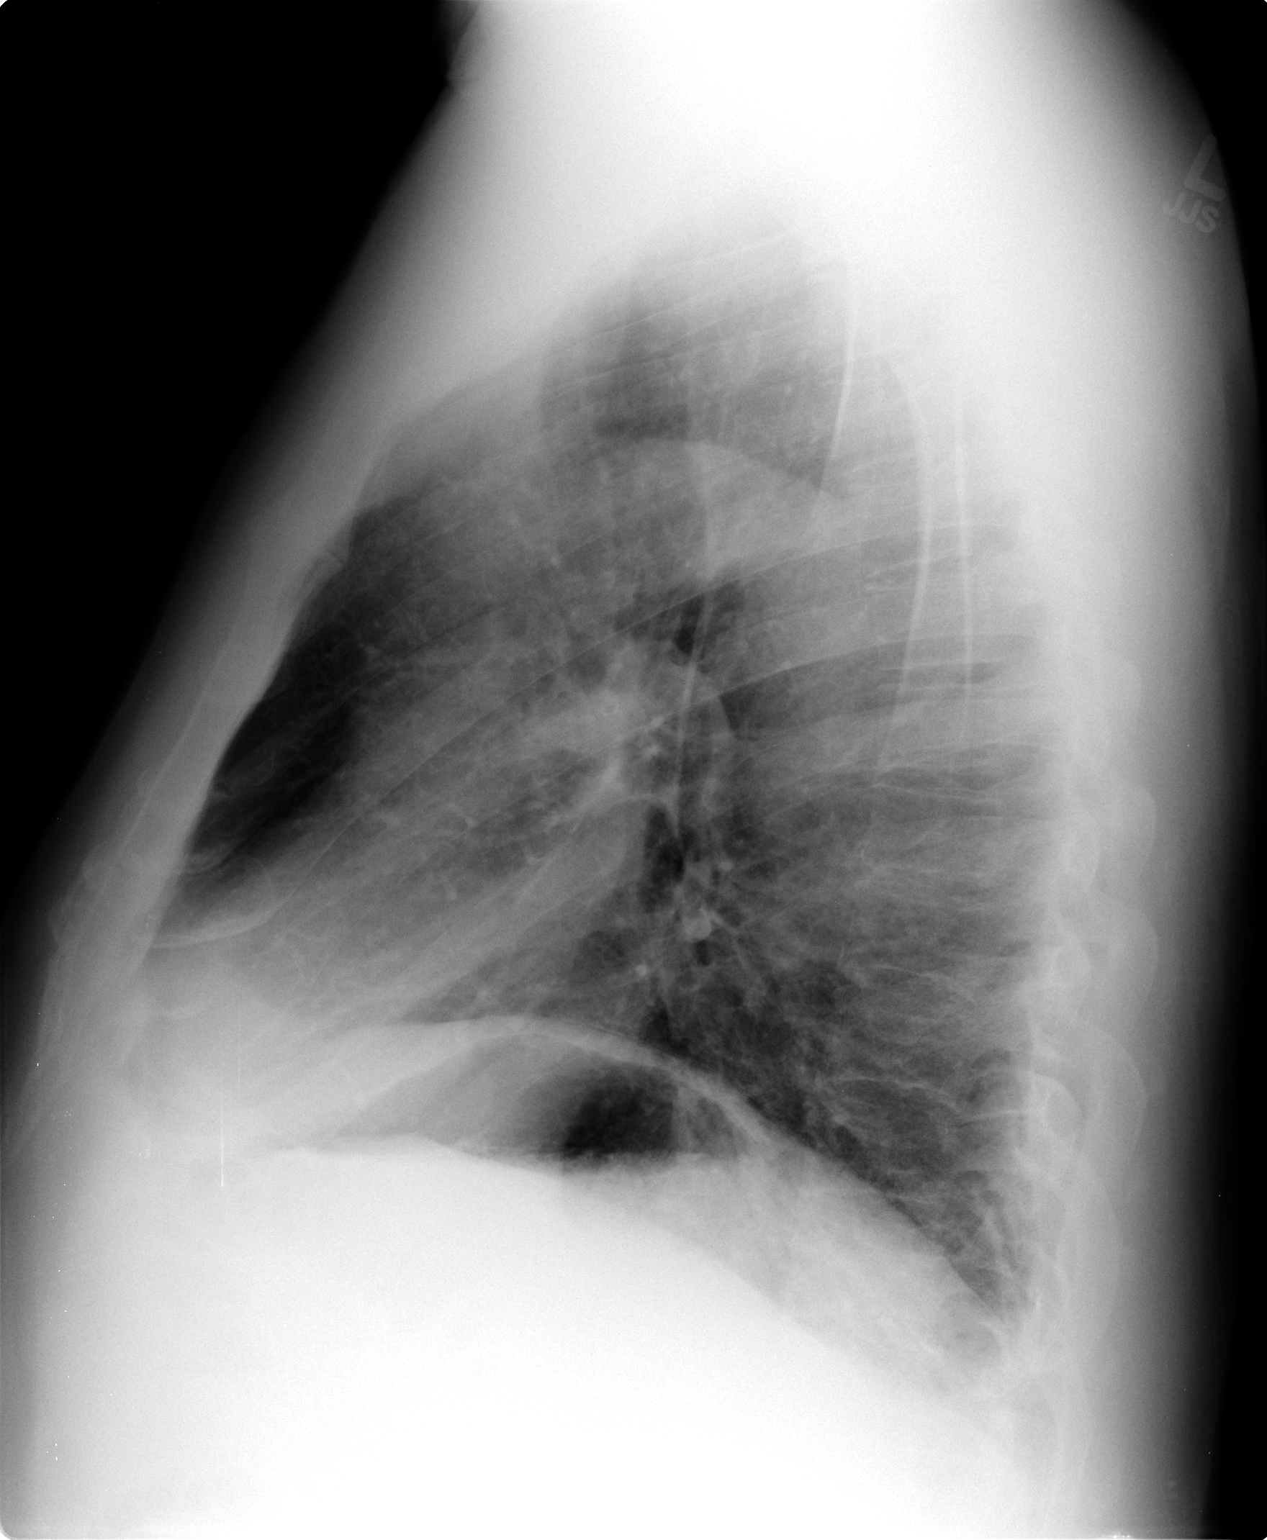

[2 of 2 positions shown; findings below may reference images not displayed]

FINDINGS: The patient has slight peribronchial thickening
consistent with bronchitis.  Heart size and vascularity are normal.
No infiltrates or effusions.  No significant osseous abnormality.
IMPRESSION: Mild bronchitic changes.

## 2013-07-26 ENCOUNTER — Other Ambulatory Visit: Payer: Self-pay | Admitting: Internal Medicine

## 2013-07-28 NOTE — Telephone Encounter (Signed)
Rx sent to the pharmacy by e-script.//AB/CMA 

## 2013-08-24 DIAGNOSIS — Z85828 Personal history of other malignant neoplasm of skin: Secondary | ICD-10-CM | POA: Diagnosis not present

## 2013-08-24 DIAGNOSIS — L570 Actinic keratosis: Secondary | ICD-10-CM | POA: Diagnosis not present

## 2013-08-24 DIAGNOSIS — L723 Sebaceous cyst: Secondary | ICD-10-CM | POA: Diagnosis not present

## 2013-08-27 DIAGNOSIS — M542 Cervicalgia: Secondary | ICD-10-CM | POA: Diagnosis not present

## 2013-08-27 DIAGNOSIS — M62838 Other muscle spasm: Secondary | ICD-10-CM | POA: Diagnosis not present

## 2013-08-30 DIAGNOSIS — M62838 Other muscle spasm: Secondary | ICD-10-CM | POA: Diagnosis not present

## 2013-08-30 DIAGNOSIS — M542 Cervicalgia: Secondary | ICD-10-CM | POA: Diagnosis not present

## 2013-08-31 DIAGNOSIS — M542 Cervicalgia: Secondary | ICD-10-CM | POA: Diagnosis not present

## 2013-08-31 DIAGNOSIS — M62838 Other muscle spasm: Secondary | ICD-10-CM | POA: Diagnosis not present

## 2013-09-01 DIAGNOSIS — M542 Cervicalgia: Secondary | ICD-10-CM | POA: Diagnosis not present

## 2013-09-01 DIAGNOSIS — M62838 Other muscle spasm: Secondary | ICD-10-CM | POA: Diagnosis not present

## 2013-09-09 DIAGNOSIS — M542 Cervicalgia: Secondary | ICD-10-CM | POA: Diagnosis not present

## 2013-09-09 DIAGNOSIS — M62838 Other muscle spasm: Secondary | ICD-10-CM | POA: Diagnosis not present

## 2013-09-14 DIAGNOSIS — M542 Cervicalgia: Secondary | ICD-10-CM | POA: Diagnosis not present

## 2013-09-14 DIAGNOSIS — M62838 Other muscle spasm: Secondary | ICD-10-CM | POA: Diagnosis not present

## 2013-09-16 DIAGNOSIS — M62838 Other muscle spasm: Secondary | ICD-10-CM | POA: Diagnosis not present

## 2013-09-16 DIAGNOSIS — M542 Cervicalgia: Secondary | ICD-10-CM | POA: Diagnosis not present

## 2013-09-21 DIAGNOSIS — M62838 Other muscle spasm: Secondary | ICD-10-CM | POA: Diagnosis not present

## 2013-09-21 DIAGNOSIS — M542 Cervicalgia: Secondary | ICD-10-CM | POA: Diagnosis not present

## 2013-10-13 DIAGNOSIS — M62838 Other muscle spasm: Secondary | ICD-10-CM | POA: Diagnosis not present

## 2013-10-13 DIAGNOSIS — M542 Cervicalgia: Secondary | ICD-10-CM | POA: Diagnosis not present

## 2013-10-31 ENCOUNTER — Encounter: Payer: Self-pay | Admitting: Internal Medicine

## 2013-11-16 DIAGNOSIS — H40009 Preglaucoma, unspecified, unspecified eye: Secondary | ICD-10-CM | POA: Diagnosis not present

## 2014-01-31 ENCOUNTER — Other Ambulatory Visit: Payer: Self-pay | Admitting: Internal Medicine

## 2014-02-08 ENCOUNTER — Other Ambulatory Visit: Payer: Self-pay | Admitting: Internal Medicine

## 2014-03-25 ENCOUNTER — Other Ambulatory Visit: Payer: Self-pay

## 2014-05-10 DIAGNOSIS — M9905 Segmental and somatic dysfunction of pelvic region: Secondary | ICD-10-CM | POA: Diagnosis not present

## 2014-05-10 DIAGNOSIS — M25552 Pain in left hip: Secondary | ICD-10-CM | POA: Diagnosis not present

## 2014-05-10 DIAGNOSIS — M5136 Other intervertebral disc degeneration, lumbar region: Secondary | ICD-10-CM | POA: Diagnosis not present

## 2014-05-10 DIAGNOSIS — M5414 Radiculopathy, thoracic region: Secondary | ICD-10-CM | POA: Diagnosis not present

## 2014-05-10 DIAGNOSIS — M9904 Segmental and somatic dysfunction of sacral region: Secondary | ICD-10-CM | POA: Diagnosis not present

## 2014-05-10 DIAGNOSIS — M9903 Segmental and somatic dysfunction of lumbar region: Secondary | ICD-10-CM | POA: Diagnosis not present

## 2014-05-10 DIAGNOSIS — M9901 Segmental and somatic dysfunction of cervical region: Secondary | ICD-10-CM | POA: Diagnosis not present

## 2014-05-10 DIAGNOSIS — M5032 Other cervical disc degeneration, mid-cervical region: Secondary | ICD-10-CM | POA: Diagnosis not present

## 2014-05-12 DIAGNOSIS — M9901 Segmental and somatic dysfunction of cervical region: Secondary | ICD-10-CM | POA: Diagnosis not present

## 2014-05-12 DIAGNOSIS — M5414 Radiculopathy, thoracic region: Secondary | ICD-10-CM | POA: Diagnosis not present

## 2014-05-12 DIAGNOSIS — M25552 Pain in left hip: Secondary | ICD-10-CM | POA: Diagnosis not present

## 2014-05-12 DIAGNOSIS — M5136 Other intervertebral disc degeneration, lumbar region: Secondary | ICD-10-CM | POA: Diagnosis not present

## 2014-05-12 DIAGNOSIS — M9904 Segmental and somatic dysfunction of sacral region: Secondary | ICD-10-CM | POA: Diagnosis not present

## 2014-05-12 DIAGNOSIS — M9905 Segmental and somatic dysfunction of pelvic region: Secondary | ICD-10-CM | POA: Diagnosis not present

## 2014-05-12 DIAGNOSIS — M9903 Segmental and somatic dysfunction of lumbar region: Secondary | ICD-10-CM | POA: Diagnosis not present

## 2014-05-12 DIAGNOSIS — M5032 Other cervical disc degeneration, mid-cervical region: Secondary | ICD-10-CM | POA: Diagnosis not present

## 2014-05-16 DIAGNOSIS — M9901 Segmental and somatic dysfunction of cervical region: Secondary | ICD-10-CM | POA: Diagnosis not present

## 2014-05-16 DIAGNOSIS — M5414 Radiculopathy, thoracic region: Secondary | ICD-10-CM | POA: Diagnosis not present

## 2014-05-16 DIAGNOSIS — M9905 Segmental and somatic dysfunction of pelvic region: Secondary | ICD-10-CM | POA: Diagnosis not present

## 2014-05-16 DIAGNOSIS — M5136 Other intervertebral disc degeneration, lumbar region: Secondary | ICD-10-CM | POA: Diagnosis not present

## 2014-05-16 DIAGNOSIS — M9904 Segmental and somatic dysfunction of sacral region: Secondary | ICD-10-CM | POA: Diagnosis not present

## 2014-05-16 DIAGNOSIS — M25552 Pain in left hip: Secondary | ICD-10-CM | POA: Diagnosis not present

## 2014-05-16 DIAGNOSIS — M5032 Other cervical disc degeneration, mid-cervical region: Secondary | ICD-10-CM | POA: Diagnosis not present

## 2014-05-16 DIAGNOSIS — M9903 Segmental and somatic dysfunction of lumbar region: Secondary | ICD-10-CM | POA: Diagnosis not present

## 2014-05-18 ENCOUNTER — Other Ambulatory Visit: Payer: Self-pay | Admitting: Internal Medicine

## 2014-05-18 ENCOUNTER — Other Ambulatory Visit: Payer: Medicare Other

## 2014-05-18 ENCOUNTER — Encounter: Payer: Self-pay | Admitting: Internal Medicine

## 2014-05-18 ENCOUNTER — Other Ambulatory Visit (INDEPENDENT_AMBULATORY_CARE_PROVIDER_SITE_OTHER): Payer: Medicare Other

## 2014-05-18 ENCOUNTER — Ambulatory Visit (INDEPENDENT_AMBULATORY_CARE_PROVIDER_SITE_OTHER): Payer: Medicare Other | Admitting: Internal Medicine

## 2014-05-18 VITALS — BP 112/76 | HR 69 | Temp 97.7°F | Resp 14 | Ht 72.0 in | Wt 217.0 lb

## 2014-05-18 DIAGNOSIS — Z8042 Family history of malignant neoplasm of prostate: Secondary | ICD-10-CM

## 2014-05-18 DIAGNOSIS — I10 Essential (primary) hypertension: Secondary | ICD-10-CM

## 2014-05-18 DIAGNOSIS — Z86010 Personal history of colonic polyps: Secondary | ICD-10-CM

## 2014-05-18 DIAGNOSIS — E785 Hyperlipidemia, unspecified: Secondary | ICD-10-CM | POA: Diagnosis not present

## 2014-05-18 LAB — BASIC METABOLIC PANEL
BUN: 18 mg/dL (ref 6–23)
CO2: 28 mEq/L (ref 19–32)
Calcium: 9.2 mg/dL (ref 8.4–10.5)
Chloride: 101 mEq/L (ref 96–112)
Creatinine, Ser: 1 mg/dL (ref 0.4–1.5)
GFR: 83.49 mL/min (ref >60.00)
Glucose, Bld: 126 mg/dL — ABNORMAL HIGH (ref 70–99)
Potassium: 5.1 mEq/L (ref 3.5–5.1)
Sodium: 136 mEq/L (ref 135–145)

## 2014-05-18 LAB — CBC WITH DIFFERENTIAL/PLATELET
Basophils Absolute: 0 10*3/uL (ref 0.0–0.1)
Basophils Relative: 0.2 % (ref 0.0–3.0)
Eosinophils Absolute: 0.2 10*3/uL (ref 0.0–0.7)
Eosinophils Relative: 2.9 % (ref 0.0–5.0)
HCT: 41.3 % (ref 39.0–52.0)
Hemoglobin: 13.9 g/dL (ref 13.0–17.0)
Lymphocytes Relative: 43.2 % (ref 12.0–46.0)
Lymphs Abs: 3.6 10*3/uL (ref 0.7–4.0)
MCHC: 33.7 g/dL (ref 30.0–36.0)
MCV: 96.1 fl (ref 78.0–100.0)
Monocytes Absolute: 0.8 10*3/uL (ref 0.1–1.0)
Monocytes Relative: 9.1 % (ref 3.0–12.0)
Neutro Abs: 3.7 10*3/uL (ref 1.4–7.7)
Neutrophils Relative %: 44.6 % (ref 43.0–77.0)
Platelets: 187 10*3/uL (ref 150.0–400.0)
RBC: 4.29 Mil/uL (ref 4.22–5.81)
RDW: 13.4 % (ref 11.5–15.5)
WBC: 8.3 10*3/uL (ref 4.0–10.5)

## 2014-05-18 LAB — HEPATIC FUNCTION PANEL
ALT: 18 U/L (ref 0–53)
AST: 19 U/L (ref 0–37)
Albumin: 3.7 g/dL (ref 3.5–5.2)
Alkaline Phosphatase: 61 U/L (ref 39–117)
Bilirubin, Direct: 0.1 mg/dL (ref 0.0–0.3)
Total Bilirubin: 0.8 mg/dL (ref 0.2–1.2)
Total Protein: 6.7 g/dL (ref 6.0–8.3)

## 2014-05-18 LAB — TSH: TSH: 1.71 u[IU]/mL (ref 0.35–4.50)

## 2014-05-18 LAB — PSA: PSA: 0.57 ng/mL (ref 0.10–4.00)

## 2014-05-18 NOTE — Assessment & Plan Note (Signed)
Blood pressure goals reviewed. BMET 

## 2014-05-18 NOTE — Progress Notes (Signed)
Pre visit review using our clinic review tool, if applicable. No additional management support is needed unless otherwise documented below in the visit note. 

## 2014-05-18 NOTE — Assessment & Plan Note (Signed)
CBC

## 2014-05-18 NOTE — Progress Notes (Signed)
Subjective:    Patient ID: Roger Stewart, male    DOB: [birth year], 68 y.o.   MRN: [id number]  HPI  He is here to assess active health issues & conditions. PMH, FH, & Social history verified & updated   He is on a heart healthy diet and restricts sodium.  He exercises 60 minutes 3 times a week without cardiopulmonary symptoms  His blood pressure averages 135/85.  He is compliant with his medicines without adverse effects.  And advanced cholesterol panel in 2005 revealed his LDL goal to be less than 100. There is no family history of premature heart attack or stroke. His father had TIAs in his 46s.  His colonoscopy is  scheduled for next week; he had colon polyps in 2010. He has no active GI symptoms.    Review of Systems   Chest pain, palpitations, tachycardia, exertional dyspnea, paroxysmal nocturnal dyspnea, claudication or edema are absent.  Unexplained weight loss, abdominal pain, significant dyspepsia, dysphagia, melena, rectal bleeding, or persistently small caliber stools are denied.     Objective:   Physical Exam  Gen.: Healthy and well-nourished in appearance. Alert, appropriate and cooperative throughout exam. Appears younger than stated age  Head: Normocephalic without obvious abnormalities;minor crown alopecia  Eyes: No corneal or conjunctival inflammation noted. Pupils equal round reactive to light and accommodation. Extraocular motion intact.  Ears: External  ear exam reveals no significant lesions or deformities. Wax bilaterally. Hearing is grossly normal bilaterally. Nose: External nasal exam reveals no deformity or inflammation. Nasal mucosa are pink and moist. No lesions or exudates noted.   Mouth: Oral mucosa and oropharynx reveal no lesions or exudates. Teeth in good repair. Neck: No deformities, masses, or tenderness noted. Range of motion &. Thyroid normal Lungs:Decreased BS. Normal respiratory effort; chest expands symmetrically. Lungs are clear to  auscultation without rales, wheezes, or increased work of breathing. Heart: Distant heart sounds.Normal rate and rhythm. Normal S1 and S2. No gallop, click, or rub. No murmur. Abdomen: Bowel sounds normal; abdomen soft and nontender. No masses, organomegaly or hernias noted. Genitalia: Genitalia normal except for left varices. Prostate is normal without enlargement, asymmetry, nodularity, or induration                               Musculoskeletal/extremities: No deformity or scoliosis noted of  the thoracic or lumbar spine. No clubbing, cyanosis, edema, or significant extremity  deformity noted.  Range of motion normal . Tone & strength normal. Hand joints normal.  Fingernail health good. Crepitus of knees , L >R Able to lie down & sit up w/o help.  Negative SLR bilaterally Vascular: Carotid, radial artery, dorsalis pedis and  posterior tibial pulses are full and equal. No bruits present. Neurologic: Alert and oriented x3. Deep tendon reflexes symmetrical and normal.  Gait normal.     Skin: Intact without suspicious lesions or rashes. Lymph: No cervical, axillary, or inguinal lymphadenopathy present. Psych: Mood and affect are normal. Normally interactive  Assessment & Plan:  See Current Assessment & Plan in Problem List under specific Diagnosis

## 2014-05-18 NOTE — Patient Instructions (Signed)
Your next office appointment will be determined based upon review of your pending labs. Those instructions will be transmitted to you through My Chart . 

## 2014-05-18 NOTE — Assessment & Plan Note (Signed)
NMR Lipids, LFTs, TSH  

## 2014-05-19 ENCOUNTER — Other Ambulatory Visit (INDEPENDENT_AMBULATORY_CARE_PROVIDER_SITE_OTHER): Payer: Medicare Other

## 2014-05-19 ENCOUNTER — Telehealth: Payer: Self-pay

## 2014-05-19 DIAGNOSIS — M9901 Segmental and somatic dysfunction of cervical region: Secondary | ICD-10-CM | POA: Diagnosis not present

## 2014-05-19 DIAGNOSIS — M9905 Segmental and somatic dysfunction of pelvic region: Secondary | ICD-10-CM | POA: Diagnosis not present

## 2014-05-19 DIAGNOSIS — M9903 Segmental and somatic dysfunction of lumbar region: Secondary | ICD-10-CM | POA: Diagnosis not present

## 2014-05-19 DIAGNOSIS — M25552 Pain in left hip: Secondary | ICD-10-CM | POA: Diagnosis not present

## 2014-05-19 DIAGNOSIS — M5136 Other intervertebral disc degeneration, lumbar region: Secondary | ICD-10-CM | POA: Diagnosis not present

## 2014-05-19 DIAGNOSIS — R739 Hyperglycemia, unspecified: Secondary | ICD-10-CM | POA: Diagnosis not present

## 2014-05-19 DIAGNOSIS — M5032 Other cervical disc degeneration, mid-cervical region: Secondary | ICD-10-CM | POA: Diagnosis not present

## 2014-05-19 DIAGNOSIS — M9904 Segmental and somatic dysfunction of sacral region: Secondary | ICD-10-CM | POA: Diagnosis not present

## 2014-05-19 DIAGNOSIS — M5414 Radiculopathy, thoracic region: Secondary | ICD-10-CM | POA: Diagnosis not present

## 2014-05-19 LAB — HEMOGLOBIN A1C: Hgb A1c MFr Bld: 6 % (ref 4.6–6.5)

## 2014-05-19 NOTE — Telephone Encounter (Signed)
Request for add on has been sent to lab

## 2014-05-19 NOTE — Telephone Encounter (Signed)
-----   Message from Hendricks Limes, MD sent at 05/19/2014 12:45 PM EST ----- Please add A1c (R73.9)

## 2014-05-20 ENCOUNTER — Telehealth: Payer: Self-pay | Admitting: Internal Medicine

## 2014-05-20 LAB — NMR LIPOPROFILE WITH LIPIDS
Cholesterol, Total: 251 mg/dL — ABNORMAL HIGH (ref 100–199)
HDL Particle Number: 32.4 umol/L (ref >=30.5)
HDL Size: 8.9 nm — ABNORMAL LOW (ref >=9.2)
HDL-C: 63 mg/dL (ref >39)
LDL (calc): 151 mg/dL — ABNORMAL HIGH (ref 0–99)
LDL Particle Number: 1995 nmol/L — ABNORMAL HIGH (ref <1000)
LDL Size: 21.3 nm (ref >=20.8)
LP-IR Score: 56 — ABNORMAL HIGH (ref <=45)
Large HDL-P: 6.6 umol/L (ref >=4.8)
Large VLDL-P: 9.1 nmol/L — ABNORMAL HIGH (ref <=2.7)
Small LDL Particle Number: 699 nmol/L — ABNORMAL HIGH (ref <=527)
Triglycerides: 184 mg/dL — ABNORMAL HIGH (ref 0–149)
VLDL Size: 50.8 nm — ABNORMAL HIGH (ref <=46.6)

## 2014-05-23 DIAGNOSIS — H40001 Preglaucoma, unspecified, right eye: Secondary | ICD-10-CM | POA: Diagnosis not present

## 2014-05-23 DIAGNOSIS — Z86010 Personal history of colonic polyps: Secondary | ICD-10-CM | POA: Diagnosis not present

## 2014-05-23 DIAGNOSIS — H2513 Age-related nuclear cataract, bilateral: Secondary | ICD-10-CM | POA: Diagnosis not present

## 2014-05-23 DIAGNOSIS — K5730 Diverticulosis of large intestine without perforation or abscess without bleeding: Secondary | ICD-10-CM | POA: Diagnosis not present

## 2014-05-24 ENCOUNTER — Encounter: Payer: Self-pay | Admitting: Internal Medicine

## 2014-05-25 ENCOUNTER — Encounter: Payer: Medicare Other | Admitting: Internal Medicine

## 2014-07-05 DIAGNOSIS — M62830 Muscle spasm of back: Secondary | ICD-10-CM | POA: Diagnosis not present

## 2014-07-05 DIAGNOSIS — M542 Cervicalgia: Secondary | ICD-10-CM | POA: Diagnosis not present

## 2014-07-05 DIAGNOSIS — M47812 Spondylosis without myelopathy or radiculopathy, cervical region: Secondary | ICD-10-CM | POA: Diagnosis not present

## 2014-07-06 DIAGNOSIS — M62830 Muscle spasm of back: Secondary | ICD-10-CM | POA: Diagnosis not present

## 2014-07-06 DIAGNOSIS — M47812 Spondylosis without myelopathy or radiculopathy, cervical region: Secondary | ICD-10-CM | POA: Diagnosis not present

## 2014-07-06 DIAGNOSIS — M542 Cervicalgia: Secondary | ICD-10-CM | POA: Diagnosis not present

## 2014-07-07 DIAGNOSIS — M47812 Spondylosis without myelopathy or radiculopathy, cervical region: Secondary | ICD-10-CM | POA: Diagnosis not present

## 2014-07-07 DIAGNOSIS — M62830 Muscle spasm of back: Secondary | ICD-10-CM | POA: Diagnosis not present

## 2014-07-07 DIAGNOSIS — M542 Cervicalgia: Secondary | ICD-10-CM | POA: Diagnosis not present

## 2014-07-08 DIAGNOSIS — M542 Cervicalgia: Secondary | ICD-10-CM | POA: Diagnosis not present

## 2014-07-08 DIAGNOSIS — M62830 Muscle spasm of back: Secondary | ICD-10-CM | POA: Diagnosis not present

## 2014-07-08 DIAGNOSIS — M47812 Spondylosis without myelopathy or radiculopathy, cervical region: Secondary | ICD-10-CM | POA: Diagnosis not present

## 2014-07-12 DIAGNOSIS — Z86010 Personal history of colonic polyps: Secondary | ICD-10-CM | POA: Diagnosis not present

## 2014-07-14 DIAGNOSIS — M47812 Spondylosis without myelopathy or radiculopathy, cervical region: Secondary | ICD-10-CM | POA: Diagnosis not present

## 2014-07-14 DIAGNOSIS — M542 Cervicalgia: Secondary | ICD-10-CM | POA: Diagnosis not present

## 2014-07-14 DIAGNOSIS — M62830 Muscle spasm of back: Secondary | ICD-10-CM | POA: Diagnosis not present

## 2014-08-02 DIAGNOSIS — M62830 Muscle spasm of back: Secondary | ICD-10-CM | POA: Diagnosis not present

## 2014-08-02 DIAGNOSIS — M542 Cervicalgia: Secondary | ICD-10-CM | POA: Diagnosis not present

## 2014-08-02 DIAGNOSIS — M47812 Spondylosis without myelopathy or radiculopathy, cervical region: Secondary | ICD-10-CM | POA: Diagnosis not present

## 2014-08-26 ENCOUNTER — Telehealth: Payer: Self-pay

## 2014-08-26 NOTE — Telephone Encounter (Signed)
Not able to leave message about flu vaccine, voice mailbox full

## 2014-10-06 NOTE — Telephone Encounter (Signed)
error:[id number] ° °

## 2014-11-14 MED ORDER — LISINOPRIL 20 MG PO TABS: 20.0000 mg | ORAL_TABLET | Freq: Every day | ORAL | Status: DC

## 2014-11-29 DIAGNOSIS — H40013 Open angle with borderline findings, low risk, bilateral: Secondary | ICD-10-CM | POA: Diagnosis not present

## 2014-12-05 ENCOUNTER — Other Ambulatory Visit: Payer: Self-pay

## 2015-01-12 DIAGNOSIS — H6063 Unspecified chronic otitis externa, bilateral: Secondary | ICD-10-CM | POA: Diagnosis not present

## 2015-01-12 DIAGNOSIS — H6122 Impacted cerumen, left ear: Secondary | ICD-10-CM | POA: Diagnosis not present

## 2015-01-12 DIAGNOSIS — C44311 Basal cell carcinoma of skin of nose: Secondary | ICD-10-CM | POA: Diagnosis not present

## 2015-01-16 DIAGNOSIS — H6122 Impacted cerumen, left ear: Secondary | ICD-10-CM | POA: Diagnosis not present

## 2015-01-16 DIAGNOSIS — H6063 Unspecified chronic otitis externa, bilateral: Secondary | ICD-10-CM | POA: Diagnosis not present

## 2015-02-14 DIAGNOSIS — K5730 Diverticulosis of large intestine without perforation or abscess without bleeding: Secondary | ICD-10-CM | POA: Diagnosis not present

## 2015-02-14 DIAGNOSIS — Z86010 Personal history of colonic polyps: Secondary | ICD-10-CM | POA: Diagnosis not present

## 2015-02-14 DIAGNOSIS — Z09 Encounter for follow-up examination after completed treatment for conditions other than malignant neoplasm: Secondary | ICD-10-CM | POA: Diagnosis not present

## 2015-02-14 DIAGNOSIS — K921 Melena: Secondary | ICD-10-CM | POA: Diagnosis not present

## 2015-02-22 ENCOUNTER — Other Ambulatory Visit: Payer: Self-pay | Admitting: Internal Medicine

## 2015-05-22 ENCOUNTER — Encounter: Payer: Self-pay | Admitting: Internal Medicine

## 2015-05-22 ENCOUNTER — Other Ambulatory Visit (INDEPENDENT_AMBULATORY_CARE_PROVIDER_SITE_OTHER): Payer: Self-pay

## 2015-05-22 ENCOUNTER — Ambulatory Visit (INDEPENDENT_AMBULATORY_CARE_PROVIDER_SITE_OTHER): Payer: Medicare Other | Admitting: Internal Medicine

## 2015-05-22 VITALS — BP 118/88 | HR 65 | Temp 98.8°F | Resp 20 | Ht 73.0 in | Wt 210.0 lb

## 2015-05-22 DIAGNOSIS — I10 Essential (primary) hypertension: Secondary | ICD-10-CM

## 2015-05-22 DIAGNOSIS — Z860100 Personal history of colon polyps, unspecified: Secondary | ICD-10-CM

## 2015-05-22 DIAGNOSIS — Z8042 Family history of malignant neoplasm of prostate: Secondary | ICD-10-CM

## 2015-05-22 DIAGNOSIS — R739 Hyperglycemia, unspecified: Secondary | ICD-10-CM

## 2015-05-22 DIAGNOSIS — E785 Hyperlipidemia, unspecified: Secondary | ICD-10-CM

## 2015-05-22 LAB — HEMOGLOBIN A1C: Hgb A1c MFr Bld: 5.6 % (ref 4.6–6.5)

## 2015-05-22 LAB — CBC WITH DIFFERENTIAL/PLATELET
Basophils Absolute: 0 10*3/uL (ref 0.0–0.1)
Basophils Relative: 0.5 % (ref 0.0–3.0)
Eosinophils Absolute: 0.3 10*3/uL (ref 0.0–0.7)
Eosinophils Relative: 3.9 % (ref 0.0–5.0)
HCT: 42.5 % (ref 39.0–52.0)
Hemoglobin: 14.1 g/dL (ref 13.0–17.0)
Lymphocytes Relative: 41.6 % (ref 12.0–46.0)
Lymphs Abs: 3.6 10*3/uL (ref 0.7–4.0)
MCHC: 33.2 g/dL (ref 30.0–36.0)
MCV: 96.8 fl (ref 78.0–100.0)
Monocytes Absolute: 1 10*3/uL (ref 0.1–1.0)
Monocytes Relative: 11.9 % (ref 3.0–12.0)
Neutro Abs: 3.6 10*3/uL (ref 1.4–7.7)
Neutrophils Relative %: 42.1 % — ABNORMAL LOW (ref 43.0–77.0)
Platelets: 226 10*3/uL (ref 150.0–400.0)
RBC: 4.39 Mil/uL (ref 4.22–5.81)
RDW: 13.7 % (ref 11.5–15.5)
WBC: 8.6 10*3/uL (ref 4.0–10.5)

## 2015-05-22 LAB — HEPATIC FUNCTION PANEL
ALT: 14 U/L (ref 0–53)
AST: 18 U/L (ref 0–37)
Albumin: 3.9 g/dL (ref 3.5–5.2)
Alkaline Phosphatase: 66 U/L (ref 39–117)
Bilirubin, Direct: 0.1 mg/dL (ref 0.0–0.3)
Total Bilirubin: 0.5 mg/dL (ref 0.2–1.2)
Total Protein: 6.9 g/dL (ref 6.0–8.3)

## 2015-05-22 LAB — BASIC METABOLIC PANEL
BUN: 24 mg/dL — ABNORMAL HIGH (ref 6–23)
CO2: 31 mEq/L (ref 19–32)
Calcium: 9.7 mg/dL (ref 8.4–10.5)
Chloride: 101 mEq/L (ref 96–112)
Creatinine, Ser: 0.91 mg/dL (ref 0.40–1.50)
GFR: 87.49 mL/min (ref >60.00)
Glucose, Bld: 126 mg/dL — ABNORMAL HIGH (ref 70–99)
Potassium: 4.8 mEq/L (ref 3.5–5.1)
Sodium: 138 mEq/L (ref 135–145)

## 2015-05-22 LAB — LIPID PANEL
Cholesterol: 233 mg/dL — ABNORMAL HIGH (ref 0–200)
HDL: 70.4 mg/dL (ref >39.00)
LDL Cholesterol: 144 mg/dL — ABNORMAL HIGH (ref 0–99)
NonHDL: 162.36
Total CHOL/HDL Ratio: 3
Triglycerides: 94 mg/dL (ref 0.0–149.0)
VLDL: 18.8 mg/dL (ref 0.0–40.0)

## 2015-05-22 LAB — TSH: TSH: 2.97 u[IU]/mL (ref 0.35–4.50)

## 2015-05-22 NOTE — Assessment & Plan Note (Addendum)
"  Skip it (PSA) this year"

## 2015-05-22 NOTE — Assessment & Plan Note (Signed)
New R pre auricular lesion will be addressed in [place]

## 2015-05-22 NOTE — Patient Instructions (Signed)
Minimal Blood Pressure Goal= AVERAGE < 140/90;  Ideal is an AVERAGE < 135/85. This AVERAGE should be calculated from @ least 5-7 BP readings taken @ different times of day on different days of week. You should not respond to isolated BP readings , but rather the AVERAGE for that week .Please bring your  blood pressure cuff to office visits to verify that it is reliable.It  can also be checked against the blood pressure device at the pharmacy. Finger or wrist cuffs are not dependable; an arm cuff is.Plain Mucinex (NOT D) for thick secretions ;force NON dairy fluids .     Nasal cleansing in the shower as discussed with lather of mild shampoo.After 10 seconds wash off lather while  exhaling through nostrils. Make sure that all residual soap is removed to prevent irritation.  Flonase OR Nasacort AQ 1 spray in each nostril twice a day as needed. Use the "crossover" technique into opposite nostril spraying toward opposite ear @ 45 degree angle, not straight up into nostril.  Plain Allegra (NOT D )  160 daily , Loratidine 10 mg , OR Zyrtec 10 mg @ bedtime  as needed for itchy eyes & sneezing.  Your next office appointment will be determined based upon review of your pending labs .Those written interpretation of the lab results and instructions will be transmitted to you by My Chart.Critical results will be called.   Followup as needed for any active or acute issue. Please report any significant change in your symptoms.

## 2015-05-22 NOTE — Assessment & Plan Note (Signed)
Lipids, LFTs, TSH  

## 2015-05-22 NOTE — Assessment & Plan Note (Signed)
Blood pressure goals reviewed. BMET 

## 2015-05-22 NOTE — Progress Notes (Signed)
Pre visit review using our clinic review tool, if applicable. No additional management support is needed unless otherwise documented below in the visit note. 

## 2015-05-22 NOTE — Assessment & Plan Note (Signed)
CBC

## 2015-05-22 NOTE — Progress Notes (Signed)
   Subjective:    Patient ID: Roger Stewart, male    DOB: 01-19-1946, 69 y.o.   MRN: [id number]  HPI The patient is here to assess status of active health conditions.  PMH, FH, & Social History reviewed & updated. His brother has been diagnosed with leukemia.  He is on a heart healthy diet. He exercises 3-4 days per week for 30-40 minutes without associated cardiopulmonary symptoms. Blood pressure averages 115/75-80 on only one metoprolol, not bid. He does smoke 1 cigar a day. He will have 2-3 alcoholic beverages a day.  Colonoscopy was done in September of this year. Apparently he had a polyp prior to that but none in Sept. There is no surgical pathology in the chart ; he sees Dr. Amedeo Plenty. He was told to return in 5 years.  He does have rhinitis during the months of October- November. This has resolved.  He has intermittent left lower extremity sciatica since he had  neurosurgery in [place].  He also has nocturia once nightly.  Otherwise review of systems is negative.  Review of Systems  Chest pain, palpitations, tachycardia, exertional dyspnea, paroxysmal nocturnal dyspnea, claudication or edema are absent. No unexplained weight loss, abdominal pain, significant dyspepsia, dysphagia, melena, rectal bleeding, or persistently small caliber stools. Dysuria, pyuria, hematuria, frequency, or polyuria are denied. Change in hair, skin, nails denied. No bowel changes of constipation or diarrhea. No intolerance to heat or cold.     Objective:   Physical Exam Pertinent or positive findings include: There is an irregular keratotic lesion anterior to the right ear which is suggestive of malignancy. He believes it's a basal cell and plans to have it treated in [place]. Heart and breath sounds are distant. He has a well-healed mid abdominal operative scar. Pedal pulses are slightly decreased. Testicular atrophy is present, greater on the right than the left. Prostate is normal without enlargement,  induration, asymmetry, or nodularity.  General appearance :adequately nourished; in no distress.  Eyes: No conjunctival inflammation or scleral icterus is present.  Oral exam:  Lips and gums are healthy appearing.There is no oropharyngeal erythema or exudate noted. Dental hygiene is good.  Heart:  Normal rate and regular rhythm. S1 and S2 normal without gallop, murmur, click, rub or other extra sounds    Lungs:Chest clear to auscultation; no wheezes, rhonchi,rales ,or rubs present.No increased work of breathing.   Abdomen: bowel sounds normal, soft and non-tender without masses, organomegaly or hernias noted.  No guarding or rebound.  Vascular : all pulses equal ; no bruits present.  Skin:Warm & dry.  Intact without suspicious lesions or rashes ; no tenting or jaundice   Lymphatic: No lymphadenopathy is noted about the head, neck, axilla, or inguinal areas.   Neuro: Strength, tone & DTRs normal.     Assessment & Plan:  See Current Assessment & Plan in Problem List under specific Diagnosis

## 2015-05-25 DIAGNOSIS — H2513 Age-related nuclear cataract, bilateral: Secondary | ICD-10-CM | POA: Diagnosis not present

## 2015-05-25 DIAGNOSIS — H01024 Squamous blepharitis left upper eyelid: Secondary | ICD-10-CM | POA: Diagnosis not present

## 2015-05-25 DIAGNOSIS — H40013 Open angle with borderline findings, low risk, bilateral: Secondary | ICD-10-CM | POA: Diagnosis not present

## 2015-05-25 DIAGNOSIS — H01021 Squamous blepharitis right upper eyelid: Secondary | ICD-10-CM | POA: Diagnosis not present

## 2015-07-05 DIAGNOSIS — D0462 Carcinoma in situ of skin of left upper limb, including shoulder: Secondary | ICD-10-CM | POA: Diagnosis not present

## 2015-07-05 DIAGNOSIS — Z85828 Personal history of other malignant neoplasm of skin: Secondary | ICD-10-CM | POA: Diagnosis not present

## 2015-07-05 DIAGNOSIS — L578 Other skin changes due to chronic exposure to nonionizing radiation: Secondary | ICD-10-CM | POA: Diagnosis not present

## 2015-07-05 DIAGNOSIS — L821 Other seborrheic keratosis: Secondary | ICD-10-CM | POA: Diagnosis not present

## 2015-07-05 DIAGNOSIS — L111 Transient acantholytic dermatosis [Grover]: Secondary | ICD-10-CM | POA: Diagnosis not present

## 2015-07-05 DIAGNOSIS — D485 Neoplasm of uncertain behavior of skin: Secondary | ICD-10-CM | POA: Diagnosis not present

## 2015-07-05 DIAGNOSIS — Z08 Encounter for follow-up examination after completed treatment for malignant neoplasm: Secondary | ICD-10-CM | POA: Diagnosis not present

## 2015-07-05 DIAGNOSIS — R239 Unspecified skin changes: Secondary | ICD-10-CM | POA: Diagnosis not present

## 2015-07-05 DIAGNOSIS — X32XXXA Exposure to sunlight, initial encounter: Secondary | ICD-10-CM | POA: Diagnosis not present

## 2015-07-05 DIAGNOSIS — L570 Actinic keratosis: Secondary | ICD-10-CM | POA: Diagnosis not present

## 2015-08-16 ENCOUNTER — Other Ambulatory Visit: Payer: Self-pay | Admitting: Internal Medicine

## 2015-08-29 DIAGNOSIS — M47811 Spondylosis without myelopathy or radiculopathy, occipito-atlanto-axial region: Secondary | ICD-10-CM | POA: Diagnosis not present

## 2015-08-29 DIAGNOSIS — M542 Cervicalgia: Secondary | ICD-10-CM | POA: Diagnosis not present

## 2015-08-29 DIAGNOSIS — M62830 Muscle spasm of back: Secondary | ICD-10-CM | POA: Diagnosis not present

## 2015-08-29 DIAGNOSIS — M9901 Segmental and somatic dysfunction of cervical region: Secondary | ICD-10-CM | POA: Diagnosis not present

## 2015-08-30 DIAGNOSIS — M542 Cervicalgia: Secondary | ICD-10-CM | POA: Diagnosis not present

## 2015-08-30 DIAGNOSIS — M47811 Spondylosis without myelopathy or radiculopathy, occipito-atlanto-axial region: Secondary | ICD-10-CM | POA: Diagnosis not present

## 2015-08-30 DIAGNOSIS — M62830 Muscle spasm of back: Secondary | ICD-10-CM | POA: Diagnosis not present

## 2015-08-30 DIAGNOSIS — M9901 Segmental and somatic dysfunction of cervical region: Secondary | ICD-10-CM | POA: Diagnosis not present

## 2015-08-31 DIAGNOSIS — M62830 Muscle spasm of back: Secondary | ICD-10-CM | POA: Diagnosis not present

## 2015-08-31 DIAGNOSIS — M47811 Spondylosis without myelopathy or radiculopathy, occipito-atlanto-axial region: Secondary | ICD-10-CM | POA: Diagnosis not present

## 2015-08-31 DIAGNOSIS — M542 Cervicalgia: Secondary | ICD-10-CM | POA: Diagnosis not present

## 2015-08-31 DIAGNOSIS — M9901 Segmental and somatic dysfunction of cervical region: Secondary | ICD-10-CM | POA: Diagnosis not present

## 2015-09-07 ENCOUNTER — Encounter (INDEPENDENT_AMBULATORY_CARE_PROVIDER_SITE_OTHER): Payer: Self-pay

## 2015-10-30 DIAGNOSIS — M9904 Segmental and somatic dysfunction of sacral region: Secondary | ICD-10-CM | POA: Diagnosis not present

## 2015-10-30 DIAGNOSIS — M9905 Segmental and somatic dysfunction of pelvic region: Secondary | ICD-10-CM | POA: Diagnosis not present

## 2015-10-30 DIAGNOSIS — M4726 Other spondylosis with radiculopathy, lumbar region: Secondary | ICD-10-CM | POA: Diagnosis not present

## 2015-10-30 DIAGNOSIS — M9903 Segmental and somatic dysfunction of lumbar region: Secondary | ICD-10-CM | POA: Diagnosis not present

## 2015-10-30 DIAGNOSIS — M545 Low back pain: Secondary | ICD-10-CM | POA: Diagnosis not present

## 2015-10-31 DIAGNOSIS — M4726 Other spondylosis with radiculopathy, lumbar region: Secondary | ICD-10-CM | POA: Diagnosis not present

## 2015-10-31 DIAGNOSIS — M9904 Segmental and somatic dysfunction of sacral region: Secondary | ICD-10-CM | POA: Diagnosis not present

## 2015-10-31 DIAGNOSIS — M9905 Segmental and somatic dysfunction of pelvic region: Secondary | ICD-10-CM | POA: Diagnosis not present

## 2015-10-31 DIAGNOSIS — M9903 Segmental and somatic dysfunction of lumbar region: Secondary | ICD-10-CM | POA: Diagnosis not present

## 2015-10-31 DIAGNOSIS — M545 Low back pain: Secondary | ICD-10-CM | POA: Diagnosis not present

## 2015-11-01 DIAGNOSIS — M9904 Segmental and somatic dysfunction of sacral region: Secondary | ICD-10-CM | POA: Diagnosis not present

## 2015-11-01 DIAGNOSIS — M9905 Segmental and somatic dysfunction of pelvic region: Secondary | ICD-10-CM | POA: Diagnosis not present

## 2015-11-01 DIAGNOSIS — M9903 Segmental and somatic dysfunction of lumbar region: Secondary | ICD-10-CM | POA: Diagnosis not present

## 2015-11-01 DIAGNOSIS — M4726 Other spondylosis with radiculopathy, lumbar region: Secondary | ICD-10-CM | POA: Diagnosis not present

## 2015-11-01 DIAGNOSIS — M545 Low back pain: Secondary | ICD-10-CM | POA: Diagnosis not present

## 2015-11-13 ENCOUNTER — Ambulatory Visit: Payer: PRIVATE HEALTH INSURANCE | Admitting: Family

## 2015-11-13 DIAGNOSIS — Z0289 Encounter for other administrative examinations: Secondary | ICD-10-CM

## 2015-11-14 ENCOUNTER — Ambulatory Visit: Payer: Medicare Other | Admitting: Family

## 2015-11-14 DIAGNOSIS — Z0289 Encounter for other administrative examinations: Secondary | ICD-10-CM

## 2015-11-16 ENCOUNTER — Other Ambulatory Visit: Payer: Self-pay | Admitting: Family

## 2015-11-16 ENCOUNTER — Telehealth: Payer: Self-pay | Admitting: Family

## 2015-11-16 ENCOUNTER — Ambulatory Visit (INDEPENDENT_AMBULATORY_CARE_PROVIDER_SITE_OTHER): Payer: Medicare Other | Admitting: Family

## 2015-11-16 ENCOUNTER — Encounter: Payer: Self-pay | Admitting: Family

## 2015-11-16 VITALS — BP 132/86 | HR 63 | Temp 98.4°F | Resp 16 | Ht 73.0 in | Wt 207.0 lb

## 2015-11-16 DIAGNOSIS — I10 Essential (primary) hypertension: Secondary | ICD-10-CM

## 2015-11-16 DIAGNOSIS — M5416 Radiculopathy, lumbar region: Secondary | ICD-10-CM

## 2015-11-16 MED ORDER — DICLOFENAC SODIUM 25 MG PO TBEC: 25.0000 mg | DELAYED_RELEASE_TABLET | Freq: Two times a day (BID) | ORAL | Status: DC | PRN

## 2015-11-16 MED ORDER — LISINOPRIL 20 MG PO TABS: 20.0000 mg | ORAL_TABLET | Freq: Every day | ORAL | Status: DC

## 2015-11-16 MED ORDER — TRAMADOL HCL 50 MG PO TABS: 50.0000 mg | ORAL_TABLET | Freq: Three times a day (TID) | ORAL | Status: DC | PRN

## 2015-11-16 MED ORDER — DICLOFENAC SODIUM 1 % TD GEL: 2.0000 g | Freq: Four times a day (QID) | TRANSDERMAL | Status: DC

## 2015-11-16 MED ORDER — METOPROLOL TARTRATE 25 MG PO TABS: 25.0000 mg | ORAL_TABLET | Freq: Every day | ORAL | Status: DC

## 2015-11-16 NOTE — Progress Notes (Signed)
Subjective:    Patient ID: Roger Stewart, male    DOB: August 26, 1945, 70 y.o.   MRN: [id number]  Chief Complaint  Patient presents with  . Establish Care    metoprolol and diclofenac refill, wants to talk about getting on tramadol for back pain    HPI:  Roger Stewart is a 70 y.o. male who  has a past medical history of Cancer ([place]); Hypertension; Hyperlipidemia; Diverticulosis of colon; Colon polyps; and Sciatica. and presents today for an office visit.   1.) Hypertension - Currently maintained on lisinopril and metoprolol. Reports taking medication as prescribed and denies adverse side effects or hypotensive readings. Denies symptoms of end organ damage.  BP Readings from Last 3 Encounters:  11/16/15 132/86  05/22/15 118/88  05/18/14 112/76    2.) Low back pain - Continues to experience the associated symptom of pain located in his lumbar spine from which he has had previous surgery. The pain generally waxes and wanes. Modifying factors include Voltaren gel which does help with the pain. Also notes that he tried a friend's Tramadol for his back and states it "worked like a Nutritional therapist."  States he is doing home exercise therapy and stretching daily to every other day. Pain is described as sharp at times with a severity that is generally mild.    No Known Allergies   No current outpatient prescriptions on file prior to visit.   No current facility-administered medications on file prior to visit.     Past Surgical History  Procedure Laterality Date  . Esi x3      LS spine  . Abdominal aortic aneurysm repair  2005  . Lumbar spine surgery  04/2010    spur & bulging disc L 4-5, [place] , [place]  . Lumbar spine surgery  08/2010    cyst resected @ op site  . Colonoscopy with polypectomy  2010;2016    Dr Amedeo Plenty     Review of Systems  Constitutional: Negative for fever and chills.  Eyes:       Negative for changes in vision  Respiratory: Negative for cough, chest tightness and  wheezing.   Cardiovascular: Negative for chest pain, palpitations and leg swelling.  Musculoskeletal: Positive for back pain.  Neurological: Negative for dizziness, weakness and light-headedness.      Objective:    BP 132/86 mmHg  Pulse 63  Temp(Src) 98.4 F (36.9 C) (Oral)  Resp 16  Ht 6\' 1"  (1.854 m)  Wt 207 lb (93.895 kg)  BMI 27.32 kg/m2  SpO2 94% Nursing note and vital signs reviewed.  Physical Exam  Constitutional: He is oriented to person, place, and time. He appears well-developed and well-nourished. No distress.  Cardiovascular: Normal rate, regular rhythm, normal heart sounds and intact distal pulses.   Pulmonary/Chest: Effort normal and breath sounds normal.  Musculoskeletal:  Low back - no obvious deformity, discoloration, or edema noted. Mild tenderness located on lumbar paraspinal musculature. Range of motion is intact and appropriate. Distal pulses and sensation are intact and appropriate. Straight leg raise is negative. Faber's test is negative.  Neurological: He is alert and oriented to person, place, and time.  Skin: Skin is warm and dry.  Psychiatric: He has a normal mood and affect. His behavior is normal. Judgment and thought content normal.       Assessment & Plan:   Problem List Items Addressed This Visit      Cardiovascular and Mediastinum   Essential hypertension - Primary  Hypertension is adequately controlled and below goal 140/90 with current regimen. No adverse side effects or symptoms of end organ damage. Denies worse headache of life. Continue current dosage of metoprolol and lisinopril. Encouraged to monitor blood pressure at home. Follow-up in 6 months or sooner if needed.      Relevant Medications   metoprolol tartrate (LOPRESSOR) 25 MG tablet   lisinopril (PRINIVIL,ZESTRIL) 20 MG tablet     Nervous and Auditory   Lumbar radiculopathy    Continues to experience lumbar radiculopathy mostly associated with playing golf. Continue home  exercise therapy with ice and heat. Continue Voltaren as needed for discomfort. Start tramadol as needed. Follow-up if symptoms worsen or are no longer controlled.      Relevant Medications   diclofenac sodium (VOLTAREN) 1 % GEL   diclofenac (VOLTAREN) 25 MG EC tablet   traMADol (ULTRAM) 50 MG tablet       I have discontinued Mr. Bamba Diclofenac Sodium (VOLTAREN EX) and metoprolol tartrate. I have also changed his metoprolol tartrate, lisinopril, and diclofenac. Additionally, I am having him start on diclofenac sodium and traMADol.   Meds ordered this encounter  Medications  . diclofenac sodium (VOLTAREN) 1 % GEL    Sig: Apply 2 g topically 4 (four) times daily.    Dispense:  100 g    Refill:  1    Order Specific Question:  Supervising Provider    Answer:  Pricilla Holm A [id number]  . metoprolol tartrate (LOPRESSOR) 25 MG tablet    Sig: Take 1 tablet (25 mg total) by mouth daily.    Dispense:  180 tablet    Refill:  1    Do not fill until patient requests refill.    Order Specific Question:  Supervising Provider    Answer:  Pricilla Holm A [id number]  . lisinopril (PRINIVIL,ZESTRIL) 20 MG tablet    Sig: Take 1 tablet (20 mg total) by mouth daily.    Dispense:  90 tablet    Refill:  0    Do not fill until ready for refill.    Order Specific Question:  Supervising Provider    Answer:  Pricilla Holm A [id number]  . diclofenac (VOLTAREN) 25 MG EC tablet    Sig: Take 1 tablet (25 mg total) by mouth 2 (two) times daily as needed.    Dispense:  60 tablet    Refill:  0    Order Specific Question:  Supervising Provider    Answer:  Pricilla Holm A [id number]  . traMADol (ULTRAM) 50 MG tablet    Sig: Take 1 tablet (50 mg total) by mouth every 8 (eight) hours as needed.    Dispense:  30 tablet    Refill:  0    Order Specific Question:  Supervising Provider    Answer:  Pricilla Holm A [id number]     Follow-up: Return in about 6 months (around 05/17/2016), or if  symptoms worsen or fail to improve.  Mauricio Po, FNP

## 2015-11-16 NOTE — Patient Instructions (Addendum)
Thank you for choosing [facility].  Summary/Instructions:  Please continue to take your medications as prescribed.  Start the tramadol as needed when the [facility] does not work.  Your prescription(s) have been submitted to your pharmacy or been printed and provided for you. Please take as directed and contact our office if you believe you are having problem(s) with the medication(s) or have any questions.  If your symptoms worsen or fail to improve, please contact our office for further instruction, or in case of emergency go directly to the emergency room at the closest medical facility.

## 2015-11-16 NOTE — Progress Notes (Signed)
Pre visit review using our clinic review tool, if applicable. No additional management support is needed unless otherwise documented below in the visit note. 

## 2015-11-16 NOTE — Assessment & Plan Note (Signed)
Hypertension is adequately controlled and below goal 140/90 with current regimen. No adverse side effects or symptoms of end organ damage. Denies worse headache of life. Continue current dosage of metoprolol and lisinopril. Encouraged to monitor blood pressure at home. Follow-up in 6 months or sooner if needed.

## 2015-11-16 NOTE — Telephone Encounter (Signed)
Needs clarification on metoprolol.

## 2015-11-16 NOTE — Assessment & Plan Note (Signed)
Continues to experience lumbar radiculopathy mostly associated with playing golf. Continue home exercise therapy with ice and heat. Continue Voltaren as needed for discomfort. Start tramadol as needed. Follow-up if symptoms worsen or are no longer controlled.

## 2015-11-17 NOTE — Telephone Encounter (Signed)
Gave clarifications to pharmacy

## 2015-12-26 DIAGNOSIS — H40013 Open angle with borderline findings, low risk, bilateral: Secondary | ICD-10-CM | POA: Diagnosis not present

## 2016-01-16 DIAGNOSIS — H6122 Impacted cerumen, left ear: Secondary | ICD-10-CM | POA: Diagnosis not present

## 2016-03-12 ENCOUNTER — Other Ambulatory Visit: Payer: Self-pay | Admitting: Family

## 2016-03-12 DIAGNOSIS — I10 Essential (primary) hypertension: Secondary | ICD-10-CM

## 2016-03-28 DIAGNOSIS — D0462 Carcinoma in situ of skin of left upper limb, including shoulder: Secondary | ICD-10-CM | POA: Diagnosis not present

## 2016-05-23 ENCOUNTER — Other Ambulatory Visit (INDEPENDENT_AMBULATORY_CARE_PROVIDER_SITE_OTHER): Payer: Medicare Other

## 2016-05-23 ENCOUNTER — Encounter: Payer: Self-pay | Admitting: Family

## 2016-05-23 ENCOUNTER — Ambulatory Visit (INDEPENDENT_AMBULATORY_CARE_PROVIDER_SITE_OTHER): Payer: Medicare Other | Admitting: Family

## 2016-05-23 VITALS — BP 122/88 | HR 60 | Temp 97.8°F | Resp 16 | Ht 73.0 in | Wt 211.0 lb

## 2016-05-23 DIAGNOSIS — Z125 Encounter for screening for malignant neoplasm of prostate: Secondary | ICD-10-CM

## 2016-05-23 DIAGNOSIS — E782 Mixed hyperlipidemia: Secondary | ICD-10-CM | POA: Diagnosis not present

## 2016-05-23 DIAGNOSIS — R7309 Other abnormal glucose: Secondary | ICD-10-CM

## 2016-05-23 DIAGNOSIS — Z7289 Other problems related to lifestyle: Secondary | ICD-10-CM

## 2016-05-23 DIAGNOSIS — M5416 Radiculopathy, lumbar region: Secondary | ICD-10-CM

## 2016-05-23 DIAGNOSIS — I10 Essential (primary) hypertension: Secondary | ICD-10-CM | POA: Diagnosis not present

## 2016-05-23 DIAGNOSIS — Z0000 Encounter for general adult medical examination without abnormal findings: Secondary | ICD-10-CM

## 2016-05-23 DIAGNOSIS — Z13220 Encounter for screening for lipoid disorders: Secondary | ICD-10-CM | POA: Insufficient documentation

## 2016-05-23 LAB — COMPREHENSIVE METABOLIC PANEL
ALT: 13 U/L (ref 0–53)
AST: 14 U/L (ref 0–37)
Albumin: 4.1 g/dL (ref 3.5–5.2)
Alkaline Phosphatase: 72 U/L (ref 39–117)
BUN: 20 mg/dL (ref 6–23)
CO2: 30 mEq/L (ref 19–32)
Calcium: 9.4 mg/dL (ref 8.4–10.5)
Chloride: 102 mEq/L (ref 96–112)
Creatinine, Ser: 0.82 mg/dL (ref 0.40–1.50)
GFR: 98.37 mL/min (ref >60.00)
Glucose, Bld: 126 mg/dL — ABNORMAL HIGH (ref 70–99)
Potassium: 4.9 mEq/L (ref 3.5–5.1)
Sodium: 138 mEq/L (ref 135–145)
Total Bilirubin: 0.7 mg/dL (ref 0.2–1.2)
Total Protein: 6.8 g/dL (ref 6.0–8.3)

## 2016-05-23 LAB — LIPID PANEL
Cholesterol: 247 mg/dL — ABNORMAL HIGH (ref 0–200)
HDL: 72.6 mg/dL (ref >39.00)
LDL Cholesterol: 144 mg/dL — ABNORMAL HIGH (ref 0–99)
NonHDL: 174.39
Total CHOL/HDL Ratio: 3
Triglycerides: 151 mg/dL — ABNORMAL HIGH (ref 0.0–149.0)
VLDL: 30.2 mg/dL (ref 0.0–40.0)

## 2016-05-23 LAB — PSA, MEDICARE: PSA: 0.66 ng/ml (ref 0.10–4.00)

## 2016-05-23 LAB — HEPATITIS C ANTIBODY: HCV Ab: NEGATIVE

## 2016-05-23 LAB — HEMOGLOBIN A1C: Hgb A1c MFr Bld: 5.7 % (ref 4.6–6.5)

## 2016-05-23 MED ORDER — DICLOFENAC SODIUM 1 % TD GEL: 2.0000 g | Freq: Four times a day (QID) | TRANSDERMAL | 1 refills | Status: DC

## 2016-05-23 MED ORDER — LISINOPRIL 20 MG PO TABS: 20.0000 mg | ORAL_TABLET | Freq: Every day | ORAL | 0 refills | Status: DC

## 2016-05-23 MED ORDER — METOPROLOL TARTRATE 25 MG PO TABS: 25.0000 mg | ORAL_TABLET | Freq: Every day | ORAL | 1 refills | Status: DC

## 2016-05-23 NOTE — Assessment & Plan Note (Signed)
Blood pressure appears adequately controlled and below goal 140/90 with current medication regimen and no adverse side effects. Continue to monitor.

## 2016-05-23 NOTE — Assessment & Plan Note (Signed)
Previously noted to have elevated blood sugar levels. Obtain A1c. Additional treatment pending results if needed.

## 2016-05-23 NOTE — Assessment & Plan Note (Signed)
Reviewed and updated patient's medical, surgical, family and social history. Medications and allergies were also reviewed. Basic screenings for depression, activities of daily living, hearing, cognition and safety were performed. Provider list was updated and health plan was provided to the patient.   Declines influenza, Zostavax, and pneumococcal vaccinations. All other immunizations are up-to-date per recommendations. Obtain PSA for prostate cancer screening. Obtain hepatitis C antibody for hepatitis C screening. Due for a dental exam encouraged to be completed independently. All other screenings are up-to-date per recommendations.

## 2016-05-23 NOTE — Patient Instructions (Addendum)
Thank you for choosing [facility].  SUMMARY AND INSTRUCTIONS:  Medication:  Please continue to take your medications as prescribed.  Your prescription(s) have been submitted to your pharmacy or been printed and provided for you. Please take as directed and contact our office if you believe you are having problem(s) with the medication(s) or have any questions.  Labs:  Please stop by the lab on the lower level of the building for your blood work. Your results will be released to Ken Caryl (or called to you) after review, usually within 72 hours after test completion. If any changes need to be made, you will be notified at that same time.  1.) The lab is open from 7:30am to 5:30 pm Monday-Friday 2.) No appointment is necessary 3.) Fasting (if needed) is 6-8 hours after food and drink; black coffee and water are okay   Follow up:  If your symptoms worsen or fail to improve, please contact our office for further instruction, or in case of emergency go directly to the emergency room at the closest medical facility.   Health Maintenance  Topic Date Due  . Hepatitis C Screening  [birth year]  . INFLUENZA VACCINE  09/07/2016 (Originally 01/09/2016)  . ZOSTAVAX  05/12/2017 (Originally 02/27/2005)  . PNA vac Low Risk Adult (1 of 2 - PCV13) 05/12/2017 (Originally 02/27/2010)  . COLONOSCOPY  02/14/2020  . TETANUS/TDAP  05/10/2020   Health Maintenance, Male A healthy lifestyle and preventative care can promote health and wellness.  Maintain regular health, dental, and eye exams.  Eat a healthy diet. Foods like vegetables, fruits, whole grains, low-fat dairy products, and lean protein foods contain the nutrients you need and are low in calories. Decrease your intake of foods high in solid fats, added sugars, and salt. Get information about a proper diet from your health care provider, if necessary.  Regular physical exercise is one of the most important things you can do for your health. Most  adults should get at least 150 minutes of moderate-intensity exercise (any activity that increases your heart rate and causes you to sweat) each week. In addition, most adults need muscle-strengthening exercises on 2 or more days a week.   Maintain a healthy weight. The body mass index (BMI) is a screening tool to identify possible weight problems. It provides an estimate of body fat based on height and weight. Your health care provider can find your BMI and can help you achieve or maintain a healthy weight. For males 20 years and older:  A BMI below 18.5 is considered underweight.  A BMI of 18.5 to 24.9 is normal.  A BMI of 25 to 29.9 is considered overweight.  A BMI of 30 and above is considered obese.  Maintain normal blood lipids and cholesterol by exercising and minimizing your intake of saturated fat. Eat a balanced diet with plenty of fruits and vegetables. Blood tests for lipids and cholesterol should begin at age 34 and be repeated every 5 years. If your lipid or cholesterol levels are high, you are over age 32, or you are at high risk for heart disease, you may need your cholesterol levels checked more frequently.Ongoing high lipid and cholesterol levels should be treated with medicines if diet and exercise are not working.  If you smoke, find out from your health care provider how to quit. If you do not use tobacco, do not start.  Lung cancer screening is recommended for adults aged 60-80 years who are at high risk for developing lung cancer  because of a history of smoking. A yearly low-dose CT scan of the lungs is recommended for people who have at least a 30-pack-year history of smoking and are current smokers or have quit within the past 15 years. A pack year of smoking is smoking an average of 1 pack of cigarettes a day for 1 year (for example, a 30-pack-year history of smoking could mean smoking 1 pack a day for 30 years or 2 packs a day for 15 years). Yearly screening should  continue until the smoker has stopped smoking for at least 15 years. Yearly screening should be stopped for people who develop a health problem that would prevent them from having lung cancer treatment.  If you choose to drink alcohol, do not have more than 2 drinks per day. One drink is considered to be 12 oz (360 mL) of beer, 5 oz (150 mL) of wine, or 1.5 oz (45 mL) of liquor.  Avoid the use of street drugs. Do not share needles with anyone. Ask for help if you need support or instructions about stopping the use of drugs.  High blood pressure causes heart disease and increases the risk of stroke. High blood pressure is more likely to develop in:  People who have blood pressure in the end of the normal range (100-139/85-89 mm Hg).  People who are overweight or obese.  People who are African American.  If you are 42-7 years of age, have your blood pressure checked every 3-5 years. If you are 42 years of age or older, have your blood pressure checked every year. You should have your blood pressure measured twice-once when you are at a hospital or clinic, and once when you are not at a hospital or clinic. Record the average of the two measurements. To check your blood pressure when you are not at a hospital or clinic, you can use:  An automated blood pressure machine at a pharmacy.  A home blood pressure monitor.  If you are 10-46 years old, ask your health care provider if you should take aspirin to prevent heart disease.  Diabetes screening involves taking a blood sample to check your fasting blood sugar level. This should be done once every 3 years after age 79 if you are at a normal weight and without risk factors for diabetes. Testing should be considered at a younger age or be carried out more frequently if you are overweight and have at least 1 risk factor for diabetes.  Colorectal cancer can be detected and often prevented. Most routine colorectal cancer screening begins at the age of  60 and continues through age 27. However, your health care provider may recommend screening at an earlier age if you have risk factors for colon cancer. On a yearly basis, your health care provider may provide home test kits to check for hidden blood in the stool. A small camera at the end of a tube may be used to directly examine the colon (sigmoidoscopy or colonoscopy) to detect the earliest forms of colorectal cancer. Talk to your health care provider about this at age 61 when routine screening begins. A direct exam of the colon should be repeated every 5-10 years through age 15, unless early forms of precancerous polyps or small growths are found.  People who are at an increased risk for hepatitis B should be screened for this virus. You are considered at high risk for hepatitis B if:  You were born in a country where hepatitis B occurs often.  Talk with your health care provider about which countries are considered high risk.  Your parents were born in a high-risk country and you have not received a shot to protect against hepatitis B (hepatitis B vaccine).  You have HIV or AIDS.  You use needles to inject street drugs.  You live with, or have sex with, someone who has hepatitis B.  You are a man who has sex with other men (MSM).  You get hemodialysis treatment.  You take certain medicines for conditions like cancer, organ transplantation, and autoimmune conditions.  Hepatitis C blood testing is recommended for all people born from [birth year] through 1966 and any individual with known risk factors for hepatitis C.  Healthy men should no longer receive prostate-specific antigen (PSA) blood tests as part of routine cancer screening. Talk to your health care provider about prostate cancer screening.  Testicular cancer screening is not recommended for adolescents or adult males who have no symptoms. Screening includes self-exam, a health care provider exam, and other screening tests. Consult with  your health care provider about any symptoms you have or any concerns you have about testicular cancer.  Practice safe sex. Use condoms and avoid high-risk sexual practices to reduce the spread of sexually transmitted infections (STIs).  You should be screened for STIs, including gonorrhea and chlamydia if:  You are sexually active and are younger than 24 years.  You are older than 24 years, and your health care provider tells you that you are at risk for this type of infection.  Your sexual activity has changed since you were last screened, and you are at an increased risk for chlamydia or gonorrhea. Ask your health care provider if you are at risk.  If you are at risk of being infected with HIV, it is recommended that you take a prescription medicine daily to prevent HIV infection. This is called pre-exposure prophylaxis (PrEP). You are considered at risk if:  You are a man who has sex with other men (MSM).  You are a heterosexual man who is sexually active with multiple partners.  You take drugs by injection.  You are sexually active with a partner who has HIV.  Talk with your health care provider about whether you are at high risk of being infected with HIV. If you choose to begin PrEP, you should first be tested for HIV. You should then be tested every 3 months for as long as you are taking PrEP.  Use sunscreen. Apply sunscreen liberally and repeatedly throughout the day. You should seek shade when your shadow is shorter than you. Protect yourself by wearing long sleeves, pants, a wide-brimmed hat, and sunglasses year round whenever you are outdoors.  Tell your health care provider of new moles or changes in moles, especially if there is a change in shape or color. Also, tell your health care provider if a mole is larger than the size of a pencil eraser.  A one-time screening for abdominal aortic aneurysm (AAA) and surgical repair of large AAAs by ultrasound is recommended for men  aged 25-75 years who are current or former smokers.  Stay current with your vaccines (immunizations). This information is not intended to replace advice given to you by your health care provider. Make sure you discuss any questions you have with your health care provider. Document Released: 11/23/2007 Document Revised: 06/17/2014 Document Reviewed: 02/28/2015 Elsevier Interactive Patient Education  2017 [employer].

## 2016-05-23 NOTE — Assessment & Plan Note (Signed)
Hyperlipidemia appears stable and not currently maintained on medication. Continue lifestyle management pending lipid profile results.

## 2016-05-23 NOTE — Progress Notes (Signed)
Subjective:    Patient ID: Roger Stewart, male    DOB: 06-30-1945, 70 y.o.   MRN: [id number]  Chief Complaint  Patient presents with  . CPE    fasting    HPI:  Roger Stewart is a 70 y.o. male who presents today for a Medicare Annual Wellness/Physical exam.    1) Health Maintenance -   Diet - Averages about 3 meals per day consisting of a regular diet;  No caffeine intake  Exercise - Stretches and general workouts 3-4 times per week.   2) Preventative Exams / Immunizations:  Dental -- Due for exam  Vision -- Up to date   Health Maintenance  Topic Date Due  . Hepatitis C Screening  Jan 28, 1946  . INFLUENZA VACCINE  09/07/2016 (Originally 01/09/2016)  . ZOSTAVAX  05/12/2017 (Originally 02/27/2005)  . PNA vac Low Risk Adult (1 of 2 - PCV13) 05/12/2017 (Originally 02/27/2010)  . COLONOSCOPY  02/14/2020  . TETANUS/TDAP  05/10/2020     Immunization History  Administered Date(s) Administered  . Td 06/11/1999, 05/10/2010    RISK FACTORS  Tobacco History  Smoking Status  . Current Some Day Smoker  . Types: Cigars  . Last attempt to quit: 06/10/2000  Smokeless Tobacco  . Never Used    Comment:  Smoked [id number], up to 3/4 ppd. 05/22/15 1 cigar a day     Cardiac risk factors: advanced age (older than 50 for men, 68 for women), dyslipidemia, hypertension and male gender.  Depression Screen  Depression screen [facility] 2/9 05/23/2016  Decreased Interest 0  Down, Depressed, Hopeless 0  PHQ - 2 Score 0     Activities of Daily Living In your present state of health, do you have any difficulty performing the following activities?:  Driving? No Managing money?  No Feeding yourself? No Getting from bed to chair? No Climbing a flight of stairs? No Preparing food and eating?: No Bathing or showering? No Getting dressed: No Getting to the toilet? No Using the toilet: No Moving around from place to place: No In the past year have you fallen or had a near  fall?:No   Home Safety Has smoke detector and wears seat belts. No excess sun exposure. Are there smokers in your home (other than you)?  No Do you feel safe at home?  Yes  Hearing Difficulties: No Do you often ask people to speak up or repeat themselves? No Do you experience ringing or noises in your ears? No  Do you have difficulty understanding soft or whispered voices? No    Cognitive Testing  Alert? Yes   Normal Appearance? Yes  Oriented to person? Yes  Place? Yes   Time? Yes  Recall of three objects?  Yes  Can perform simple calculations? Yes  Displays appropriate judgment? Yes  Can read the correct time from a watch face? Yes  Do you feel that you have a problem with memory? No  Do you often misplace items? No   Advanced Directives have been discussed with the patient? Yes   Current Physicians/Providers and Suppliers  1. Terri Piedra, FNP - Internal Medicine  Indicate any recent Medical Services you may have received from other than [facility] providers in the past year (date may be approximate).  All answers were reviewed with the patient and necessary referrals were made:  Mauricio Po, [place]   05/23/2016    No Known Allergies   Outpatient Medications Prior to Visit  Medication Sig Dispense Refill  . diclofenac (  VOLTAREN) 25 MG EC tablet TAKE 1 TABLET BY MOUTH TWICE DAILY AS NEEDED 180 tablet 0  . traMADol (ULTRAM) 50 MG tablet Take 1 tablet (50 mg total) by mouth every 8 (eight) hours as needed. 30 tablet 0  . diclofenac sodium (VOLTAREN) 1 % GEL Apply 2 g topically 4 (four) times daily. 100 g 1  . lisinopril (PRINIVIL,ZESTRIL) 20 MG tablet TAKE 1 TABLET BY MOUTH DAILY 90 tablet 0  . metoprolol tartrate (LOPRESSOR) 25 MG tablet Take 1 tablet (25 mg total) by mouth daily. 180 tablet 1   No facility-administered medications prior to visit.      Past Medical History:  Diagnosis Date  . Cancer ([place])    Basal Cell X 8; Dr [place] Crane  . Colon polyps    Dr Amedeo Plenty  .  Diverticulosis of colon   . Hyperlipidemia    elevated TG  . Hypertension   . Sciatica      Past Surgical History:  Procedure Laterality Date  . ABDOMINAL AORTIC ANEURYSM REPAIR  2005  . colonoscopy with polypectomy  2010;2016   Dr Amedeo Plenty  . ESI X3     LS spine  . LUMBAR SPINE SURGERY  04/2010   spur & bulging disc L 4-5, [place] , [place]  08/2010   cyst resected @ op site     Family History  Problem Relation Age of Onset  . Heart disease Mother     CHF ,CABG  . Prostate cancer Father   . Hypertension Father   . Transient ischemic attack Father     in 22s  . Hypertension Brother   . Acromegaly Brother   . Prostate cancer Brother   . Leukemia Brother   . Diabetes Neg Hx      Social History   Social History  . Marital status: Married    Spouse name: N/A  . Number of children: 1  . Years of education: 66   Occupational History  . Not on file.   Social History Main Topics  . Smoking status: Current Some Day Smoker    Types: Cigars    Last attempt to quit: 06/10/2000  . Smokeless tobacco: Never Used     Comment:  Smoked [phone], up to 3/4 ppd. 05/22/15 1 cigar a day  . Alcohol use 8.4 oz/week    14 Cans of beer per week  . Drug use: No  . Sexual activity: Not on file   Other Topics Concern  . Not on file   Social History Narrative   Denies abuse and feels safe at home.      Review of Systems  Constitutional: Denies fever, chills, fatigue, or significant weight gain/loss. HENT: Head: Denies headache or neck pain Ears: Denies changes in hearing, ringing in ears, earache, drainage Nose: Denies discharge, stuffiness, itching, nosebleed, sinus pain Throat: Denies sore throat, hoarseness, dry mouth, sores, thrush Eyes: Denies loss/changes in vision, pain, redness, blurry/double vision, flashing lights Cardiovascular: Denies chest pain/discomfort, tightness, palpitations, shortness of breath with activity, difficulty lying down,  swelling, sudden awakening with shortness of breath Respiratory: Denies shortness of breath, cough, sputum production, wheezing Gastrointestinal: Denies dysphasia, heartburn, change in appetite, nausea, change in bowel habits, rectal bleeding, constipation, diarrhea, yellow skin or eyes Genitourinary: Denies frequency, urgency, burning/pain, blood in urine, incontinence, change in urinary strength. Musculoskeletal: Denies muscle/joint pain, stiffness, back pain, redness or swelling of joints, trauma Skin: Denies rashes, lumps, itching, dryness, color changes, or hair/nail changes Neurological:  Denies dizziness, fainting, seizures, weakness, numbness, tingling, tremor Psychiatric - Denies nervousness, stress, depression or memory loss Endocrine: Denies heat or cold intolerance, sweating, frequent urination, excessive thirst, changes in appetite Hematologic: Denies ease of bruising or bleeding    Objective:     BP 122/88 (BP Location: Left Arm, Patient Position: Sitting, Cuff Size: Large)   Pulse 60   Temp 97.8 F (36.6 C) (Oral)   Resp 16   Ht 6\' 1"  (1.854 m)   Wt 211 lb (95.7 kg)   SpO2 96%   BMI 27.84 kg/m  Nursing note and vital signs reviewed.  Physical Exam  Constitutional: He is oriented to person, place, and time. He appears well-developed and well-nourished. No distress.  Cardiovascular: Normal rate, regular rhythm, normal heart sounds and intact distal pulses.   Pulmonary/Chest: Effort normal and breath sounds normal.  Neurological: He is alert and oriented to person, place, and time.  Skin: Skin is warm and dry.  Psychiatric: He has a normal mood and affect. His behavior is normal. Judgment and thought content normal.       Assessment & Plan:   During the course of the visit the patient was educated and counseled about appropriate screening and preventive services including:    Pneumococcal vaccine   Influenza vaccine  Hepatitis B vaccine  Prostate cancer  screening  Colorectal cancer screening  Nutrition counseling   Diet review for nutrition referral? Yes ____  Not Indicated _X___   Patient Instructions (the written plan) was given to the patient.  Medicare Attestation I have personally reviewed: The patient's medical and social history Their use of alcohol, tobacco or illicit drugs Their current medications and supplements The patient's functional ability including ADLs,fall risks, home safety risks, cognitive, and hearing and visual impairment Diet and physical activities Evidence for depression or mood disorders  The patient's weight, height, BMI,  have been recorded in the chart.  I have made referrals, counseling, and provided education to the patient based on review of the above and I have provided the patient with a written personalized care plan for preventive services.     Problem List Items Addressed This Visit      Cardiovascular and Mediastinum   Essential hypertension    Blood pressure appears adequately controlled and below goal 140/90 with current medication regimen and no adverse side effects. Continue to monitor.      Relevant Medications   lisinopril (PRINIVIL,ZESTRIL) 20 MG tablet   metoprolol tartrate (LOPRESSOR) 25 MG tablet   Other Relevant Orders   CBC   Comprehensive metabolic panel     Nervous and Auditory   Lumbar radiculopathy    Continues to experience lumbar radiculopathy with mild sciatica currently being treated by chiropractics. Consider additional referrals for possible cortisone injection if symptoms worsen or do not improve.      Relevant Medications   diclofenac sodium (VOLTAREN) 1 % GEL     Other   Hyperlipidemia    Hyperlipidemia appears stable and not currently maintained on medication. Continue lifestyle management pending lipid profile results.      Relevant Medications   lisinopril (PRINIVIL,ZESTRIL) 20 MG tablet   metoprolol tartrate (LOPRESSOR) 25 MG tablet   Other  Relevant Orders   CBC   Comprehensive metabolic panel   Lipid panel   Other abnormal glucose    Previously noted to have elevated blood sugar levels. Obtain A1c. Additional treatment pending results if needed.      Relevant Orders   Hemoglobin A1c  Medicare annual wellness visit, subsequent - Primary    Reviewed and updated patient's medical, surgical, family and social history. Medications and allergies were also reviewed. Basic screenings for depression, activities of daily living, hearing, cognition and safety were performed. Provider list was updated and health plan was provided to the patient.   Declines influenza, Zostavax, and pneumococcal vaccinations. All other immunizations are up-to-date per recommendations. Obtain PSA for prostate cancer screening. Obtain hepatitis C antibody for hepatitis C screening. Due for a dental exam encouraged to be completed independently. All other screenings are up-to-date per recommendations.       Other Visit Diagnoses    Screening for prostate cancer       Relevant Orders   PSA, Medicare   Other problems related to lifestyle       Relevant Orders   Hepatitis C antibody (Completed)       I have changed Roger Stewart's lisinopril. I am also having him maintain his traMADol, diclofenac, metoprolol tartrate, and diclofenac sodium.   Meds ordered this encounter  Medications  . lisinopril (PRINIVIL,ZESTRIL) 20 MG tablet    Sig: Take 1 tablet (20 mg total) by mouth daily.    Dispense:  90 tablet    Refill:  0    Order Specific Question:   Supervising Provider    Answer:   Pricilla Holm A [id number]  . metoprolol tartrate (LOPRESSOR) 25 MG tablet    Sig: Take 1 tablet (25 mg total) by mouth daily.    Dispense:  180 tablet    Refill:  1    Do not fill until patient requests refill.    Order Specific Question:   Supervising Provider    Answer:   Pricilla Holm A [id number]  . diclofenac sodium (VOLTAREN) 1 % GEL    Sig: Apply 2 g  topically 4 (four) times daily.    Dispense:  100 g    Refill:  1    Order Specific Question:   Supervising Provider    Answer:   Pricilla Holm A [id number]     Follow-up: Return in about 6 months (around 11/21/2016), or if symptoms worsen or fail to improve.   Mauricio Po, FNP

## 2016-05-23 NOTE — Assessment & Plan Note (Signed)
Continues to experience lumbar radiculopathy with mild sciatica currently being treated by chiropractics. Consider additional referrals for possible cortisone injection if symptoms worsen or do not improve.

## 2016-05-24 LAB — CBC
HCT: 40.1 % (ref 39.0–52.0)
Hemoglobin: 13.8 g/dL (ref 13.0–17.0)
MCHC: 34.3 g/dL (ref 30.0–36.0)
MCV: 95.1 fl (ref 78.0–100.0)
Platelets: 211 10*3/uL (ref 150.0–400.0)
RBC: 4.22 Mil/uL (ref 4.22–5.81)
RDW: 13.7 % (ref 11.5–15.5)
WBC: 8.2 10*3/uL (ref 4.0–10.5)

## 2016-05-26 ENCOUNTER — Encounter: Payer: Self-pay | Admitting: Family

## 2016-06-11 ENCOUNTER — Telehealth: Payer: Self-pay | Admitting: Geriatric Medicine

## 2016-06-11 DIAGNOSIS — M9904 Segmental and somatic dysfunction of sacral region: Secondary | ICD-10-CM | POA: Diagnosis not present

## 2016-06-11 DIAGNOSIS — M4726 Other spondylosis with radiculopathy, lumbar region: Secondary | ICD-10-CM | POA: Diagnosis not present

## 2016-06-11 DIAGNOSIS — M9903 Segmental and somatic dysfunction of lumbar region: Secondary | ICD-10-CM | POA: Diagnosis not present

## 2016-06-11 DIAGNOSIS — M545 Low back pain: Secondary | ICD-10-CM | POA: Diagnosis not present

## 2016-06-11 DIAGNOSIS — M9905 Segmental and somatic dysfunction of pelvic region: Secondary | ICD-10-CM | POA: Diagnosis not present

## 2016-06-11 NOTE — Telephone Encounter (Signed)
PA initiated for Diclofenac Gel. Will be notified within 72 hours.

## 2016-06-12 DIAGNOSIS — M4726 Other spondylosis with radiculopathy, lumbar region: Secondary | ICD-10-CM | POA: Diagnosis not present

## 2016-06-12 DIAGNOSIS — M9904 Segmental and somatic dysfunction of sacral region: Secondary | ICD-10-CM | POA: Diagnosis not present

## 2016-06-12 DIAGNOSIS — M545 Low back pain: Secondary | ICD-10-CM | POA: Diagnosis not present

## 2016-06-12 DIAGNOSIS — M9903 Segmental and somatic dysfunction of lumbar region: Secondary | ICD-10-CM | POA: Diagnosis not present

## 2016-06-12 DIAGNOSIS — M9905 Segmental and somatic dysfunction of pelvic region: Secondary | ICD-10-CM | POA: Diagnosis not present

## 2016-06-13 DIAGNOSIS — M545 Low back pain: Secondary | ICD-10-CM | POA: Diagnosis not present

## 2016-06-13 DIAGNOSIS — M9903 Segmental and somatic dysfunction of lumbar region: Secondary | ICD-10-CM | POA: Diagnosis not present

## 2016-06-13 DIAGNOSIS — M9905 Segmental and somatic dysfunction of pelvic region: Secondary | ICD-10-CM | POA: Diagnosis not present

## 2016-06-13 DIAGNOSIS — M4726 Other spondylosis with radiculopathy, lumbar region: Secondary | ICD-10-CM | POA: Diagnosis not present

## 2016-06-13 DIAGNOSIS — M9904 Segmental and somatic dysfunction of sacral region: Secondary | ICD-10-CM | POA: Diagnosis not present

## 2016-06-14 DIAGNOSIS — M9905 Segmental and somatic dysfunction of pelvic region: Secondary | ICD-10-CM | POA: Diagnosis not present

## 2016-06-14 DIAGNOSIS — M9904 Segmental and somatic dysfunction of sacral region: Secondary | ICD-10-CM | POA: Diagnosis not present

## 2016-06-14 DIAGNOSIS — M545 Low back pain: Secondary | ICD-10-CM | POA: Diagnosis not present

## 2016-06-14 DIAGNOSIS — M9903 Segmental and somatic dysfunction of lumbar region: Secondary | ICD-10-CM | POA: Diagnosis not present

## 2016-06-14 DIAGNOSIS — M4726 Other spondylosis with radiculopathy, lumbar region: Secondary | ICD-10-CM | POA: Diagnosis not present

## 2016-06-17 NOTE — Telephone Encounter (Signed)
PA for diclofenac gel was denied. Is there an alternative that can be sent in.

## 2016-06-18 NOTE — Telephone Encounter (Signed)
There are no prescriptive alternatives with the exception of a lidocaine cream. Otherwise he can try over the counter Solanpas or other Biofreeze or aspercreme.

## 2016-06-19 NOTE — Telephone Encounter (Signed)
Pt aware. Gave him the OTC alternatives

## 2016-07-04 DIAGNOSIS — Z08 Encounter for follow-up examination after completed treatment for malignant neoplasm: Secondary | ICD-10-CM | POA: Diagnosis not present

## 2016-07-04 DIAGNOSIS — L821 Other seborrheic keratosis: Secondary | ICD-10-CM | POA: Diagnosis not present

## 2016-07-04 DIAGNOSIS — L578 Other skin changes due to chronic exposure to nonionizing radiation: Secondary | ICD-10-CM | POA: Diagnosis not present

## 2016-07-04 DIAGNOSIS — Z85828 Personal history of other malignant neoplasm of skin: Secondary | ICD-10-CM | POA: Diagnosis not present

## 2016-07-04 DIAGNOSIS — L570 Actinic keratosis: Secondary | ICD-10-CM | POA: Diagnosis not present

## 2016-07-04 DIAGNOSIS — X32XXXA Exposure to sunlight, initial encounter: Secondary | ICD-10-CM | POA: Diagnosis not present

## 2016-07-04 DIAGNOSIS — D485 Neoplasm of uncertain behavior of skin: Secondary | ICD-10-CM | POA: Diagnosis not present

## 2016-07-04 DIAGNOSIS — C44719 Basal cell carcinoma of skin of left lower limb, including hip: Secondary | ICD-10-CM | POA: Diagnosis not present

## 2016-07-09 DIAGNOSIS — M9904 Segmental and somatic dysfunction of sacral region: Secondary | ICD-10-CM | POA: Diagnosis not present

## 2016-07-09 DIAGNOSIS — M9905 Segmental and somatic dysfunction of pelvic region: Secondary | ICD-10-CM | POA: Diagnosis not present

## 2016-07-09 DIAGNOSIS — M9903 Segmental and somatic dysfunction of lumbar region: Secondary | ICD-10-CM | POA: Diagnosis not present

## 2016-07-09 DIAGNOSIS — M4726 Other spondylosis with radiculopathy, lumbar region: Secondary | ICD-10-CM | POA: Diagnosis not present

## 2016-07-09 DIAGNOSIS — M545 Low back pain: Secondary | ICD-10-CM | POA: Diagnosis not present

## 2016-07-10 DIAGNOSIS — M4726 Other spondylosis with radiculopathy, lumbar region: Secondary | ICD-10-CM | POA: Diagnosis not present

## 2016-07-10 DIAGNOSIS — M9904 Segmental and somatic dysfunction of sacral region: Secondary | ICD-10-CM | POA: Diagnosis not present

## 2016-07-10 DIAGNOSIS — M9905 Segmental and somatic dysfunction of pelvic region: Secondary | ICD-10-CM | POA: Diagnosis not present

## 2016-07-10 DIAGNOSIS — M545 Low back pain: Secondary | ICD-10-CM | POA: Diagnosis not present

## 2016-07-10 DIAGNOSIS — M9903 Segmental and somatic dysfunction of lumbar region: Secondary | ICD-10-CM | POA: Diagnosis not present

## 2016-07-11 DIAGNOSIS — M4726 Other spondylosis with radiculopathy, lumbar region: Secondary | ICD-10-CM | POA: Diagnosis not present

## 2016-07-11 DIAGNOSIS — M9903 Segmental and somatic dysfunction of lumbar region: Secondary | ICD-10-CM | POA: Diagnosis not present

## 2016-07-11 DIAGNOSIS — M9905 Segmental and somatic dysfunction of pelvic region: Secondary | ICD-10-CM | POA: Diagnosis not present

## 2016-07-11 DIAGNOSIS — M9904 Segmental and somatic dysfunction of sacral region: Secondary | ICD-10-CM | POA: Diagnosis not present

## 2016-07-11 DIAGNOSIS — M545 Low back pain: Secondary | ICD-10-CM | POA: Diagnosis not present

## 2016-07-25 DIAGNOSIS — M47817 Spondylosis without myelopathy or radiculopathy, lumbosacral region: Secondary | ICD-10-CM | POA: Diagnosis not present

## 2016-07-25 DIAGNOSIS — M47812 Spondylosis without myelopathy or radiculopathy, cervical region: Secondary | ICD-10-CM | POA: Diagnosis not present

## 2016-07-31 DIAGNOSIS — M48061 Spinal stenosis, lumbar region without neurogenic claudication: Secondary | ICD-10-CM | POA: Diagnosis not present

## 2016-07-31 DIAGNOSIS — M4712 Other spondylosis with myelopathy, cervical region: Secondary | ICD-10-CM | POA: Diagnosis not present

## 2016-08-08 DIAGNOSIS — M47812 Spondylosis without myelopathy or radiculopathy, cervical region: Secondary | ICD-10-CM | POA: Diagnosis not present

## 2016-08-08 DIAGNOSIS — M47817 Spondylosis without myelopathy or radiculopathy, lumbosacral region: Secondary | ICD-10-CM | POA: Diagnosis not present

## 2016-08-28 DIAGNOSIS — I714 Abdominal aortic aneurysm, without rupture: Secondary | ICD-10-CM | POA: Diagnosis not present

## 2016-09-06 ENCOUNTER — Other Ambulatory Visit: Payer: Self-pay | Admitting: Family

## 2016-09-06 DIAGNOSIS — I10 Essential (primary) hypertension: Secondary | ICD-10-CM

## 2016-09-23 DIAGNOSIS — M9903 Segmental and somatic dysfunction of lumbar region: Secondary | ICD-10-CM | POA: Diagnosis not present

## 2016-09-23 DIAGNOSIS — M545 Low back pain: Secondary | ICD-10-CM | POA: Diagnosis not present

## 2016-09-23 DIAGNOSIS — M4726 Other spondylosis with radiculopathy, lumbar region: Secondary | ICD-10-CM | POA: Diagnosis not present

## 2016-09-23 DIAGNOSIS — M9904 Segmental and somatic dysfunction of sacral region: Secondary | ICD-10-CM | POA: Diagnosis not present

## 2016-09-23 DIAGNOSIS — M9905 Segmental and somatic dysfunction of pelvic region: Secondary | ICD-10-CM | POA: Diagnosis not present

## 2016-09-25 DIAGNOSIS — M9904 Segmental and somatic dysfunction of sacral region: Secondary | ICD-10-CM | POA: Diagnosis not present

## 2016-09-25 DIAGNOSIS — M9905 Segmental and somatic dysfunction of pelvic region: Secondary | ICD-10-CM | POA: Diagnosis not present

## 2016-09-25 DIAGNOSIS — M545 Low back pain: Secondary | ICD-10-CM | POA: Diagnosis not present

## 2016-09-25 DIAGNOSIS — M9903 Segmental and somatic dysfunction of lumbar region: Secondary | ICD-10-CM | POA: Diagnosis not present

## 2016-09-25 DIAGNOSIS — M4726 Other spondylosis with radiculopathy, lumbar region: Secondary | ICD-10-CM | POA: Diagnosis not present

## 2016-09-26 DIAGNOSIS — M4726 Other spondylosis with radiculopathy, lumbar region: Secondary | ICD-10-CM | POA: Diagnosis not present

## 2016-09-26 DIAGNOSIS — M9905 Segmental and somatic dysfunction of pelvic region: Secondary | ICD-10-CM | POA: Diagnosis not present

## 2016-09-26 DIAGNOSIS — M545 Low back pain: Secondary | ICD-10-CM | POA: Diagnosis not present

## 2016-09-26 DIAGNOSIS — M9903 Segmental and somatic dysfunction of lumbar region: Secondary | ICD-10-CM | POA: Diagnosis not present

## 2016-09-26 DIAGNOSIS — M9904 Segmental and somatic dysfunction of sacral region: Secondary | ICD-10-CM | POA: Diagnosis not present

## 2016-09-27 DIAGNOSIS — M9905 Segmental and somatic dysfunction of pelvic region: Secondary | ICD-10-CM | POA: Diagnosis not present

## 2016-09-27 DIAGNOSIS — M9903 Segmental and somatic dysfunction of lumbar region: Secondary | ICD-10-CM | POA: Diagnosis not present

## 2016-09-27 DIAGNOSIS — M4726 Other spondylosis with radiculopathy, lumbar region: Secondary | ICD-10-CM | POA: Diagnosis not present

## 2016-09-27 DIAGNOSIS — M9904 Segmental and somatic dysfunction of sacral region: Secondary | ICD-10-CM | POA: Diagnosis not present

## 2016-09-27 DIAGNOSIS — M545 Low back pain: Secondary | ICD-10-CM | POA: Diagnosis not present

## 2016-09-30 DIAGNOSIS — M545 Low back pain: Secondary | ICD-10-CM | POA: Diagnosis not present

## 2016-09-30 DIAGNOSIS — M4726 Other spondylosis with radiculopathy, lumbar region: Secondary | ICD-10-CM | POA: Diagnosis not present

## 2016-09-30 DIAGNOSIS — M9904 Segmental and somatic dysfunction of sacral region: Secondary | ICD-10-CM | POA: Diagnosis not present

## 2016-09-30 DIAGNOSIS — M9905 Segmental and somatic dysfunction of pelvic region: Secondary | ICD-10-CM | POA: Diagnosis not present

## 2016-09-30 DIAGNOSIS — M9903 Segmental and somatic dysfunction of lumbar region: Secondary | ICD-10-CM | POA: Diagnosis not present

## 2016-10-01 DIAGNOSIS — M9903 Segmental and somatic dysfunction of lumbar region: Secondary | ICD-10-CM | POA: Diagnosis not present

## 2016-10-01 DIAGNOSIS — M545 Low back pain: Secondary | ICD-10-CM | POA: Diagnosis not present

## 2016-10-01 DIAGNOSIS — M9905 Segmental and somatic dysfunction of pelvic region: Secondary | ICD-10-CM | POA: Diagnosis not present

## 2016-10-01 DIAGNOSIS — M4726 Other spondylosis with radiculopathy, lumbar region: Secondary | ICD-10-CM | POA: Diagnosis not present

## 2016-10-01 DIAGNOSIS — M9904 Segmental and somatic dysfunction of sacral region: Secondary | ICD-10-CM | POA: Diagnosis not present

## 2016-10-02 DIAGNOSIS — M4726 Other spondylosis with radiculopathy, lumbar region: Secondary | ICD-10-CM | POA: Diagnosis not present

## 2016-10-02 DIAGNOSIS — M9903 Segmental and somatic dysfunction of lumbar region: Secondary | ICD-10-CM | POA: Diagnosis not present

## 2016-10-02 DIAGNOSIS — M545 Low back pain: Secondary | ICD-10-CM | POA: Diagnosis not present

## 2016-10-02 DIAGNOSIS — M9905 Segmental and somatic dysfunction of pelvic region: Secondary | ICD-10-CM | POA: Diagnosis not present

## 2016-10-02 DIAGNOSIS — M9904 Segmental and somatic dysfunction of sacral region: Secondary | ICD-10-CM | POA: Diagnosis not present

## 2016-10-04 ENCOUNTER — Encounter: Payer: Self-pay | Admitting: Family

## 2016-10-11 DIAGNOSIS — R201 Hypoesthesia of skin: Secondary | ICD-10-CM | POA: Diagnosis not present

## 2016-10-11 DIAGNOSIS — M5416 Radiculopathy, lumbar region: Secondary | ICD-10-CM | POA: Diagnosis not present

## 2016-10-11 DIAGNOSIS — Z01810 Encounter for preprocedural cardiovascular examination: Secondary | ICD-10-CM | POA: Diagnosis not present

## 2016-10-11 DIAGNOSIS — M4316 Spondylolisthesis, lumbar region: Secondary | ICD-10-CM | POA: Diagnosis not present

## 2016-10-11 DIAGNOSIS — R2689 Other abnormalities of gait and mobility: Secondary | ICD-10-CM | POA: Diagnosis not present

## 2016-10-11 DIAGNOSIS — R202 Paresthesia of skin: Secondary | ICD-10-CM | POA: Diagnosis not present

## 2016-10-11 DIAGNOSIS — Z01812 Encounter for preprocedural laboratory examination: Secondary | ICD-10-CM | POA: Diagnosis not present

## 2016-10-11 DIAGNOSIS — E119 Type 2 diabetes mellitus without complications: Secondary | ICD-10-CM | POA: Diagnosis not present

## 2016-10-11 DIAGNOSIS — I10 Essential (primary) hypertension: Secondary | ICD-10-CM | POA: Diagnosis not present

## 2016-10-11 DIAGNOSIS — M1610 Unilateral primary osteoarthritis, unspecified hip: Secondary | ICD-10-CM | POA: Diagnosis not present

## 2016-10-11 DIAGNOSIS — M545 Low back pain: Secondary | ICD-10-CM | POA: Diagnosis not present

## 2016-10-11 DIAGNOSIS — Z01818 Encounter for other preprocedural examination: Secondary | ICD-10-CM | POA: Diagnosis not present

## 2016-10-14 DIAGNOSIS — M48061 Spinal stenosis, lumbar region without neurogenic claudication: Secondary | ICD-10-CM | POA: Diagnosis not present

## 2016-10-14 DIAGNOSIS — Z01818 Encounter for other preprocedural examination: Secondary | ICD-10-CM | POA: Diagnosis not present

## 2016-10-14 DIAGNOSIS — M5116 Intervertebral disc disorders with radiculopathy, lumbar region: Secondary | ICD-10-CM | POA: Diagnosis not present

## 2016-10-15 DIAGNOSIS — M5126 Other intervertebral disc displacement, lumbar region: Secondary | ICD-10-CM | POA: Diagnosis not present

## 2016-10-15 DIAGNOSIS — M48062 Spinal stenosis, lumbar region with neurogenic claudication: Secondary | ICD-10-CM | POA: Diagnosis not present

## 2017-03-12 DIAGNOSIS — C44719 Basal cell carcinoma of skin of left lower limb, including hip: Secondary | ICD-10-CM | POA: Diagnosis not present

## 2017-03-12 DIAGNOSIS — D485 Neoplasm of uncertain behavior of skin: Secondary | ICD-10-CM | POA: Diagnosis not present

## 2017-03-12 DIAGNOSIS — C44311 Basal cell carcinoma of skin of nose: Secondary | ICD-10-CM | POA: Diagnosis not present

## 2017-03-12 DIAGNOSIS — L570 Actinic keratosis: Secondary | ICD-10-CM | POA: Diagnosis not present

## 2017-03-12 DIAGNOSIS — C44329 Squamous cell carcinoma of skin of other parts of face: Secondary | ICD-10-CM | POA: Diagnosis not present

## 2017-03-24 DIAGNOSIS — C44329 Squamous cell carcinoma of skin of other parts of face: Secondary | ICD-10-CM | POA: Diagnosis not present

## 2017-03-25 DIAGNOSIS — C44329 Squamous cell carcinoma of skin of other parts of face: Secondary | ICD-10-CM | POA: Diagnosis not present

## 2017-04-03 DIAGNOSIS — Z4802 Encounter for removal of sutures: Secondary | ICD-10-CM | POA: Diagnosis not present

## 2017-04-07 DIAGNOSIS — C44311 Basal cell carcinoma of skin of nose: Secondary | ICD-10-CM | POA: Diagnosis not present

## 2017-04-08 DIAGNOSIS — C44311 Basal cell carcinoma of skin of nose: Secondary | ICD-10-CM | POA: Diagnosis not present

## 2017-04-10 DIAGNOSIS — Z48817 Encounter for surgical aftercare following surgery on the skin and subcutaneous tissue: Secondary | ICD-10-CM | POA: Diagnosis not present

## 2017-04-14 DIAGNOSIS — M545 Low back pain: Secondary | ICD-10-CM | POA: Diagnosis not present

## 2017-04-14 DIAGNOSIS — M9903 Segmental and somatic dysfunction of lumbar region: Secondary | ICD-10-CM | POA: Diagnosis not present

## 2017-04-14 DIAGNOSIS — M47897 Other spondylosis, lumbosacral region: Secondary | ICD-10-CM | POA: Diagnosis not present

## 2017-04-14 DIAGNOSIS — M4726 Other spondylosis with radiculopathy, lumbar region: Secondary | ICD-10-CM | POA: Diagnosis not present

## 2017-04-14 DIAGNOSIS — M9905 Segmental and somatic dysfunction of pelvic region: Secondary | ICD-10-CM | POA: Diagnosis not present

## 2017-04-14 DIAGNOSIS — M9904 Segmental and somatic dysfunction of sacral region: Secondary | ICD-10-CM | POA: Diagnosis not present

## 2017-04-15 DIAGNOSIS — M4726 Other spondylosis with radiculopathy, lumbar region: Secondary | ICD-10-CM | POA: Diagnosis not present

## 2017-04-15 DIAGNOSIS — M545 Low back pain: Secondary | ICD-10-CM | POA: Diagnosis not present

## 2017-04-15 DIAGNOSIS — M9903 Segmental and somatic dysfunction of lumbar region: Secondary | ICD-10-CM | POA: Diagnosis not present

## 2017-04-15 DIAGNOSIS — M47897 Other spondylosis, lumbosacral region: Secondary | ICD-10-CM | POA: Diagnosis not present

## 2017-04-15 DIAGNOSIS — M9905 Segmental and somatic dysfunction of pelvic region: Secondary | ICD-10-CM | POA: Diagnosis not present

## 2017-04-15 DIAGNOSIS — Z4802 Encounter for removal of sutures: Secondary | ICD-10-CM | POA: Diagnosis not present

## 2017-04-15 DIAGNOSIS — M9904 Segmental and somatic dysfunction of sacral region: Secondary | ICD-10-CM | POA: Diagnosis not present

## 2017-04-16 DIAGNOSIS — M9903 Segmental and somatic dysfunction of lumbar region: Secondary | ICD-10-CM | POA: Diagnosis not present

## 2017-04-16 DIAGNOSIS — M4726 Other spondylosis with radiculopathy, lumbar region: Secondary | ICD-10-CM | POA: Diagnosis not present

## 2017-04-16 DIAGNOSIS — M9904 Segmental and somatic dysfunction of sacral region: Secondary | ICD-10-CM | POA: Diagnosis not present

## 2017-04-16 DIAGNOSIS — M545 Low back pain: Secondary | ICD-10-CM | POA: Diagnosis not present

## 2017-04-16 DIAGNOSIS — M9905 Segmental and somatic dysfunction of pelvic region: Secondary | ICD-10-CM | POA: Diagnosis not present

## 2017-04-16 DIAGNOSIS — M47897 Other spondylosis, lumbosacral region: Secondary | ICD-10-CM | POA: Diagnosis not present

## 2017-05-12 ENCOUNTER — Encounter: Payer: Self-pay | Admitting: Family Medicine

## 2017-05-12 ENCOUNTER — Ambulatory Visit (INDEPENDENT_AMBULATORY_CARE_PROVIDER_SITE_OTHER): Payer: Medicare Other | Admitting: Family Medicine

## 2017-05-12 VITALS — BP 136/80 | HR 79 | Temp 97.7°F | Ht 73.0 in | Wt 211.2 lb

## 2017-05-12 DIAGNOSIS — J209 Acute bronchitis, unspecified: Secondary | ICD-10-CM | POA: Diagnosis not present

## 2017-05-12 DIAGNOSIS — M5416 Radiculopathy, lumbar region: Secondary | ICD-10-CM | POA: Diagnosis not present

## 2017-05-12 DIAGNOSIS — I10 Essential (primary) hypertension: Secondary | ICD-10-CM

## 2017-05-12 DIAGNOSIS — M48061 Spinal stenosis, lumbar region without neurogenic claudication: Secondary | ICD-10-CM | POA: Diagnosis not present

## 2017-05-12 DIAGNOSIS — J440 Chronic obstructive pulmonary disease with acute lower respiratory infection: Secondary | ICD-10-CM | POA: Diagnosis not present

## 2017-05-12 DIAGNOSIS — M5431 Sciatica, right side: Secondary | ICD-10-CM | POA: Diagnosis not present

## 2017-05-12 DIAGNOSIS — Z8042 Family history of malignant neoplasm of prostate: Secondary | ICD-10-CM | POA: Diagnosis not present

## 2017-05-12 MED ORDER — DICLOFENAC SODIUM 1 % TD GEL: 2.0000 g | Freq: Four times a day (QID) | TRANSDERMAL | 1 refills | Status: DC

## 2017-05-12 NOTE — Progress Notes (Signed)
Patient declined his Flu Vaccine & Pneumonia vaccine.

## 2017-05-12 NOTE — Progress Notes (Signed)
Subjective:  Patient ID: Roger Stewart, male    DOB: 10-25-1945  Age: 71 y.o. MRN: [id number]  CC: Establish Care   HPI [name] presents for establishment of care.  He is nonfasting today.  His past history and family history were reviewed.  His father and her brother had prostate cancer.  Patient has no symptoms consistent with BPH and his PSAs have been normal.  He is on a regular schedule with his colonoscopies.  He smokes cigars presently and has smoked cigarettes in the distant past.  He has refused flu vaccines and the pneumonia vaccines.  He continues to play golf and has back issues associated with that sport.  He is status post a lumbar microdisc discectomy 1 year ago.  He does have problems with sciatica.  He sees a Restaurant manager, fast food for this.  He does do morning stretches.  He lives in [place] from January through May.  History Dashel has a past medical history of Cancer [facility]), Colon polyps, Diverticulosis of colon, Hyperlipidemia, Hypertension, and Sciatica.   He has a past surgical history that includes ESI X3; Abdominal aortic aneurysm repair (2005); Lumbar spine surgery (04/2010); Lumbar spine surgery (08/2010); and colonoscopy with polypectomy (2010;2016).   His family history includes Acromegaly in his brother; Heart disease in his mother; Hypertension in his brother and father; Leukemia in his brother; Prostate cancer in his brother and father; Transient ischemic attack in his father.He reports that he has been smoking cigars.  he has never used smokeless tobacco. He reports that he drinks about 8.4 oz of alcohol per week. He reports that he does not use drugs.  Outpatient Medications Prior to Visit  Medication Sig Dispense Refill  . diclofenac (VOLTAREN) 25 MG EC tablet TAKE 1 TABLET BY MOUTH TWICE DAILY AS NEEDED 180 tablet 0  . lisinopril (PRINIVIL,ZESTRIL) 20 MG tablet TAKE 1 TABLET(20 MG) BY MOUTH DAILY 90 tablet 2  . metoprolol tartrate (LOPRESSOR) 25 MG tablet Take 1  tablet (25 mg total) by mouth daily. 180 tablet 1  . traMADol (ULTRAM) 50 MG tablet Take 1 tablet (50 mg total) by mouth every 8 (eight) hours as needed. 30 tablet 0  . diclofenac sodium (VOLTAREN) 1 % GEL Apply 2 g topically 4 (four) times daily. 100 g 1   No facility-administered medications prior to visit.     ROS Review of Systems  Constitutional: Negative.   HENT: Negative.   Eyes: Negative.   Respiratory: Negative for shortness of breath and wheezing.   Cardiovascular: Negative for chest pain, palpitations and leg swelling.  Gastrointestinal: Negative.  Negative for anal bleeding and blood in stool.  Endocrine: Negative for polyphagia and polyuria.  Genitourinary: Negative for decreased urine volume, difficulty urinating and hematuria.  Musculoskeletal: Positive for back pain.  Skin: Negative.   Neurological: Negative for weakness and light-headedness.  Hematological: Does not bruise/bleed easily.  Psychiatric/Behavioral: Negative for agitation and behavioral problems.    Objective:  BP 136/80 (BP Location: Right Arm, Patient Position: Sitting, Cuff Size: Normal)   Pulse 79   Temp 97.7 F (36.5 C) (Oral)   Ht 6\' 1"  (1.854 m)   Wt 211 lb 4 oz (95.8 kg)   SpO2 95%   BMI 27.87 kg/m   Physical Exam  Constitutional: He is oriented to person, place, and time. He appears well-developed and well-nourished. No distress.  HENT:  Head: Normocephalic and atraumatic.  Right Ear: External ear normal.  Left Ear: External ear normal.  Mouth/Throat: Oropharynx  is clear and moist.  Eyes: Conjunctivae are normal. Pupils are equal, round, and reactive to light. Right eye exhibits no discharge. Left eye exhibits no discharge. No scleral icterus.  Neck: Neck supple. No JVD present. No tracheal deviation present. No thyromegaly present.  Cardiovascular: Normal rate, regular rhythm and normal heart sounds.  Pulmonary/Chest: No stridor. He has decreased breath sounds. He has no wheezes. He  has no rhonchi. He has no rales.  Abdominal: Bowel sounds are normal.  Musculoskeletal:       Lumbar back: He exhibits normal range of motion, no tenderness, no bony tenderness and no spasm.       Back:  Lymphadenopathy:    He has no cervical adenopathy.  Neurological: He is alert and oriented to person, place, and time. He has normal strength.  Neg dural tension signs.  There was sciatic notch ttp on the right.  Skin: Skin is warm and dry. He is not diaphoretic.     Psychiatric: He has a normal mood and affect. His behavior is normal.      Assessment & Plan:   Javien was seen today for establish care.  Diagnoses and all orders for this visit:  Essential hypertension -     CBC; Future -     Comprehensive metabolic panel; Future -     TSH; Future -     Urinalysis, Routine w reflex microscopic; Future  Lumbar radiculopathy -     diclofenac sodium (VOLTAREN) 1 % GEL; Apply 2 g topically 4 (four) times daily.  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 Hardwick maintain his traMADol, diclofenac, metoprolol tartrate, lisinopril, and diclofenac sodium.   We discussed using gabapentin for treatment of his sciatica.  We both agreed that it does not bother him often enough at this point to warrant medication therapy.  Patient refuses the flu and pneumonia vaccine.  I discussed the possible seriousness of a pneumonia considering he is age, history of COPD and tobacco use.  I have asked him to stop smoking cigars, even though he said that he was not inhaling the smoke.   Meds ordered this encounter  Medications  . diclofenac sodium (VOLTAREN) 1 % GEL    Sig: Apply 2 g topically 4 (four) times daily.    Dispense:  100 g    Refill:  1     Follow-up: No Follow-up on file.  Libby Maw, MD

## 2017-05-13 ENCOUNTER — Other Ambulatory Visit (INDEPENDENT_AMBULATORY_CARE_PROVIDER_SITE_OTHER): Payer: Medicare Other

## 2017-05-13 DIAGNOSIS — I10 Essential (primary) hypertension: Secondary | ICD-10-CM | POA: Diagnosis not present

## 2017-05-13 LAB — COMPREHENSIVE METABOLIC PANEL
ALT: 12 U/L (ref 0–53)
AST: 16 U/L (ref 0–37)
Albumin: 4 g/dL (ref 3.5–5.2)
Alkaline Phosphatase: 66 U/L (ref 39–117)
BUN: 16 mg/dL (ref 6–23)
CO2: 31 mEq/L (ref 19–32)
Calcium: 9 mg/dL (ref 8.4–10.5)
Chloride: 104 mEq/L (ref 96–112)
Creatinine, Ser: 0.78 mg/dL (ref 0.40–1.50)
GFR: 103.93 mL/min (ref >60.00)
Glucose, Bld: 106 mg/dL — ABNORMAL HIGH (ref 70–99)
Potassium: 4.5 mEq/L (ref 3.5–5.1)
Sodium: 140 mEq/L (ref 135–145)
Total Bilirubin: 0.6 mg/dL (ref 0.2–1.2)
Total Protein: 6.8 g/dL (ref 6.0–8.3)

## 2017-05-13 LAB — CBC
HCT: 40.9 % (ref 39.0–52.0)
Hemoglobin: 13.7 g/dL (ref 13.0–17.0)
MCHC: 33.5 g/dL (ref 30.0–36.0)
MCV: 97.8 fl (ref 78.0–100.0)
Platelets: 220 10*3/uL (ref 150.0–400.0)
RBC: 4.19 Mil/uL — ABNORMAL LOW (ref 4.22–5.81)
RDW: 13.8 % (ref 11.5–15.5)
WBC: 7 10*3/uL (ref 4.0–10.5)

## 2017-05-13 LAB — URINALYSIS, ROUTINE W REFLEX MICROSCOPIC
Bilirubin Urine: NEGATIVE
Hgb urine dipstick: NEGATIVE
Ketones, ur: NEGATIVE
Leukocytes, UA: NEGATIVE
Nitrite: NEGATIVE
RBC / HPF: NONE SEEN (ref 0–?)
Specific Gravity, Urine: 1.025 (ref 1.000–1.030)
Total Protein, Urine: NEGATIVE
Urine Glucose: NEGATIVE
Urobilinogen, UA: 0.2 (ref 0.0–1.0)
pH: 6 (ref 5.0–8.0)

## 2017-05-13 LAB — TSH: TSH: 1.34 u[IU]/mL (ref 0.35–4.50)

## 2017-05-13 NOTE — Addendum Note (Signed)
Addended by: Lynnea Ferrier on: 05/13/2017 10:59 AM   Modules accepted: Orders

## 2017-05-26 ENCOUNTER — Ambulatory Visit (INDEPENDENT_AMBULATORY_CARE_PROVIDER_SITE_OTHER): Payer: Medicare Other | Admitting: Family Medicine

## 2017-05-26 ENCOUNTER — Encounter: Payer: Self-pay | Admitting: Family Medicine

## 2017-05-26 VITALS — BP 124/80 | HR 74 | Temp 97.5°F | Ht 73.0 in | Wt 211.1 lb

## 2017-05-26 DIAGNOSIS — N401 Enlarged prostate with lower urinary tract symptoms: Secondary | ICD-10-CM | POA: Insufficient documentation

## 2017-05-26 DIAGNOSIS — Z720 Tobacco use: Secondary | ICD-10-CM

## 2017-05-26 DIAGNOSIS — Z23 Encounter for immunization: Secondary | ICD-10-CM

## 2017-05-26 DIAGNOSIS — R739 Hyperglycemia, unspecified: Secondary | ICD-10-CM | POA: Diagnosis not present

## 2017-05-26 DIAGNOSIS — Z13220 Encounter for screening for lipoid disorders: Secondary | ICD-10-CM

## 2017-05-26 DIAGNOSIS — Z0001 Encounter for general adult medical examination with abnormal findings: Secondary | ICD-10-CM

## 2017-05-26 DIAGNOSIS — Z122 Encounter for screening for malignant neoplasm of respiratory organs: Secondary | ICD-10-CM | POA: Diagnosis not present

## 2017-05-26 DIAGNOSIS — H6123 Impacted cerumen, bilateral: Secondary | ICD-10-CM

## 2017-05-26 LAB — HEMOGLOBIN A1C: Hgb A1c MFr Bld: 5.8 % (ref 4.6–6.5)

## 2017-05-26 LAB — PSA: PSA: 0.76 ng/mL (ref 0.10–4.00)

## 2017-05-26 NOTE — Progress Notes (Signed)
Subjective:  Patient ID: Roger Stewart, male    DOB: February 21, 1946  Age: 71 y.o. MRN: [id number]  CC: Annual Exam   HPI Roger Stewart presents for annual exam.  He is nonfasting today.  He does admit to lots symptoms today.  Symptoms include some urinary frequency and urgency associated with some incontinence.  He is having trouble getting his Voltaren pill.  He asked about a hemoglobin A1c with his elevated glucose.  He is interested in having a flu shot today.  He continues to smoke cigars.  He drinks 2-3 alcoholic beverages a day.  He does use his seatbelt.  He is nonfasting today.  Outpatient Medications Prior to Visit  Medication Sig Dispense Refill  . diclofenac (VOLTAREN) 25 MG EC tablet TAKE 1 TABLET BY MOUTH TWICE DAILY AS NEEDED 180 tablet 0  . diclofenac sodium (VOLTAREN) 1 % GEL Apply 2 g topically 4 (four) times daily. 100 g 1  . lisinopril (PRINIVIL,ZESTRIL) 20 MG tablet TAKE 1 TABLET(20 MG) BY MOUTH DAILY 90 tablet 2  . metoprolol tartrate (LOPRESSOR) 25 MG tablet Take 1 tablet (25 mg total) by mouth daily. 180 tablet 1  . traMADol (ULTRAM) 50 MG tablet Take 1 tablet (50 mg total) by mouth every 8 (eight) hours as needed. 30 tablet 0   No facility-administered medications prior to visit.     ROS Review of Systems  Constitutional: Negative.   HENT: Negative.   Eyes: Negative.   Respiratory: Negative.   Cardiovascular: Negative.   Gastrointestinal: Negative.   Endocrine: Negative for polyphagia and polyuria.  Genitourinary: Positive for frequency and urgency. Negative for hematuria.  Skin: Negative for pallor and wound.  Neurological: Negative for weakness, light-headedness and numbness.  Hematological: Does not bruise/bleed easily.  Psychiatric/Behavioral: Negative for behavioral problems and dysphoric mood.    Objective:  BP 124/80 (BP Location: Right Arm, Patient Position: Sitting, Cuff Size: Normal)   Pulse 74   Temp (!) 97.5 F (36.4 C) (Oral)   Ht 6' 1"  (1.854 m)   Wt 211 lb 2 oz (95.8 kg)   SpO2 96%   BMI 27.85 kg/m   BP Readings from Last 3 Encounters:  05/26/17 124/80  05/12/17 136/80  05/23/16 122/88    Wt Readings from Last 3 Encounters:  05/26/17 211 lb 2 oz (95.8 kg)  05/12/17 211 lb 4 oz (95.8 kg)  05/23/16 211 lb (95.7 kg)    Physical Exam  Constitutional: He is oriented to person, place, and time. He appears well-developed and well-nourished. No distress.  HENT:  Head: Normocephalic and atraumatic.  Right Ear: External ear normal.  Left Ear: External ear normal.  Mouth/Throat: Oropharynx is clear and moist. No oropharyngeal exudate.  Eyes: Conjunctivae are normal. Pupils are equal, round, and reactive to light. Right eye exhibits no discharge. Left eye exhibits no discharge. No scleral icterus.  Neck: Neck supple. No JVD present. No tracheal deviation present. No thyromegaly present.  Cardiovascular: Normal rate, regular rhythm and normal heart sounds.  Pulses:      Carotid pulses are 2+ on the right side, and 2+ on the left side. No carotid bruits.   Pulmonary/Chest: Effort normal and breath sounds normal. No stridor.  Abdominal: Soft. Bowel sounds are normal. He exhibits no distension. There is no tenderness. There is no rebound and no guarding.  Genitourinary: Rectum normal. Rectal exam shows no external hemorrhoid, no internal hemorrhoid, no fissure, no mass, no tenderness, anal tone normal and guaiac negative stool.  Prostate is not enlarged.  Lymphadenopathy:    He has no cervical adenopathy.  Neurological: He is alert and oriented to person, place, and time.  Skin: Skin is warm and dry. He is not diaphoretic.  Psychiatric: He has a normal mood and affect. His behavior is normal.    Lab Results  Component Value Date   WBC 7.0 05/13/2017   HGB 13.7 05/13/2017   HCT 40.9 05/13/2017   PLT 220.0 05/13/2017   GLUCOSE 106 (H) 05/13/2017   CHOL 247 (H) 05/23/2016   TRIG 151.0 (H) 05/23/2016   HDL 72.60  05/23/2016   LDLDIRECT 175.4 05/24/2013   LDLCALC 144 (H) 05/23/2016   ALT 12 05/13/2017   AST 16 05/13/2017   NA 140 05/13/2017   K 4.5 05/13/2017   CL 104 05/13/2017   CREATININE 0.78 05/13/2017   BUN 16 05/13/2017   CO2 31 05/13/2017   TSH 1.34 05/13/2017   PSA 0.66 05/23/2016   HGBA1C 5.7 05/23/2016   MICROALBUR 0.2 09/10/2006    Dg Chest 2 View  Result Date: 05/22/2011 *RADIOLOGY REPORT* Clinical Data: Cough. CHEST - 2 VIEW Comparison: 02/04 and 06/22/2003 Findings: The patient has slight peribronchial thickening consistent with bronchitis.  Heart size and vascularity are normal. No infiltrates or effusions.  No significant osseous abnormality. IMPRESSION: Mild bronchitic changes. Original Report Authenticated By: JAMES H. MAXWELL, M.D.   Assessment & Plan:   Roger Stewart was seen today for annual exam.  Diagnoses and all orders for this visit:  Benign localized prostatic hyperplasia with lower urinary tract symptoms (LUTS) -     PSA  Elevated blood sugar -     Hemoglobin A1c  Excessive cerumen in both ear canals  Screening for hyperlipidemia -     Lipid panel; Future  Tobacco use  Encounter for screening for lung cancer   I am having Roger Stewart maintain his traMADol, diclofenac, metoprolol tartrate, lisinopril, and diclofenac sodium.  No orders of the defined types were placed in this encounter.  He will return fasting for his lipid profile.  Again I strongly encouraged him to stop smoking even though he says he is not inhaling the smoke.  I have ordered a screening low-dose CT of his chest.  He was given a waiver to sign.  He did agree to have the flu shot today.  He is awaiting approval of his Voltaren gel.  I discussed good Rx with him.  Encouraged him to purchase an over-the-counter earwax removal kit and use it.  Follow-up: No Follow-up on file.   Alfred , MD  

## 2017-05-26 NOTE — Addendum Note (Signed)
Addended by: Jon Billings on: 05/26/2017 04:47 PM   Modules accepted: Orders

## 2017-05-27 ENCOUNTER — Other Ambulatory Visit (INDEPENDENT_AMBULATORY_CARE_PROVIDER_SITE_OTHER): Payer: Medicare Other

## 2017-05-27 DIAGNOSIS — Z13220 Encounter for screening for lipoid disorders: Secondary | ICD-10-CM

## 2017-05-27 LAB — LIPID PANEL
Cholesterol: 202 mg/dL — ABNORMAL HIGH (ref 0–200)
HDL: 60.7 mg/dL (ref >39.00)
LDL Cholesterol: 114 mg/dL — ABNORMAL HIGH (ref 0–99)
NonHDL: 141.69
Total CHOL/HDL Ratio: 3
Triglycerides: 138 mg/dL (ref 0.0–149.0)
VLDL: 27.6 mg/dL (ref 0.0–40.0)

## 2017-06-20 ENCOUNTER — Other Ambulatory Visit: Payer: Self-pay | Admitting: Family Medicine

## 2017-06-20 DIAGNOSIS — I10 Essential (primary) hypertension: Secondary | ICD-10-CM

## 2017-07-04 ENCOUNTER — Telehealth: Payer: Self-pay | Admitting: Family Medicine

## 2017-07-04 NOTE — Telephone Encounter (Signed)
Spoke with Roger Stewart regarding AWV. Patient stated that he is out of town and will be returning in the summer of 2019. He stated that he will give office a call when he returns to schedule an appt. SF

## 2017-07-24 DIAGNOSIS — D485 Neoplasm of uncertain behavior of skin: Secondary | ICD-10-CM | POA: Diagnosis not present

## 2017-07-24 DIAGNOSIS — Z85828 Personal history of other malignant neoplasm of skin: Secondary | ICD-10-CM | POA: Diagnosis not present

## 2017-07-24 DIAGNOSIS — Z08 Encounter for follow-up examination after completed treatment for malignant neoplasm: Secondary | ICD-10-CM | POA: Diagnosis not present

## 2017-07-24 DIAGNOSIS — C44311 Basal cell carcinoma of skin of nose: Secondary | ICD-10-CM | POA: Diagnosis not present

## 2017-09-22 DIAGNOSIS — C44311 Basal cell carcinoma of skin of nose: Secondary | ICD-10-CM | POA: Diagnosis not present

## 2017-09-23 DIAGNOSIS — C44311 Basal cell carcinoma of skin of nose: Secondary | ICD-10-CM | POA: Diagnosis not present

## 2017-09-26 DIAGNOSIS — C44311 Basal cell carcinoma of skin of nose: Secondary | ICD-10-CM | POA: Diagnosis not present

## 2017-10-03 DIAGNOSIS — F17210 Nicotine dependence, cigarettes, uncomplicated: Secondary | ICD-10-CM | POA: Diagnosis not present

## 2017-10-03 DIAGNOSIS — M25572 Pain in left ankle and joints of left foot: Secondary | ICD-10-CM | POA: Diagnosis not present

## 2017-10-03 DIAGNOSIS — E7800 Pure hypercholesterolemia, unspecified: Secondary | ICD-10-CM | POA: Diagnosis not present

## 2017-10-03 DIAGNOSIS — I10 Essential (primary) hypertension: Secondary | ICD-10-CM | POA: Diagnosis not present

## 2017-10-03 DIAGNOSIS — Z79899 Other long term (current) drug therapy: Secondary | ICD-10-CM | POA: Diagnosis not present

## 2017-10-03 DIAGNOSIS — Z5321 Procedure and treatment not carried out due to patient leaving prior to being seen by health care provider: Secondary | ICD-10-CM | POA: Diagnosis not present

## 2017-10-03 DIAGNOSIS — M25472 Effusion, left ankle: Secondary | ICD-10-CM | POA: Diagnosis not present

## 2017-10-03 DIAGNOSIS — R2242 Localized swelling, mass and lump, left lower limb: Secondary | ICD-10-CM | POA: Diagnosis not present

## 2017-10-03 DIAGNOSIS — M25872 Other specified joint disorders, left ankle and foot: Secondary | ICD-10-CM | POA: Diagnosis not present

## 2017-10-03 DIAGNOSIS — M19072 Primary osteoarthritis, left ankle and foot: Secondary | ICD-10-CM | POA: Diagnosis not present

## 2017-10-23 DIAGNOSIS — L578 Other skin changes due to chronic exposure to nonionizing radiation: Secondary | ICD-10-CM | POA: Diagnosis not present

## 2017-10-23 DIAGNOSIS — C44311 Basal cell carcinoma of skin of nose: Secondary | ICD-10-CM | POA: Diagnosis not present

## 2017-10-23 DIAGNOSIS — Z85828 Personal history of other malignant neoplasm of skin: Secondary | ICD-10-CM | POA: Diagnosis not present

## 2017-10-23 DIAGNOSIS — Z08 Encounter for follow-up examination after completed treatment for malignant neoplasm: Secondary | ICD-10-CM | POA: Diagnosis not present

## 2017-10-23 DIAGNOSIS — R239 Unspecified skin changes: Secondary | ICD-10-CM | POA: Diagnosis not present

## 2017-10-23 DIAGNOSIS — C441122 Basal cell carcinoma of skin of right lower eyelid, including canthus: Secondary | ICD-10-CM | POA: Diagnosis not present

## 2017-10-23 DIAGNOSIS — D485 Neoplasm of uncertain behavior of skin: Secondary | ICD-10-CM | POA: Diagnosis not present

## 2017-10-23 DIAGNOSIS — X32XXXA Exposure to sunlight, initial encounter: Secondary | ICD-10-CM | POA: Diagnosis not present

## 2017-11-10 DIAGNOSIS — C441122 Basal cell carcinoma of skin of right lower eyelid, including canthus: Secondary | ICD-10-CM | POA: Diagnosis not present

## 2017-11-11 DIAGNOSIS — C441122 Basal cell carcinoma of skin of right lower eyelid, including canthus: Secondary | ICD-10-CM | POA: Diagnosis not present

## 2017-11-12 DIAGNOSIS — C44311 Basal cell carcinoma of skin of nose: Secondary | ICD-10-CM | POA: Diagnosis not present

## 2017-12-18 ENCOUNTER — Other Ambulatory Visit: Payer: Self-pay | Admitting: Family Medicine

## 2017-12-18 DIAGNOSIS — I10 Essential (primary) hypertension: Secondary | ICD-10-CM

## 2018-03-05 DIAGNOSIS — M9904 Segmental and somatic dysfunction of sacral region: Secondary | ICD-10-CM | POA: Diagnosis not present

## 2018-03-05 DIAGNOSIS — M9903 Segmental and somatic dysfunction of lumbar region: Secondary | ICD-10-CM | POA: Diagnosis not present

## 2018-03-05 DIAGNOSIS — M9902 Segmental and somatic dysfunction of thoracic region: Secondary | ICD-10-CM | POA: Diagnosis not present

## 2018-03-05 DIAGNOSIS — M5135 Other intervertebral disc degeneration, thoracolumbar region: Secondary | ICD-10-CM | POA: Diagnosis not present

## 2018-03-05 DIAGNOSIS — M5136 Other intervertebral disc degeneration, lumbar region: Secondary | ICD-10-CM | POA: Diagnosis not present

## 2018-03-05 DIAGNOSIS — M5137 Other intervertebral disc degeneration, lumbosacral region: Secondary | ICD-10-CM | POA: Diagnosis not present

## 2018-04-01 DIAGNOSIS — X32XXXA Exposure to sunlight, initial encounter: Secondary | ICD-10-CM | POA: Diagnosis not present

## 2018-04-01 DIAGNOSIS — L821 Other seborrheic keratosis: Secondary | ICD-10-CM | POA: Diagnosis not present

## 2018-04-01 DIAGNOSIS — D1801 Hemangioma of skin and subcutaneous tissue: Secondary | ICD-10-CM | POA: Diagnosis not present

## 2018-04-01 DIAGNOSIS — L570 Actinic keratosis: Secondary | ICD-10-CM | POA: Diagnosis not present

## 2018-04-01 DIAGNOSIS — L578 Other skin changes due to chronic exposure to nonionizing radiation: Secondary | ICD-10-CM | POA: Diagnosis not present

## 2018-05-18 ENCOUNTER — Other Ambulatory Visit: Payer: Self-pay

## 2018-05-18 DIAGNOSIS — I10 Essential (primary) hypertension: Secondary | ICD-10-CM

## 2018-05-18 DIAGNOSIS — E7849 Other hyperlipidemia: Secondary | ICD-10-CM

## 2018-05-18 DIAGNOSIS — N401 Enlarged prostate with lower urinary tract symptoms: Secondary | ICD-10-CM

## 2018-05-18 DIAGNOSIS — R739 Hyperglycemia, unspecified: Secondary | ICD-10-CM

## 2018-05-27 ENCOUNTER — Encounter: Payer: Self-pay | Admitting: Family Medicine

## 2018-05-27 ENCOUNTER — Ambulatory Visit (INDEPENDENT_AMBULATORY_CARE_PROVIDER_SITE_OTHER): Payer: Medicare Other | Admitting: Family Medicine

## 2018-05-27 ENCOUNTER — Other Ambulatory Visit (INDEPENDENT_AMBULATORY_CARE_PROVIDER_SITE_OTHER): Payer: Medicare Other

## 2018-05-27 VITALS — BP 136/78 | HR 78 | Temp 98.9°F | Ht 73.0 in | Wt 211.0 lb

## 2018-05-27 DIAGNOSIS — Z720 Tobacco use: Secondary | ICD-10-CM

## 2018-05-27 DIAGNOSIS — E7849 Other hyperlipidemia: Secondary | ICD-10-CM | POA: Diagnosis not present

## 2018-05-27 DIAGNOSIS — N401 Enlarged prostate with lower urinary tract symptoms: Secondary | ICD-10-CM | POA: Diagnosis not present

## 2018-05-27 DIAGNOSIS — R739 Hyperglycemia, unspecified: Secondary | ICD-10-CM

## 2018-05-27 DIAGNOSIS — I10 Essential (primary) hypertension: Secondary | ICD-10-CM

## 2018-05-27 DIAGNOSIS — M5416 Radiculopathy, lumbar region: Secondary | ICD-10-CM | POA: Diagnosis not present

## 2018-05-27 DIAGNOSIS — E7800 Pure hypercholesterolemia, unspecified: Secondary | ICD-10-CM

## 2018-05-27 LAB — LIPID PANEL
Cholesterol: 222 mg/dL — ABNORMAL HIGH (ref 0–200)
HDL: 66.8 mg/dL (ref >39.00)
LDL Cholesterol: 134 mg/dL — ABNORMAL HIGH (ref 0–99)
NonHDL: 155.38
Total CHOL/HDL Ratio: 3
Triglycerides: 108 mg/dL (ref 0.0–149.0)
VLDL: 21.6 mg/dL (ref 0.0–40.0)

## 2018-05-27 LAB — URINALYSIS, ROUTINE W REFLEX MICROSCOPIC
Hgb urine dipstick: NEGATIVE
Ketones, ur: NEGATIVE
Leukocytes, UA: NEGATIVE
Nitrite: NEGATIVE
RBC / HPF: NONE SEEN (ref 0–?)
Specific Gravity, Urine: 1.015 (ref 1.000–1.030)
Total Protein, Urine: NEGATIVE
Urine Glucose: NEGATIVE
Urobilinogen, UA: 0.2 (ref 0.0–1.0)
pH: 6 (ref 5.0–8.0)

## 2018-05-27 LAB — COMPREHENSIVE METABOLIC PANEL
ALT: 12 U/L (ref 0–53)
AST: 15 U/L (ref 0–37)
Albumin: 4 g/dL (ref 3.5–5.2)
Alkaline Phosphatase: 71 U/L (ref 39–117)
BUN: 14 mg/dL (ref 6–23)
CO2: 30 mEq/L (ref 19–32)
Calcium: 9.3 mg/dL (ref 8.4–10.5)
Chloride: 100 mEq/L (ref 96–112)
Creatinine, Ser: 0.84 mg/dL (ref 0.40–1.50)
GFR: 95.14 mL/min (ref >60.00)
Glucose, Bld: 104 mg/dL — ABNORMAL HIGH (ref 70–99)
Potassium: 5 mEq/L (ref 3.5–5.1)
Sodium: 138 mEq/L (ref 135–145)
Total Bilirubin: 0.6 mg/dL (ref 0.2–1.2)
Total Protein: 7 g/dL (ref 6.0–8.3)

## 2018-05-27 LAB — CBC
HCT: 40.8 % (ref 39.0–52.0)
Hemoglobin: 13.8 g/dL (ref 13.0–17.0)
MCHC: 33.8 g/dL (ref 30.0–36.0)
MCV: 97 fl (ref 78.0–100.0)
Platelets: 249 10*3/uL (ref 150.0–400.0)
RBC: 4.2 Mil/uL — ABNORMAL LOW (ref 4.22–5.81)
RDW: 13.6 % (ref 11.5–15.5)
WBC: 6.8 10*3/uL (ref 4.0–10.5)

## 2018-05-27 LAB — TSH: TSH: 1.86 u[IU]/mL (ref 0.35–4.50)

## 2018-05-27 LAB — PSA: PSA: 0.59 ng/mL (ref 0.10–4.00)

## 2018-05-27 LAB — HEMOGLOBIN A1C: Hgb A1c MFr Bld: 6 % (ref 4.6–6.5)

## 2018-05-27 MED ORDER — LISINOPRIL 20 MG PO TABS: ORAL_TABLET | ORAL | 1 refills | Status: DC

## 2018-05-27 MED ORDER — TRAMADOL HCL 50 MG PO TABS: 50.0000 mg | ORAL_TABLET | Freq: Three times a day (TID) | ORAL | 0 refills | Status: DC | PRN

## 2018-05-27 MED ORDER — METOPROLOL TARTRATE 25 MG PO TABS: 25.0000 mg | ORAL_TABLET | Freq: Two times a day (BID) | ORAL | 1 refills | Status: DC

## 2018-05-27 MED ORDER — DICLOFENAC SODIUM 25 MG PO TBEC: 25.0000 mg | DELAYED_RELEASE_TABLET | Freq: Two times a day (BID) | ORAL | 0 refills | Status: DC | PRN

## 2018-05-27 NOTE — Patient Instructions (Signed)
Health Maintenance After Age 73 After age 73, you are at a higher risk for certain long-term diseases and infections as well as injuries from falls. Falls are a major cause of broken bones and head injuries in people who are older than age 73. Getting regular preventive care can help to keep you healthy and well. Preventive care includes getting regular testing and making lifestyle changes as recommended by your health care provider. Talk with your health care provider about:  Which screenings and tests you should have. A screening is a test that checks for a disease when you have no symptoms.  A diet and exercise plan that is right for you. What should I know about screenings and tests to prevent falls? Screening and testing are the best ways to find a health problem early. Early diagnosis and treatment give you the best chance of managing medical conditions that are common after age 73. Certain conditions and lifestyle choices may make you more likely to have a fall. Your health care provider may recommend:  Regular vision checks. Poor vision and conditions such as cataracts can make you more likely to have a fall. If you wear glasses, make sure to get your prescription updated if your vision changes.  Medicine review. Work with your health care provider to regularly review all of the medicines you are taking, including over-the-counter medicines. Ask your health care provider about any side effects that may make you more likely to have a fall. Tell your health care provider if any medicines that you take make you feel dizzy or sleepy.  Osteoporosis screening. Osteoporosis is a condition that causes the bones to get weaker. This can make the bones weak and cause them to break more easily.  Blood pressure screening. Blood pressure changes and medicines to control blood pressure can make you feel dizzy.  Strength and balance checks. Your health care provider may recommend certain tests to check your  strength and balance while standing, walking, or changing positions.  Foot health exam. Foot pain and numbness, as well as not wearing proper footwear, can make you more likely to have a fall.  Depression screening. You may be more likely to have a fall if you have a fear of falling, feel emotionally low, or feel unable to do activities that you used to do.  Alcohol use screening. Using too much alcohol can affect your balance and may make you more likely to have a fall. What actions can I take to lower my risk of falls? General instructions  Talk with your health care provider about your risks for falling. Tell your health care provider if: ? You fall. Be sure to tell your health care provider about all falls, even ones that seem minor. ? You feel dizzy, sleepy, or off-balance.  Take over-the-counter and prescription medicines only as told by your health care provider. These include any supplements.  Eat a healthy diet and maintain a healthy weight. A healthy diet includes low-fat dairy products, low-fat (lean) meats, and fiber from whole grains, beans, and lots of fruits and vegetables. Home safety  Remove any tripping hazards, such as rugs, cords, and clutter.  Install safety equipment such as grab bars in bathrooms and safety rails on stairs.  Keep rooms and walkways well-lit. Activity   Follow a regular exercise program to stay fit. This will help you maintain your balance. Ask your health care provider what types of exercise are appropriate for you.  If you need a cane or   walker, use it as recommended by your health care provider.  Wear supportive shoes that have nonskid soles. Lifestyle  Do not drink alcohol if your health care provider tells you not to drink.  If you drink alcohol, limit how much you have: ? 0-1 drink a day for women. ? 0-2 drinks a day for men.  Be aware of how much alcohol is in your drink. In the [place] one drink equals one typical bottle of beer (12  oz), one-half glass of wine (5 oz), or one shot of hard liquor (1 oz).  Do not use any products that contain nicotine or tobacco, such as cigarettes and e-cigarettes. If you need help quitting, ask your health care provider. Summary  Having a healthy lifestyle and getting preventive care can help to protect your health and wellness after age 73.  Screening and testing are the best way to find a health problem early and help you avoid having a fall. Early diagnosis and treatment give you the best chance for managing medical conditions that are more common for people who are older than age 73.  Falls are a major cause of broken bones and head injuries in people who are older than age 73. Take precautions to prevent a fall at home.  Work with your health care provider to learn what changes you can make to improve your health and wellness and to prevent falls. This information is not intended to replace advice given to you by your health care provider. Make sure you discuss any questions you have with your health care provider. Document Released: 04/09/2017 Document Revised: 04/09/2017 Document Reviewed: 04/09/2017 Elsevier Interactive Patient Education  2019 [employer]  

## 2018-05-27 NOTE — Progress Notes (Addendum)
Subjective:  Patient ID: Roger Stewart, male    DOB: 1945/10/09  Age: 72 y.o. MRN: [id number]  CC: Annual Exam (Has been experiencing reflux)   HPI Roger Stewart presents for physical exam and follow-up of his hypertension that is been well controlled with the lisinopril and metoprolol.  He has chronic lower back pain associated with 2 microdiscectomies and lingering sciatica.  He sees a Restaurant manager, fast food in [place] and does exercises.  He uses Voltaren and Ultram as needed.  Urine flow is mostly good but he experiences occasional hesitancy.  He will leave soon for [place] and be back in late May.  He can sit continues to smoke cigars on occasion while he is playing golf.  He averages 2 alcoholic drinks nightly.  He walks 2-3 times a week and works out and is home gym.  Outpatient Medications Prior to Visit  Medication Sig Dispense Refill  . diclofenac sodium (VOLTAREN) 1 % GEL Apply 2 g topically 4 (four) times daily. 100 g 1  . diclofenac (VOLTAREN) 25 MG EC tablet TAKE 1 TABLET BY MOUTH TWICE DAILY AS NEEDED 180 tablet 0  . lisinopril (PRINIVIL,ZESTRIL) 20 MG tablet TAKE 1 TABLET(20 MG) BY MOUTH DAILY 90 tablet 1  . metoprolol tartrate (LOPRESSOR) 25 MG tablet TAKE 1 TABLET BY MOUTH DAILY 180 tablet 1  . traMADol (ULTRAM) 50 MG tablet Take 1 tablet (50 mg total) by mouth every 8 (eight) hours as needed. 30 tablet 0   No facility-administered medications prior to visit.     ROS Review of Systems  Constitutional: Negative for diaphoresis, fatigue, fever and unexpected weight change.  HENT: Negative.   Eyes: Negative for photophobia and visual disturbance.  Respiratory: Negative.   Cardiovascular: Negative.   Gastrointestinal: Negative.   Endocrine: Negative for polyphagia.  Genitourinary: Negative.   Musculoskeletal: Positive for back pain.  Allergic/Immunologic: Negative for immunocompromised state.  Neurological: Negative.  Negative for tremors, weakness and numbness.    Hematological: Negative.   Psychiatric/Behavioral: Negative for decreased concentration.    Objective:  BP 136/78   Pulse 78   Temp 98.9 F (37.2 C) (Oral)   Ht 6\' 1"  (1.856 m)   Wt 211 lb (95.7 kg)   SpO2 95%   BMI 27.84 kg/m   BP Readings from Last 3 Encounters:  05/27/18 136/78  05/26/17 124/80  05/12/17 136/80    Wt Readings from Last 3 Encounters:  05/27/18 211 lb (95.7 kg)  05/26/17 211 lb 2 oz (95.8 kg)  05/12/17 211 lb 4 oz (95.8 kg)    Physical Exam Constitutional:      General: He is not in acute distress.    Appearance: Normal appearance. He is normal weight. He is not ill-appearing, toxic-appearing or diaphoretic.  HENT:     Head: Atraumatic.     Right Ear: Tympanic membrane normal.     Left Ear: Tympanic membrane normal.     Nose: Nose normal.     Mouth/Throat:     Mouth: Mucous membranes are moist.     Pharynx: Oropharynx is clear. No oropharyngeal exudate or posterior oropharyngeal erythema.  Eyes:     General: No scleral icterus.       Right eye: No discharge.        Left eye: No discharge.     Conjunctiva/sclera: Conjunctivae normal.  Neck:     Musculoskeletal: Normal range of motion and neck supple. No neck rigidity or muscular tenderness.  Cardiovascular:     Rate and  Rhythm: Regular rhythm.     Pulses: Normal pulses.  Pulmonary:     Effort: Pulmonary effort is normal. No respiratory distress.     Breath sounds: Normal breath sounds. No stridor. No wheezing or rhonchi.  Abdominal:     General: Bowel sounds are normal. There is no distension.     Palpations: Abdomen is soft. There is no mass.     Tenderness: There is no abdominal tenderness. There is no guarding or rebound.     Hernia: No hernia is present.  Genitourinary:    Prostate: Enlarged. Not tender and no nodules present.     Rectum: Guaiac result negative. No mass, tenderness, anal fissure, external hemorrhoid or internal hemorrhoid. Normal anal tone.  Lymphadenopathy:      Cervical: No cervical adenopathy.  Skin:    General: Skin is warm and dry.  Neurological:     General: No focal deficit present.     Mental Status: He is alert and oriented to person, place, and time.  Psychiatric:        Mood and Affect: Mood normal.        Thought Content: Thought content normal.     Lab Results  Component Value Date   WBC 6.8 05/27/2018   HGB 13.8 05/27/2018   HCT 40.8 05/27/2018   PLT 249.0 05/27/2018   GLUCOSE 104 (H) 05/27/2018   CHOL 222 (H) 05/27/2018   TRIG 108.0 05/27/2018   HDL 66.80 05/27/2018   LDLDIRECT 175.4 05/24/2013   LDLCALC 134 (H) 05/27/2018   ALT 12 05/27/2018   AST 15 05/27/2018   NA 138 05/27/2018   K 5.0 05/27/2018   CL 100 05/27/2018   CREATININE 0.84 05/27/2018   BUN 14 05/27/2018   CO2 30 05/27/2018   TSH 1.86 05/27/2018   PSA 0.59 05/27/2018   HGBA1C 6.0 05/27/2018   MICROALBUR 0.2 09/10/2006    Dg Chest 2 View  Result Date: 05/22/2011 *RADIOLOGY REPORT* Clinical Data: Cough. CHEST - 2 VIEW Comparison: 02/04 and 06/22/2003 Findings: The patient has slight peribronchial thickening consistent with bronchitis.  Heart size and vascularity are normal. No infiltrates or effusions.  No significant osseous abnormality. IMPRESSION: Mild bronchitic changes. Original Report Authenticated By: Larey Seat, M.D. The 10-year ASCVD risk score Mikey Bussing DC Brooke Bonito., et al., 2013) is: 29.2%   Values used to calculate the score:     Age: 36 years     Sex: Male     Is Non-Hispanic African American: No     Diabetic: No     Tobacco smoker: Yes     Systolic Blood Pressure: 580 mmHg     Is BP treated: Yes     HDL Cholesterol: 66.8 mg/dL     Total Cholesterol: 222 mg/dL  Assessment & Plan:   Roger Stewart was seen today for annual exam.  Diagnoses and all orders for this visit:  Essential hypertension -     metoprolol tartrate (LOPRESSOR) 25 MG tablet; Take 1 tablet (25 mg total) by mouth 2 (two) times daily. -     lisinopril (PRINIVIL,ZESTRIL)  20 MG tablet; TAKE 1 TABLET(20 MG) BY MOUTH DAILY  Tobacco use  Elevated blood sugar  Benign localized prostatic hyperplasia with lower urinary tract symptoms (LUTS)  Lumbar radiculopathy -     traMADol (ULTRAM) 50 MG tablet; Take 1 tablet (50 mg total) by mouth every 8 (eight) hours as needed. -     diclofenac (VOLTAREN) 25 MG EC tablet; Take 1 tablet (25 mg  total) by mouth 2 (two) times daily as needed.  Elevated LDL cholesterol level -     atorvastatin (LIPITOR) 20 MG tablet; Take 1 tablet (20 mg total) by mouth daily.   I have changed Delois G. Browe's metoprolol tartrate and diclofenac. I am also having him start on atorvastatin. Additionally, I am having him maintain his diclofenac sodium, lisinopril, and traMADol.  Meds ordered this encounter  Medications  . metoprolol tartrate (LOPRESSOR) 25 MG tablet    Sig: Take 1 tablet (25 mg total) by mouth 2 (two) times daily.    Dispense:  180 tablet    Refill:  1  . lisinopril (PRINIVIL,ZESTRIL) 20 MG tablet    Sig: TAKE 1 TABLET(20 MG) BY MOUTH DAILY    Dispense:  90 tablet    Refill:  1  . traMADol (ULTRAM) 50 MG tablet    Sig: Take 1 tablet (50 mg total) by mouth every 8 (eight) hours as needed.    Dispense:  30 tablet    Refill:  0  . diclofenac (VOLTAREN) 25 MG EC tablet    Sig: Take 1 tablet (25 mg total) by mouth 2 (two) times daily as needed.    Dispense:  180 tablet    Refill:  0  . atorvastatin (LIPITOR) 20 MG tablet    Sig: Take 1 tablet (20 mg total) by mouth daily.    Dispense:  90 tablet    Refill:  3     Follow-up: Return in about 6 months (around 11/26/2018).  Libby Maw, MD   Patient encouraged to take medicines as directed.  Encouraged him to seek at least 30 minutes of exercise 5 days a week.  Promised him that he would live longer giving out cigars.  Patient was given information on health maintenance and disease prevention.

## 2018-05-29 DIAGNOSIS — E7800 Pure hypercholesterolemia, unspecified: Secondary | ICD-10-CM | POA: Insufficient documentation

## 2018-05-29 MED ORDER — ATORVASTATIN CALCIUM 20 MG PO TABS: 20.0000 mg | ORAL_TABLET | Freq: Every day | ORAL | 3 refills | Status: DC

## 2018-05-29 NOTE — Addendum Note (Signed)
Addended by: Jon Billings on: 05/29/2018 04:54 PM   Modules accepted: Orders

## 2018-06-08 DIAGNOSIS — M5136 Other intervertebral disc degeneration, lumbar region: Secondary | ICD-10-CM | POA: Diagnosis not present

## 2018-06-08 DIAGNOSIS — M5135 Other intervertebral disc degeneration, thoracolumbar region: Secondary | ICD-10-CM | POA: Diagnosis not present

## 2018-06-08 DIAGNOSIS — M5137 Other intervertebral disc degeneration, lumbosacral region: Secondary | ICD-10-CM | POA: Diagnosis not present

## 2018-06-08 DIAGNOSIS — M9904 Segmental and somatic dysfunction of sacral region: Secondary | ICD-10-CM | POA: Diagnosis not present

## 2018-06-08 DIAGNOSIS — M9903 Segmental and somatic dysfunction of lumbar region: Secondary | ICD-10-CM | POA: Diagnosis not present

## 2018-06-08 DIAGNOSIS — M9902 Segmental and somatic dysfunction of thoracic region: Secondary | ICD-10-CM | POA: Diagnosis not present

## 2018-09-02 DIAGNOSIS — M62830 Muscle spasm of back: Secondary | ICD-10-CM | POA: Diagnosis not present

## 2018-09-02 DIAGNOSIS — M5417 Radiculopathy, lumbosacral region: Secondary | ICD-10-CM | POA: Diagnosis not present

## 2018-09-02 DIAGNOSIS — M545 Low back pain: Secondary | ICD-10-CM | POA: Diagnosis not present

## 2018-09-02 DIAGNOSIS — M47897 Other spondylosis, lumbosacral region: Secondary | ICD-10-CM | POA: Diagnosis not present

## 2018-09-02 DIAGNOSIS — M9904 Segmental and somatic dysfunction of sacral region: Secondary | ICD-10-CM | POA: Diagnosis not present

## 2018-09-02 DIAGNOSIS — M9903 Segmental and somatic dysfunction of lumbar region: Secondary | ICD-10-CM | POA: Diagnosis not present

## 2018-09-02 DIAGNOSIS — M9905 Segmental and somatic dysfunction of pelvic region: Secondary | ICD-10-CM | POA: Diagnosis not present

## 2018-09-07 DIAGNOSIS — M62830 Muscle spasm of back: Secondary | ICD-10-CM | POA: Diagnosis not present

## 2018-09-07 DIAGNOSIS — M9905 Segmental and somatic dysfunction of pelvic region: Secondary | ICD-10-CM | POA: Diagnosis not present

## 2018-09-07 DIAGNOSIS — M9904 Segmental and somatic dysfunction of sacral region: Secondary | ICD-10-CM | POA: Diagnosis not present

## 2018-09-07 DIAGNOSIS — M47897 Other spondylosis, lumbosacral region: Secondary | ICD-10-CM | POA: Diagnosis not present

## 2018-09-07 DIAGNOSIS — M545 Low back pain: Secondary | ICD-10-CM | POA: Diagnosis not present

## 2018-09-07 DIAGNOSIS — M5417 Radiculopathy, lumbosacral region: Secondary | ICD-10-CM | POA: Diagnosis not present

## 2018-09-07 DIAGNOSIS — M9903 Segmental and somatic dysfunction of lumbar region: Secondary | ICD-10-CM | POA: Diagnosis not present

## 2018-09-09 DIAGNOSIS — M545 Low back pain: Secondary | ICD-10-CM | POA: Diagnosis not present

## 2018-09-09 DIAGNOSIS — F17200 Nicotine dependence, unspecified, uncomplicated: Secondary | ICD-10-CM | POA: Diagnosis not present

## 2018-09-09 DIAGNOSIS — M62830 Muscle spasm of back: Secondary | ICD-10-CM | POA: Diagnosis not present

## 2018-09-09 DIAGNOSIS — M109 Gout, unspecified: Secondary | ICD-10-CM | POA: Diagnosis not present

## 2018-09-09 DIAGNOSIS — M25572 Pain in left ankle and joints of left foot: Secondary | ICD-10-CM | POA: Diagnosis not present

## 2018-09-09 DIAGNOSIS — M9904 Segmental and somatic dysfunction of sacral region: Secondary | ICD-10-CM | POA: Diagnosis not present

## 2018-09-09 DIAGNOSIS — M5417 Radiculopathy, lumbosacral region: Secondary | ICD-10-CM | POA: Diagnosis not present

## 2018-09-09 DIAGNOSIS — Z79899 Other long term (current) drug therapy: Secondary | ICD-10-CM | POA: Diagnosis not present

## 2018-09-09 DIAGNOSIS — R600 Localized edema: Secondary | ICD-10-CM | POA: Diagnosis not present

## 2018-09-09 DIAGNOSIS — M9905 Segmental and somatic dysfunction of pelvic region: Secondary | ICD-10-CM | POA: Diagnosis not present

## 2018-09-09 DIAGNOSIS — Z7984 Long term (current) use of oral hypoglycemic drugs: Secondary | ICD-10-CM | POA: Diagnosis not present

## 2018-09-09 DIAGNOSIS — M9903 Segmental and somatic dysfunction of lumbar region: Secondary | ICD-10-CM | POA: Diagnosis not present

## 2018-09-09 DIAGNOSIS — Z8679 Personal history of other diseases of the circulatory system: Secondary | ICD-10-CM | POA: Diagnosis not present

## 2018-09-09 DIAGNOSIS — Z79890 Hormone replacement therapy (postmenopausal): Secondary | ICD-10-CM | POA: Diagnosis not present

## 2018-09-09 DIAGNOSIS — M47897 Other spondylosis, lumbosacral region: Secondary | ICD-10-CM | POA: Diagnosis not present

## 2018-09-11 ENCOUNTER — Encounter: Payer: Self-pay | Admitting: Family Medicine

## 2018-09-14 NOTE — Telephone Encounter (Signed)
  Patient had called out of concern about his metoprolol tartrate.  He is taking it daily only for years.  Recently I increased it to twice daily that he only recently realized after filling the prescription back in December.  His blood pressure has been running consistently in the 120s over 80s.  Taking the Lopressor once daily.  He has no history of tachycardia, atrial fib or myocardial infarction.  He also asks about whether or not he should stay on an aspirin.  He has no known blockages or vascular disease.  He does smoke.  He does bleed freely while taking the aspirin.  For example small cuts bleed excessively it seems.  Patient will taper off of the metoprolol over a 3 to 4-week period.  He will check his blood pressures and let me know.  He may not need this medication at all.

## 2018-10-01 DIAGNOSIS — S161XXA Strain of muscle, fascia and tendon at neck level, initial encounter: Secondary | ICD-10-CM | POA: Diagnosis not present

## 2018-10-01 DIAGNOSIS — M9901 Segmental and somatic dysfunction of cervical region: Secondary | ICD-10-CM | POA: Diagnosis not present

## 2018-10-01 DIAGNOSIS — M9904 Segmental and somatic dysfunction of sacral region: Secondary | ICD-10-CM | POA: Diagnosis not present

## 2018-10-01 DIAGNOSIS — M47817 Spondylosis without myelopathy or radiculopathy, lumbosacral region: Secondary | ICD-10-CM | POA: Diagnosis not present

## 2018-10-01 DIAGNOSIS — M5432 Sciatica, left side: Secondary | ICD-10-CM | POA: Diagnosis not present

## 2018-10-01 DIAGNOSIS — M9903 Segmental and somatic dysfunction of lumbar region: Secondary | ICD-10-CM | POA: Diagnosis not present

## 2018-10-01 DIAGNOSIS — M9902 Segmental and somatic dysfunction of thoracic region: Secondary | ICD-10-CM | POA: Diagnosis not present

## 2018-10-01 DIAGNOSIS — M546 Pain in thoracic spine: Secondary | ICD-10-CM | POA: Diagnosis not present

## 2018-10-02 DIAGNOSIS — M47817 Spondylosis without myelopathy or radiculopathy, lumbosacral region: Secondary | ICD-10-CM | POA: Diagnosis not present

## 2018-10-02 DIAGNOSIS — M9902 Segmental and somatic dysfunction of thoracic region: Secondary | ICD-10-CM | POA: Diagnosis not present

## 2018-10-02 DIAGNOSIS — S161XXA Strain of muscle, fascia and tendon at neck level, initial encounter: Secondary | ICD-10-CM | POA: Diagnosis not present

## 2018-10-02 DIAGNOSIS — M546 Pain in thoracic spine: Secondary | ICD-10-CM | POA: Diagnosis not present

## 2018-10-02 DIAGNOSIS — M9903 Segmental and somatic dysfunction of lumbar region: Secondary | ICD-10-CM | POA: Diagnosis not present

## 2018-10-02 DIAGNOSIS — M9901 Segmental and somatic dysfunction of cervical region: Secondary | ICD-10-CM | POA: Diagnosis not present

## 2018-10-02 DIAGNOSIS — M5432 Sciatica, left side: Secondary | ICD-10-CM | POA: Diagnosis not present

## 2018-10-02 DIAGNOSIS — M9904 Segmental and somatic dysfunction of sacral region: Secondary | ICD-10-CM | POA: Diagnosis not present

## 2018-10-05 DIAGNOSIS — S161XXA Strain of muscle, fascia and tendon at neck level, initial encounter: Secondary | ICD-10-CM | POA: Diagnosis not present

## 2018-10-05 DIAGNOSIS — M5432 Sciatica, left side: Secondary | ICD-10-CM | POA: Diagnosis not present

## 2018-10-05 DIAGNOSIS — M9902 Segmental and somatic dysfunction of thoracic region: Secondary | ICD-10-CM | POA: Diagnosis not present

## 2018-10-05 DIAGNOSIS — M9901 Segmental and somatic dysfunction of cervical region: Secondary | ICD-10-CM | POA: Diagnosis not present

## 2018-10-05 DIAGNOSIS — M546 Pain in thoracic spine: Secondary | ICD-10-CM | POA: Diagnosis not present

## 2018-10-05 DIAGNOSIS — M9903 Segmental and somatic dysfunction of lumbar region: Secondary | ICD-10-CM | POA: Diagnosis not present

## 2018-10-05 DIAGNOSIS — M47817 Spondylosis without myelopathy or radiculopathy, lumbosacral region: Secondary | ICD-10-CM | POA: Diagnosis not present

## 2018-10-05 DIAGNOSIS — M9904 Segmental and somatic dysfunction of sacral region: Secondary | ICD-10-CM | POA: Diagnosis not present

## 2018-10-28 ENCOUNTER — Telehealth: Payer: Medicare Other | Admitting: Family

## 2018-10-28 DIAGNOSIS — M109 Gout, unspecified: Secondary | ICD-10-CM | POA: Diagnosis not present

## 2018-10-28 MED ORDER — PREDNISONE 10 MG PO TABS: 10.0000 mg | ORAL_TABLET | Freq: Every day | ORAL | 0 refills | Status: DC

## 2018-10-28 NOTE — Progress Notes (Signed)
  E Visit for Gout  We are sorry that you are not feeling well. Here is how we plan to help!  Based on what you submitted you have gout!   I have prescribed Prednisone 10 mg daily for 5 days and continue your diclofenac. Low purine diet.   Approximately 5 minutes was spent documenting and reviewing patient's chart.     GET HELP RIGHT AWAY If:  Symptoms don't go away after treatment.  Severe itching that persists.  If you rash spreads or swells.  If you rash begins to smell.  If it blisters and opens or develops a yellow-brown crust.  You develop a fever.  You have a sore throat.  You become short of breath.  MAKE SURE YOU:  Understand these instructions. Will watch your condition. Will get help right away if you are not doing well or get worse.  Thank you for choosing an e-visit. Your e-visit answers were reviewed by a board certified advanced clinical practitioner to complete your personal care plan. Depending upon the condition, your plan could have included both over the counter or prescription medications. Please review your pharmacy choice. Be sure that the pharmacy you have chosen is open so that you can pick up your prescription now.  If there is a problem you may message your provider in [place] to have the prescription routed to another pharmacy. Your safety is important to [place]. If you have drug allergies check your prescription carefully.  For the next 24 hours, you can use MyChart to ask questions about today's visit, request a non-urgent call back, or ask for a work or school excuse from your e-visit provider. You will get an email in the next two days asking about your experience. I hope that your e-visit has been valuable and will speed your recovery.

## 2018-12-23 ENCOUNTER — Encounter: Payer: Self-pay | Admitting: Family Medicine

## 2018-12-25 ENCOUNTER — Telehealth: Payer: Self-pay

## 2018-12-25 ENCOUNTER — Encounter: Payer: Self-pay | Admitting: Family Medicine

## 2018-12-25 ENCOUNTER — Ambulatory Visit (INDEPENDENT_AMBULATORY_CARE_PROVIDER_SITE_OTHER): Payer: Medicare Other | Admitting: Family Medicine

## 2018-12-25 VITALS — BP 130/80 | HR 80 | Ht 73.0 in | Wt 204.2 lb

## 2018-12-25 DIAGNOSIS — I739 Peripheral vascular disease, unspecified: Secondary | ICD-10-CM

## 2018-12-25 NOTE — Telephone Encounter (Signed)

## 2018-12-25 NOTE — Progress Notes (Signed)
Established Patient Office Visit  Subjective:  Patient ID: Roger Stewart, male    DOB: 1945/07/27  Age: 73 y.o. MRN: [id number]  CC:  Chief Complaint  Patient presents with  . leg cramps    HPI Roger Stewart presents for evaluation treatment of pain in his right calf that occurs consistently after walking about a quarter of a mile.  Pain tends to resolve quickly with rest.  Patient does have a history of lumbar radiculopathy and sciatica.  Back is been doing well.  He is able to play golf.  He does have a history of well-controlled hypertension with lisinopril and metoprolol.  He does smoke cigars.  History of hyperlipidemia and recently started atorvastatin this past April.  Past Medical History:  Diagnosis Date  . Cancer ([place])    Basal Cell X 8; Dr Nevada Crane  . Colon polyps    Dr Amedeo Plenty  . Diverticulosis of colon   . Hyperlipidemia    elevated TG  . Hypertension   . Sciatica     Past Surgical History:  Procedure Laterality Date  . ABDOMINAL AORTIC ANEURYSM REPAIR  2005  . colonoscopy with polypectomy  2010;2016   Dr Amedeo Plenty  . ESI X3     LS spine  . LUMBAR SPINE SURGERY  04/2010   spur & bulging disc L 4-5, [place] , [place]  08/2010   cyst resected @ op site    Family History  Problem Relation Age of Onset  . Heart disease Mother        CHF ,CABG  . Prostate cancer Father   . Hypertension Father   . Transient ischemic attack Father        in 2s  . Hypertension Brother   . Acromegaly Brother   . Prostate cancer Brother   . Leukemia Brother   . Diabetes Neg Hx     Social History   Socioeconomic History  . Marital status: Legally Separated    Spouse name: Not on file  . Number of children: 1  . Years of education: 16  . Highest education level: Not on file  Occupational History  . Not on file  Social Needs  . Financial resource strain: Not on file  . Food insecurity    Worry: Not on file    Inability: Not on file  . Transportation  needs    Medical: Not on file    Non-medical: Not on file  Tobacco Use  . Smoking status: Current Some Day Smoker    Types: Cigars    Last attempt to quit: 06/10/2000    Years since quitting: 18.5  . Smokeless tobacco: Never Used  . Tobacco comment:  Smoked [phone], up to 3/4 ppd. 05/22/15 1 cigar a day  Substance and Sexual Activity  . Alcohol use: Yes    Alcohol/week: 14.0 standard drinks    Types: 14 Cans of beer per week  . Drug use: No  . Sexual activity: Not on file  Lifestyle  . Physical activity    Days per week: Not on file    Minutes per session: Not on file  . Stress: Not on file  Relationships  . Social Herbalist on phone: Not on file    Gets together: Not on file    Attends religious service: Not on file    Active member of club or organization: Not on file    Attends meetings of clubs or  organizations: Not on file    Relationship status: Not on file  . Intimate partner violence    Fear of current or ex partner: Not on file    Emotionally abused: Not on file    Physically abused: Not on file    Forced sexual activity: Not on file  Other Topics Concern  . Not on file  Social History Narrative   Denies abuse and feels safe at home.     Outpatient Medications Prior to Visit  Medication Sig Dispense Refill  . atorvastatin (LIPITOR) 20 MG tablet Take 1 tablet (20 mg total) by mouth daily. 90 tablet 3  . diclofenac (VOLTAREN) 25 MG EC tablet Take 1 tablet (25 mg total) by mouth 2 (two) times daily as needed. 180 tablet 0  . diclofenac sodium (VOLTAREN) 1 % GEL Apply 2 g topically 4 (four) times daily. 100 g 1  . lisinopril (PRINIVIL,ZESTRIL) 20 MG tablet TAKE 1 TABLET(20 MG) BY MOUTH DAILY 90 tablet 1  . metoprolol tartrate (LOPRESSOR) 25 MG tablet Take 1 tablet (25 mg total) by mouth 2 (two) times daily. 180 tablet 1  . predniSONE (DELTASONE) 10 MG tablet Take 1 tablet (10 mg total) by mouth daily with breakfast. 5 tablet 0  . traMADol (ULTRAM) 50 MG  tablet Take 1 tablet (50 mg total) by mouth every 8 (eight) hours as needed. 30 tablet 0   No facility-administered medications prior to visit.     No Known Allergies  ROS Review of Systems  Constitutional: Negative.   Respiratory: Negative.   Cardiovascular: Negative.   Gastrointestinal: Negative.   Musculoskeletal: Positive for myalgias. Negative for gait problem.  Allergic/Immunologic: Negative for immunocompromised state.  Neurological: Negative for weakness and numbness.  Hematological: Does not bruise/bleed easily.  Psychiatric/Behavioral: Negative.       Objective:    Physical Exam  Constitutional: He is oriented to person, place, and time. He appears well-developed and well-nourished. No distress.  HENT:  Head: Atraumatic.  Right Ear: External ear normal.  Left Ear: External ear normal.  Eyes: Right eye exhibits no discharge. No scleral icterus.  Cardiovascular:  Pulses:      Dorsalis pedis pulses are 0 on the right side and 1+ on the left side.       Posterior tibial pulses are 0 on the right side and 1+ on the left side.  Sluggish capillary refill in right toe.   Pulmonary/Chest: Effort normal.  Neurological: He is alert and oriented to person, place, and time.  Skin: Skin is warm and dry. He is not diaphoretic.  Psychiatric: He has a normal mood and affect. His behavior is normal.    BP 130/80   Pulse 80   Ht 6\' 1"  (1.854 m)   Wt 204 lb 4 oz (92.6 kg)   SpO2 96%   BMI 26.95 kg/m  Wt Readings from Last 3 Encounters:  12/25/18 204 lb 4 oz (92.6 kg)  05/27/18 211 lb (95.7 kg)  05/26/17 211 lb 2 oz (95.8 kg)   BP Readings from Last 3 Encounters:  12/25/18 130/80  05/27/18 136/78  05/26/17 124/80   Guideline developer:  UpToDate (see UpToDate for funding source) Date Released: June 2014  Health Maintenance Due  Topic Date Due  . PNA vac Low Risk Adult (1 of 2 - PCV13) 02/27/2010    There are no preventive care reminders to display for this  patient.  Lab Results  Component Value Date   TSH 1.86 05/27/2018  Lab Results  Component Value Date   WBC 6.8 05/27/2018   HGB 13.8 05/27/2018   HCT 40.8 05/27/2018   MCV 97.0 05/27/2018   PLT 249.0 05/27/2018   Lab Results  Component Value Date   NA 138 05/27/2018   K 5.0 05/27/2018   CO2 30 05/27/2018   GLUCOSE 104 (H) 05/27/2018   BUN 14 05/27/2018   CREATININE 0.84 05/27/2018   BILITOT 0.6 05/27/2018   ALKPHOS 71 05/27/2018   AST 15 05/27/2018   ALT 12 05/27/2018   PROT 7.0 05/27/2018   ALBUMIN 4.0 05/27/2018   CALCIUM 9.3 05/27/2018   GFR 95.14 05/27/2018   Lab Results  Component Value Date   CHOL 222 (H) 05/27/2018   Lab Results  Component Value Date   HDL 66.80 05/27/2018   Lab Results  Component Value Date   LDLCALC 134 (H) 05/27/2018   Lab Results  Component Value Date   TRIG 108.0 05/27/2018   Lab Results  Component Value Date   CHOLHDL 3 05/27/2018   Lab Results  Component Value Date   HGBA1C 6.0 05/27/2018      Assessment & Plan:   Problem List Items Addressed This Visit    None    Visit Diagnoses    Intermittent claudication ([place])    -  Primary   Relevant Orders   [place] Lower Ext Art Bilat      No orders of the defined types were placed in this encounter.   Follow-up: Return in about 4 weeks (around 01/22/2019).

## 2018-12-25 NOTE — Patient Instructions (Signed)

## 2018-12-26 ENCOUNTER — Other Ambulatory Visit: Payer: Self-pay | Admitting: Family Medicine

## 2018-12-26 DIAGNOSIS — I10 Essential (primary) hypertension: Secondary | ICD-10-CM

## 2018-12-29 NOTE — Addendum Note (Signed)
Addended by: Jon Billings on: 12/29/2018 01:07 PM   Modules accepted: Orders

## 2018-12-30 ENCOUNTER — Other Ambulatory Visit: Payer: Self-pay

## 2018-12-30 ENCOUNTER — Ambulatory Visit (HOSPITAL_COMMUNITY)
Admission: RE | Admit: 2018-12-30 | Discharge: 2018-12-30 | Disposition: A | Discharge status: Home / Self Care | Payer: Medicare Other | Source: Ambulatory Visit | Attending: Family Medicine | Admitting: Family Medicine

## 2018-12-30 DIAGNOSIS — I739 Peripheral vascular disease, unspecified: Secondary | ICD-10-CM | POA: Diagnosis not present

## 2019-01-01 ENCOUNTER — Other Ambulatory Visit: Payer: Self-pay

## 2019-01-01 ENCOUNTER — Ambulatory Visit (INDEPENDENT_AMBULATORY_CARE_PROVIDER_SITE_OTHER): Payer: Medicare Other | Admitting: Family Medicine

## 2019-01-01 ENCOUNTER — Ambulatory Visit: Payer: Self-pay | Admitting: Family Medicine

## 2019-01-01 ENCOUNTER — Ambulatory Visit (HOSPITAL_COMMUNITY)
Admission: RE | Admit: 2019-01-01 | Discharge: 2019-01-01 | Disposition: A | Discharge status: Home / Self Care | Payer: Medicare Other | Source: Ambulatory Visit | Attending: Family Medicine | Admitting: Family Medicine

## 2019-01-01 ENCOUNTER — Encounter: Payer: Self-pay | Admitting: Family Medicine

## 2019-01-01 DIAGNOSIS — I739 Peripheral vascular disease, unspecified: Secondary | ICD-10-CM | POA: Diagnosis not present

## 2019-01-01 DIAGNOSIS — I10 Essential (primary) hypertension: Secondary | ICD-10-CM

## 2019-01-01 MED ORDER — AMLODIPINE BESYLATE 5 MG PO TABS: 5.0000 mg | ORAL_TABLET | Freq: Every day | ORAL | 2 refills | Status: DC

## 2019-01-01 NOTE — Progress Notes (Signed)
Established Patient Office Visit  Subjective:  Patient ID: Roger Stewart, male    DOB: August 03, 1945  Age: 73 y.o. MRN: [id number]  CC: No chief complaint on file.   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  Patient is already taking an aspirin daily and metoprolol and lisinopril.  Blood sugars have been mildly elevated hemoglobin A1c's have been normal.  His blood pressure is currently well controlled.  He is smoking cigars on occasion.  Past Medical History:  Diagnosis Date  . 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 Colon polyps    Dr Amedeo Plenty  . Diverticulosis of colon   . Hyperlipidemia    elevated TG  . Hypertension   . Sciatica     Past Surgical History:  Procedure Laterality Date  . ABDOMINAL AORTIC ANEURYSM REPAIR  2005  . colonoscopy with polypectomy  2010;2016   Dr Amedeo Plenty  . ESI X3     LS spine  . LUMBAR SPINE SURGERY  04/2010   spur & bulging disc L 4-5, [place] , [place]  08/2010   cyst resected @ op site    Family History  Problem Relation Age of Onset  . Heart disease Mother        CHF ,CABG  . Prostate cancer Father   . Hypertension Father   . Transient ischemic attack Father        in 42s  . Hypertension Brother   . Acromegaly Brother   . Prostate cancer Brother   . Leukemia Brother   . Diabetes Neg Hx     Social History   Socioeconomic History  . Marital status: Legally Separated    Spouse name: Not on file  . Number of children: 1  . Years of education: 81  . Highest education level: Not on file  Occupational History  . Not on file  Social Needs  . Financial resource strain: Not on file  . Food insecurity    Worry: Not on file    Inability: Not on file  . Transportation needs    Medical: Not on file    Non-medical: Not on file  Tobacco Use  . Smoking status: Current Some Day Smoker    Types: Cigars    Last attempt to quit: 06/10/2000    Years since quitting: 18.5  . Smokeless tobacco: Never Used  . Tobacco comment:  Smoked [phone], up to 3/4 ppd. 05/22/15 1 cigar a day  Substance and Sexual Activity  . Alcohol use: Yes    Alcohol/week: 14.0 standard drinks    Types: 14 Cans of beer per week  . Drug use: No  . Sexual activity: Not on file  Lifestyle  . Physical activity    Days per week: Not on file    Minutes per session: Not on file  . Stress: Not on file  Relationships  . Social Herbalist on phone: Not on file    Gets together: Not on file    Attends religious service: Not on file    Active member of club or organization: Not on file    Attends meetings of clubs or organizations: Not on file    Relationship status: Not on file  . Intimate partner violence    Fear of current or ex partner: Not  on file    Emotionally abused: Not on file    Physically abused: Not on file    Forced sexual activity: Not on file  Other Topics Concern  . Not on file  Social History Narrative   Denies abuse and feels safe at home.     Outpatient Medications Prior to Visit  Medication Sig Dispense Refill  . atorvastatin (LIPITOR) 20 MG tablet Take 1 tablet (20 mg total) by mouth daily. 90 tablet 3  . diclofenac (VOLTAREN) 25 MG EC tablet Take 1 tablet (25 mg total) by mouth 2 (two) times daily as needed. 180 tablet 0  . diclofenac sodium (VOLTAREN) 1 % GEL Apply 2 g topically 4 (four) times daily. 100 g 1  . metoprolol tartrate (LOPRESSOR) 25 MG tablet Take 1 tablet (25 mg total) by mouth 2 (two) times daily. 180 tablet 1  . predniSONE (DELTASONE) 10 MG tablet Take 1 tablet (10 mg total) by mouth daily with breakfast. 5 tablet 0  . traMADol (ULTRAM) 50 MG tablet Take 1 tablet (50 mg total) by mouth every 8 (eight) hours as needed. 30 tablet 0  . lisinopril (ZESTRIL) 20 MG tablet TAKE 1 TABLET(20 MG) BY MOUTH DAILY 90 tablet 2   No facility-administered medications  prior to visit.     No Known Allergies  ROS Review of Systems  Constitutional: Negative.   Respiratory: Negative.   Cardiovascular: Negative.   Gastrointestinal: Negative.   Musculoskeletal: Positive for gait problem and myalgias.  Skin: Negative for color change and pallor.  Neurological: Negative for weakness and numbness.  Psychiatric/Behavioral: Negative.       Objective:    Physical Exam  Constitutional: He is oriented to person, place, and time. He appears well-developed and well-nourished.  Pulmonary/Chest: Effort normal.  Neurological: He is alert and oriented to person, place, and time.  Psychiatric: He has a normal mood and affect. His behavior is normal.    There were no vitals taken for this visit. Wt Readings from Last 3 Encounters:  12/25/18 204 lb 4 oz (92.6 kg)  05/27/18 211 lb (95.7 kg)  05/26/17 211 lb 2 oz (95.8 kg)     Health Maintenance Due  Topic Date Due  . PNA vac Low Risk Adult (1 of 2 - PCV13) 02/27/2010    There are no preventive care reminders to display for this patient.  Lab Results  Component Value Date   TSH 1.86 05/27/2018   Lab Results  Component Value Date   WBC 6.8 05/27/2018   HGB 13.8 05/27/2018   HCT 40.8 05/27/2018   MCV 97.0 05/27/2018   PLT 249.0 05/27/2018   Lab Results  Component Value Date   NA 138 05/27/2018   K 5.0 05/27/2018   CO2 30 05/27/2018   GLUCOSE 104 (H) 05/27/2018   BUN 14 05/27/2018   CREATININE 0.84 05/27/2018   BILITOT 0.6 05/27/2018   ALKPHOS 71 05/27/2018   AST 15 05/27/2018   ALT 12 05/27/2018   PROT 7.0 05/27/2018   ALBUMIN 4.0 05/27/2018   CALCIUM 9.3 05/27/2018   GFR 95.14 05/27/2018   Lab Results  Component Value Date   CHOL 222 (H) 05/27/2018   Lab Results  Component Value Date   HDL 66.80 05/27/2018   Lab Results  Component Value Date   LDLCALC 134 (H) 05/27/2018   Lab Results  Component Value Date   TRIG 108.0 05/27/2018   Lab Results  Component Value Date    CHOLHDL 3 05/27/2018  Lab Results  Component Value Date   HGBA1C 6.0 05/27/2018      Assessment & Plan:   Problem List Items Addressed This Visit      Cardiovascular and Mediastinum   Essential hypertension   Relevant Medications   amLODipine (NORVASC) 5 MG tablet     Other   Intermittent claudication ([facility]) - Primary   Relevant Medications   amLODipine (NORVASC) 5 MG tablet      Meds ordered this encounter  Medications  . amLODipine (NORVASC) 5 MG tablet    Sig: Take 1 tablet (5 mg total) by mouth daily.    Dispense:  30 tablet    Refill:  2    Follow-up: Return in about 4 weeks (around 01/29/2019), or stop lisinopril and start norvasc 5mg  daily.  Patient will discontinue lisinopril and start the Norvasc.  Follow-up in 1 month to check progress.  May need referral to vascular surgery for consideration of intervention.  Advised him to stop smoking cigars.   Libby Maw, MD   Virtual Visit via Video Note Interactive video and audio telecommunications were attempted between myself and the patient. However they failed due to the patient having technical difficulties or not having access to video capability. We continued and completed with audio only.  I connected with [name] on 01/01/19 at  1:30 PM EDT by a video enabled telemedicine application and verified that I am speaking with the correct person using two identifiers.  [place] Provider:    I discussed the limitations of evaluation and management by telemedicine and the availability of in person appointments. The patient expressed understanding and agreed to proceed.  History of Present Illness:    Observations/Objective:   Assessment and Plan:   Follow Up Instructions:    I discussed the assessment and treatment plan with the patient. The patient was provided an opportunity to ask questions and all were answered. The patient agreed with the plan and  demonstrated an understanding of the instructions.   The patient was advised to call back or seek an in-person evaluation if the symptoms worsen or if the condition fails to improve as anticipated.  I provided 20 minutes of non-face-to-face time during this encounter.   Libby Maw, MD

## 2019-01-01 NOTE — Progress Notes (Signed)
RLE arterial duplex       has been completed. Preliminary results can be found under CV proc through chart review. June Leap, BS, RDMS, RVT    Called results to Judson Roch.

## 2019-01-01 NOTE — Telephone Encounter (Signed)
Sharee Pimple with [facility] calling in an urgent report for Dr. Ethelene Hal at [facility].    I was connected with Dr. Bebe Shaggy assistant Judson Roch.   I transferred Simpson call to Judson Roch for the report.   (It's also in the chart).

## 2019-01-06 ENCOUNTER — Encounter: Payer: Self-pay | Admitting: Family Medicine

## 2019-01-07 ENCOUNTER — Encounter: Payer: Self-pay | Admitting: Family Medicine

## 2019-02-08 DIAGNOSIS — B029 Zoster without complications: Secondary | ICD-10-CM | POA: Diagnosis not present

## 2019-02-23 DIAGNOSIS — M546 Pain in thoracic spine: Secondary | ICD-10-CM | POA: Diagnosis not present

## 2019-02-23 DIAGNOSIS — M9902 Segmental and somatic dysfunction of thoracic region: Secondary | ICD-10-CM | POA: Diagnosis not present

## 2019-02-23 DIAGNOSIS — M9901 Segmental and somatic dysfunction of cervical region: Secondary | ICD-10-CM | POA: Diagnosis not present

## 2019-02-23 DIAGNOSIS — M5432 Sciatica, left side: Secondary | ICD-10-CM | POA: Diagnosis not present

## 2019-02-23 DIAGNOSIS — M47817 Spondylosis without myelopathy or radiculopathy, lumbosacral region: Secondary | ICD-10-CM | POA: Diagnosis not present

## 2019-02-23 DIAGNOSIS — S161XXA Strain of muscle, fascia and tendon at neck level, initial encounter: Secondary | ICD-10-CM | POA: Diagnosis not present

## 2019-02-23 DIAGNOSIS — M9903 Segmental and somatic dysfunction of lumbar region: Secondary | ICD-10-CM | POA: Diagnosis not present

## 2019-02-23 DIAGNOSIS — M9904 Segmental and somatic dysfunction of sacral region: Secondary | ICD-10-CM | POA: Diagnosis not present

## 2019-02-25 DIAGNOSIS — M9901 Segmental and somatic dysfunction of cervical region: Secondary | ICD-10-CM | POA: Diagnosis not present

## 2019-02-25 DIAGNOSIS — M9904 Segmental and somatic dysfunction of sacral region: Secondary | ICD-10-CM | POA: Diagnosis not present

## 2019-02-25 DIAGNOSIS — M9903 Segmental and somatic dysfunction of lumbar region: Secondary | ICD-10-CM | POA: Diagnosis not present

## 2019-02-25 DIAGNOSIS — S161XXA Strain of muscle, fascia and tendon at neck level, initial encounter: Secondary | ICD-10-CM | POA: Diagnosis not present

## 2019-02-25 DIAGNOSIS — M5432 Sciatica, left side: Secondary | ICD-10-CM | POA: Diagnosis not present

## 2019-02-25 DIAGNOSIS — M9902 Segmental and somatic dysfunction of thoracic region: Secondary | ICD-10-CM | POA: Diagnosis not present

## 2019-02-25 DIAGNOSIS — M546 Pain in thoracic spine: Secondary | ICD-10-CM | POA: Diagnosis not present

## 2019-02-25 DIAGNOSIS — M47817 Spondylosis without myelopathy or radiculopathy, lumbosacral region: Secondary | ICD-10-CM | POA: Diagnosis not present

## 2019-02-26 DIAGNOSIS — M9904 Segmental and somatic dysfunction of sacral region: Secondary | ICD-10-CM | POA: Diagnosis not present

## 2019-02-26 DIAGNOSIS — M47817 Spondylosis without myelopathy or radiculopathy, lumbosacral region: Secondary | ICD-10-CM | POA: Diagnosis not present

## 2019-02-26 DIAGNOSIS — M5432 Sciatica, left side: Secondary | ICD-10-CM | POA: Diagnosis not present

## 2019-02-26 DIAGNOSIS — M546 Pain in thoracic spine: Secondary | ICD-10-CM | POA: Diagnosis not present

## 2019-02-26 DIAGNOSIS — M9901 Segmental and somatic dysfunction of cervical region: Secondary | ICD-10-CM | POA: Diagnosis not present

## 2019-02-26 DIAGNOSIS — M9903 Segmental and somatic dysfunction of lumbar region: Secondary | ICD-10-CM | POA: Diagnosis not present

## 2019-02-26 DIAGNOSIS — S161XXA Strain of muscle, fascia and tendon at neck level, initial encounter: Secondary | ICD-10-CM | POA: Diagnosis not present

## 2019-02-26 DIAGNOSIS — M9902 Segmental and somatic dysfunction of thoracic region: Secondary | ICD-10-CM | POA: Diagnosis not present

## 2019-03-01 DIAGNOSIS — M9904 Segmental and somatic dysfunction of sacral region: Secondary | ICD-10-CM | POA: Diagnosis not present

## 2019-03-01 DIAGNOSIS — S161XXA Strain of muscle, fascia and tendon at neck level, initial encounter: Secondary | ICD-10-CM | POA: Diagnosis not present

## 2019-03-01 DIAGNOSIS — M47817 Spondylosis without myelopathy or radiculopathy, lumbosacral region: Secondary | ICD-10-CM | POA: Diagnosis not present

## 2019-03-01 DIAGNOSIS — M9902 Segmental and somatic dysfunction of thoracic region: Secondary | ICD-10-CM | POA: Diagnosis not present

## 2019-03-01 DIAGNOSIS — M546 Pain in thoracic spine: Secondary | ICD-10-CM | POA: Diagnosis not present

## 2019-03-01 DIAGNOSIS — M5432 Sciatica, left side: Secondary | ICD-10-CM | POA: Diagnosis not present

## 2019-03-01 DIAGNOSIS — M9901 Segmental and somatic dysfunction of cervical region: Secondary | ICD-10-CM | POA: Diagnosis not present

## 2019-03-01 DIAGNOSIS — M9903 Segmental and somatic dysfunction of lumbar region: Secondary | ICD-10-CM | POA: Diagnosis not present

## 2019-03-17 ENCOUNTER — Telehealth: Payer: Self-pay

## 2019-03-17 NOTE — Telephone Encounter (Signed)

## 2019-03-18 ENCOUNTER — Encounter: Payer: Self-pay | Admitting: Family Medicine

## 2019-03-18 ENCOUNTER — Other Ambulatory Visit: Payer: Self-pay

## 2019-03-18 ENCOUNTER — Ambulatory Visit (INDEPENDENT_AMBULATORY_CARE_PROVIDER_SITE_OTHER): Payer: Medicare Other | Admitting: Family Medicine

## 2019-03-18 ENCOUNTER — Other Ambulatory Visit: Payer: Self-pay | Admitting: Family Medicine

## 2019-03-18 VITALS — BP 126/80 | HR 82 | Ht 73.0 in | Wt 204.0 lb

## 2019-03-18 DIAGNOSIS — I739 Peripheral vascular disease, unspecified: Secondary | ICD-10-CM | POA: Diagnosis not present

## 2019-03-18 DIAGNOSIS — Z23 Encounter for immunization: Secondary | ICD-10-CM | POA: Diagnosis not present

## 2019-03-18 DIAGNOSIS — E7800 Pure hypercholesterolemia, unspecified: Secondary | ICD-10-CM

## 2019-03-18 DIAGNOSIS — Z720 Tobacco use: Secondary | ICD-10-CM

## 2019-03-18 LAB — LDL CHOLESTEROL, DIRECT: Direct LDL: 98 mg/dL

## 2019-03-18 LAB — COMPREHENSIVE METABOLIC PANEL
ALT: 19 U/L (ref 0–53)
AST: 20 U/L (ref 0–37)
Albumin: 3.8 g/dL (ref 3.5–5.2)
Alkaline Phosphatase: 79 U/L (ref 39–117)
BUN: 12 mg/dL (ref 6–23)
CO2: 29 mEq/L (ref 19–32)
Calcium: 9.4 mg/dL (ref 8.4–10.5)
Chloride: 103 mEq/L (ref 96–112)
Creatinine, Ser: 0.71 mg/dL (ref 0.40–1.50)
GFR: 108.44 mL/min (ref >60.00)
Glucose, Bld: 99 mg/dL (ref 70–99)
Potassium: 4.6 mEq/L (ref 3.5–5.1)
Sodium: 138 mEq/L (ref 135–145)
Total Bilirubin: 0.6 mg/dL (ref 0.2–1.2)
Total Protein: 6.9 g/dL (ref 6.0–8.3)

## 2019-03-18 LAB — CBC
HCT: 38.9 % — ABNORMAL LOW (ref 39.0–52.0)
Hemoglobin: 13.1 g/dL (ref 13.0–17.0)
MCHC: 33.7 g/dL (ref 30.0–36.0)
MCV: 98 fl (ref 78.0–100.0)
Platelets: 187 10*3/uL (ref 150.0–400.0)
RBC: 3.97 Mil/uL — ABNORMAL LOW (ref 4.22–5.81)
RDW: 14.9 % (ref 11.5–15.5)
WBC: 6.8 10*3/uL (ref 4.0–10.5)

## 2019-03-18 MED ORDER — CILOSTAZOL 100 MG PO TABS: 100.0000 mg | ORAL_TABLET | Freq: Two times a day (BID) | ORAL | 1 refills | Status: DC

## 2019-03-18 NOTE — Progress Notes (Signed)
Established Patient Office Visit  Subjective:  Patient ID: Roger Stewart, male    DOB: April 28, 1946  Age: 73 y.o. MRN: [id number]  CC:  Chief Complaint  Patient presents with  . right calf    cramps after walking     HPI QUINDARRIUS BABBIT presents for follow-up of his claudication in the right calf.  And asked patient to see me sooner but he was unable to make it.  He did not tolerate the Norvasc due to malaise in general body aches and pains.  He continues to take the metoprolol.  Patient had stopped the atorvastatin because he completed therapy he thought.  He continues to smoke.  Continues to experience claudication type pains when ambulating for any distance.   Past Medical History:  Diagnosis Date  . Cancer ([place])    Basal Cell X 8; Dr Nevada Crane  . Colon polyps    Dr Amedeo Plenty  . Diverticulosis of colon   . Hyperlipidemia    elevated TG  . Hypertension   . Sciatica     Past Surgical History:  Procedure Laterality Date  . ABDOMINAL AORTIC ANEURYSM REPAIR  2005  . colonoscopy with polypectomy  2010;2016   Dr Amedeo Plenty  . ESI X3     LS spine  . LUMBAR SPINE SURGERY  04/2010   spur & bulging disc L 4-5, [place] , [place]  08/2010   cyst resected @ op site    Family History  Problem Relation Age of Onset  . Heart disease Mother        CHF ,CABG  . Prostate cancer Father   . Hypertension Father   . Transient ischemic attack Father        in 83s  . Hypertension Brother   . Acromegaly Brother   . Prostate cancer Brother   . Leukemia Brother   . Diabetes Neg Hx     Social History   Socioeconomic History  . Marital status: Legally Separated    Spouse name: Not on file  . Number of children: 1  . Years of education: 64  . Highest education level: Not on file  Occupational History  . Not on file  Social Needs  . Financial resource strain: Not on file  . Food insecurity    Worry: Not on file    Inability: Not on file  . Transportation needs   Medical: Not on file    Non-medical: Not on file  Tobacco Use  . Smoking status: Current Some Day Smoker    Types: Cigars    Last attempt to quit: 06/10/2000    Years since quitting: 18.7  . Smokeless tobacco: Never Used  . Tobacco comment:  Smoked [phone], up to 3/4 ppd. 05/22/15 1 cigar a day  Substance and Sexual Activity  . Alcohol use: Yes    Alcohol/week: 14.0 standard drinks    Types: 14 Cans of beer per week  . Drug use: No  . Sexual activity: Not on file  Lifestyle  . Physical activity    Days per week: Not on file    Minutes per session: Not on file  . Stress: Not on file  Relationships  . Social Herbalist on phone: Not on file    Gets together: Not on file    Attends religious service: Not on file    Active member of club or organization: Not on file    Attends meetings of clubs  or organizations: Not on file    Relationship status: Not on file  . Intimate partner violence    Fear of current or ex partner: Not on file    Emotionally abused: Not on file    Physically abused: Not on file    Forced sexual activity: Not on file  Other Topics Concern  . Not on file  Social History Narrative   Denies abuse and feels safe at home.     Outpatient Medications Prior to Visit  Medication Sig Dispense Refill  . amLODipine (NORVASC) 5 MG tablet Take 1 tablet (5 mg total) by mouth daily. 30 tablet 2  . atorvastatin (LIPITOR) 20 MG tablet Take 1 tablet (20 mg total) by mouth daily. 90 tablet 3  . diclofenac (VOLTAREN) 25 MG EC tablet Take 1 tablet (25 mg total) by mouth 2 (two) times daily as needed. 180 tablet 0  . diclofenac sodium (VOLTAREN) 1 % GEL Apply 2 g topically 4 (four) times daily. 100 g 1  . metoprolol tartrate (LOPRESSOR) 25 MG tablet Take 1 tablet (25 mg total) by mouth 2 (two) times daily. 180 tablet 1  . predniSONE (DELTASONE) 10 MG tablet Take 1 tablet (10 mg total) by mouth daily with breakfast. 5 tablet 0  . traMADol (ULTRAM) 50 MG tablet Take  1 tablet (50 mg total) by mouth every 8 (eight) hours as needed. 30 tablet 0   No facility-administered medications prior to visit.     No Known Allergies  ROS Review of Systems  Constitutional: Negative.   Respiratory: Negative.   Cardiovascular: Negative.   Gastrointestinal: Negative.   Musculoskeletal: Positive for gait problem and myalgias.  Hematological: Negative.   Psychiatric/Behavioral: Negative.       Objective:    Physical Exam  Constitutional: He is oriented to person, place, and time. He appears well-developed and well-nourished. No distress.  HENT:  Head: Normocephalic and atraumatic.  Right Ear: External ear normal.  Left Ear: External ear normal.  Eyes: Right eye exhibits no discharge. Left eye exhibits no discharge. No scleral icterus.  Neck: No JVD present. No tracheal deviation present.  Cardiovascular:  Pulses:      Dorsalis pedis pulses are 1+ on the right side.       Posterior tibial pulses are 1+ on the right side.  Slow capillary refill.   Pulmonary/Chest: Effort normal. No stridor.  Neurological: He is alert and oriented to person, place, and time.  Skin: Skin is warm and dry. He is not diaphoretic.  Psychiatric: He has a normal mood and affect. His behavior is normal.    BP 126/80   Pulse 82   Ht 6\' 1"  (1.854 m)   Wt 204 lb (92.5 kg)   SpO2 95%   BMI 26.91 kg/m  Wt Readings from Last 3 Encounters:  03/18/19 204 lb (92.5 kg)  12/25/18 204 lb 4 oz (92.6 kg)  05/27/18 211 lb (95.7 kg)   BP Readings from Last 3 Encounters:  03/18/19 126/80  12/25/18 130/80  05/27/18 136/78   Guideline developer:  UpToDate (see UpToDate for funding source) Date Released: June 2014  Health Maintenance Due  Topic Date Due  . URINE MICROALBUMIN  09/10/2007  . PNA vac Low Risk Adult (1 of 2 - PCV13) 02/27/2010  . INFLUENZA VACCINE  01/09/2019    There are no preventive care reminders to display for this patient.  Lab Results  Component Value Date    TSH 1.86 05/27/2018   Lab Results  Component Value Date   WBC 6.8 05/27/2018   HGB 13.8 05/27/2018   HCT 40.8 05/27/2018   MCV 97.0 05/27/2018   PLT 249.0 05/27/2018   Lab Results  Component Value Date   NA 138 05/27/2018   K 5.0 05/27/2018   CO2 30 05/27/2018   GLUCOSE 104 (H) 05/27/2018   BUN 14 05/27/2018   CREATININE 0.84 05/27/2018   BILITOT 0.6 05/27/2018   ALKPHOS 71 05/27/2018   AST 15 05/27/2018   ALT 12 05/27/2018   PROT 7.0 05/27/2018   ALBUMIN 4.0 05/27/2018   CALCIUM 9.3 05/27/2018   GFR 95.14 05/27/2018   Lab Results  Component Value Date   CHOL 222 (H) 05/27/2018   Lab Results  Component Value Date   HDL 66.80 05/27/2018   Lab Results  Component Value Date   LDLCALC 134 (H) 05/27/2018   Lab Results  Component Value Date   TRIG 108.0 05/27/2018   Lab Results  Component Value Date   CHOLHDL 3 05/27/2018   Lab Results  Component Value Date   HGBA1C 6.0 05/27/2018      Assessment & Plan:   Problem List Items Addressed This Visit      Cardiovascular and Mediastinum   PVD (peripheral vascular disease) ([place]) - Primary   Relevant Medications   cilostazol (PLETAL) 100 MG tablet   Other Relevant Orders   Comprehensive metabolic panel   LDL cholesterol, direct   Ambulatory referral to Vascular Surgery     Other   Tobacco use   Elevated LDL cholesterol level   Relevant Orders   CBC   Comprehensive metabolic panel   LDL cholesterol, direct    Other Visit Diagnoses    Need for influenza vaccination       Relevant Orders   Flu Vaccine QUAD High Dose(Fluad)      Meds ordered this encounter  Medications  . cilostazol (PLETAL) 100 MG tablet    Sig: Take 1 tablet (100 mg total) by mouth 2 (two) times daily.    Dispense:  60 tablet    Refill:  1    Follow-up: Return in about 1 month (around 04/18/2019).   Again stressed the importance of tobacco cessation.  Asked the patient to follow-up with me within a month.  Restart  atorvastatin and continue metoprolol.

## 2019-03-18 NOTE — Patient Instructions (Signed)
Peripheral Vascular Disease Peripheral vascular disease (PVD) is a disease of the blood vessels. A simple term for PVD is poor circulation. In most cases, PVD narrows the blood vessels that carry blood from your heart to the rest of your body. This can result in a decreased supply of blood to your arms, legs, and internal organs, like your stomach or kidneys. However, it most often affects a person's lower legs and feet. There are two types of PVD.  Organic PVD. This is the more common type. It is caused by damage to the structure of blood vessels.  Functional PVD. This is caused by conditions that make blood vessels contract and tighten (spasm). Without treatment, PVD tends to get worse over time. PVD can also lead to acute limb ischemia. This is when an arm or leg suddenly has trouble getting enough blood. This is a medical emergency. What are the causes?  Each type of PVD has many different causes. The most common cause of PVD is buildup of a fatty material (plaque) inside your arteries (atherosclerosis). Small amounts of plaque can break off from the walls of the blood vessels and become lodged in a smaller artery. This blocks blood flow and can cause acute limb ischemia. Other common causes of PVD include:  Blood clots that form inside of blood vessels.  Injuries to blood vessels.  Diseases that cause inflammation of blood vessels or cause blood vessel spasms.  Health behaviors and health history that increase your risk of developing PVD. What increases the risk? You are more likely to develop this condition if:  You have a family history of PVD.  You have certain medical conditions, including: ? High cholesterol. ? Diabetes. ? High blood pressure (hypertension). ? Coronary heart disease. ? Past problems with blood clots. ? Past injury, such as burns or a broken bone. These may have damaged blood vessels in your limbs. ? Buerger disease. This is caused by inflamed blood vessels  in your hands and feet. ? Some forms of arthritis. ? Rare birth defects that affect the arteries in your legs. ? Kidney disease.  You use tobacco or smoke.  You do not get enough exercise.  You are obese.  You are age 50 or older. What are the signs or symptoms? This condition may cause different symptoms. Your symptoms depend on what part of your body is not getting enough blood. Some common signs and symptoms include:  Cramps in your lower legs. This may be a symptom of poor leg circulation (claudication).  Pain and weakness in your legs. This happens while you are physically active but goes away when you rest (intermittent claudication).  Leg pain when at rest.  Leg numbness, tingling, or weakness.  Coldness in a leg or foot, especially when compared with the other leg.  Skin or hair changes. These can include: ? Hair loss. ? Shiny skin. ? Pale or bluish skin. ? Thick toenails.  Inability to get or maintain an erection (erectile dysfunction).  Fatigue. People with PVD are more likely to develop ulcers and sores on their toes, feet, or legs. These may take longer than normal to heal. How is this diagnosed? This condition is diagnosed based on:  Your signs and symptoms.  A physical exam and your medical history.  Other tests to find out what is causing your PVD and to determine its severity. Tests may include: ? Blood pressure recordings from your arms and legs and measurements of the strength of your pulses (pulse volume   recordings). ? Imaging studies using sound waves to take pictures of the blood flow through your blood vessels (Doppler ultrasound). ? Injecting a dye into your blood vessels before having imaging studies using:  X-rays (angiogram or arteriogram).  Computer-generated X-rays (CT angiogram).  A powerful electromagnetic field and a computer (magnetic resonance angiogram or MRA). How is this treated? Treatment for PVD depends on the cause of your  condition and how severe your symptoms are. It also depends on your age. Underlying causes need to be treated and controlled. These include long-term (chronic) conditions, such as diabetes, high cholesterol, and high blood pressure. Treatment includes:  Lifestyle changes, such as: ? Quitting smoking. ? Exercising regularly. ? Following a low-fat, low-cholesterol diet.  Taking medicines, such as: ? Blood thinners to prevent blood clots. ? Medicines to improve blood flow. ? Medicines to improve your blood cholesterol levels.  Surgical procedures, such as: ? A procedure that uses an inflated balloon to open a blocked artery and improve blood flow (angioplasty). ? A procedure to put in a wire mesh tube to keep a blocked artery open (stent implant). ? Surgery to reroute blood flow around a blocked artery (peripheral bypass surgery). ? Surgery to remove dead tissue from an infected wound on the affected limb. ? Amputation. This is surgical removal of the affected limb. It may be necessary in cases of acute limb ischemia where there has been no improvement through medical or surgical treatments. Follow these instructions at home: Lifestyle  Do not use any products that contain nicotine or tobacco, such as cigarettes and e-cigarettes. If you need help quitting, ask your health care provider.  Lose weight if you are overweight, and maintain a healthy weight as discussed by your health care provider.  Eat a diet that is low in fat and cholesterol. If you need help, ask your health care provider.  Exercise regularly. Ask your health care provider to suggest some good activities for you. General instructions  Take over-the-counter and prescription medicines only as told by your health care provider.  Take good care of your feet: ? Wear comfortable shoes that fit well. ? Check your feet often for any cuts or sores.  Keep all follow-up visits as told by your health care provider. This is  important. Contact a health care provider if:  You have cramps in your legs while walking.  You have leg pain when you are at rest.  You have coldness in a leg or foot.  Your skin changes.  You have erectile dysfunction.  You have cuts or sores on your feet that are not healing. Get help right away if:  Your arm or leg turns cold, numb, and blue.  Your arms or legs become red, warm, swollen, painful, or numb.  You have chest pain or trouble breathing.  You suddenly have weakness in your face, arm, or leg.  You become very confused or lose the ability to speak.  You suddenly have a very bad headache or lose your vision. Summary  Peripheral vascular disease (PVD) is a disease of the blood vessels.  In most cases, PVD narrows the blood vessels that carry blood from your heart to the rest of your body.  PVD may cause different symptoms. Your symptoms depend on what part of your body is not getting enough blood.  Treatment for PVD depends on the cause of your condition and how severe your symptoms are. This information is not intended to replace advice given to you by your   health care provider. Make sure you discuss any questions you have with your health care provider. Document Released: 07/04/2004 Document Revised: 05/09/2017 Document Reviewed: 07/04/2016 Elsevier Patient Education  [place]. Cilostazol tablets What is this medicine? CILOSTAZOL (sil [place] sta zol) is used to treat the symptoms of intermittent claudication. This condition causes pain in the legs during walking, and goes away with rest. By improving blood flow, this medicine helps people with this condition walk longer distances without pain. This medicine may be used for other purposes; ask your health care provider or pharmacist if you have questions. COMMON BRAND NAME(S): Pletal What should I tell my health care provider before I take this medicine? They need to know if you have any of the following  conditions:  bleeding disorder or hemophilia  history of heart failure, heart attack, or other heart disease  an unusual or allergic reaction to cilostazol, other medicines, foods, dyes, or preservatives  pregnant or trying to get pregnant  breast-feeding How should I use this medicine? Take this medicine by mouth with a full glass of water. Follow the directions on the prescription label. Take this medicine on an empty stomach, at least 30 minutes before or 2 hours after food. Do not take with food. Take your doses at regular intervals. Do not take your medicine more often than directed. Talk to your pediatrician regarding the use of this medicine in children. Special care may be needed. Overdosage: If you think you have taken too much of this medicine contact a poison control center or emergency room at once. NOTE: This medicine is only for you. Do not share this medicine with others. What if I miss a dose? If you miss a dose, take it as soon as you can. If it is almost time for your next dose, take only that dose. Do not take double or extra doses. What may interact with this medicine? Do not take this medicine with any of the following medications:  grapefruit juice This medicine may also interact with the following medications:  agents that prevent or treat blood clots like enoxaparin or warfarin  aspirin  diltiazem  erythromycin or clarithromycin  omeprazole  some medications for treating depression like fluoxetine, fluvoxamine, nefazodone  some medications for treating fungal infections like ketoconazole, fluconazole, itraconazole This list may not describe all possible interactions. Give your health care provider a list of all the medicines, herbs, non-prescription drugs, or dietary supplements you use. Also tell them if you smoke, drink alcohol, or use illegal drugs. Some items may interact with your medicine. What should I watch for while using this medicine? Visit  your doctor or health care professional for regular checks on your progress. It may take 2 to 4 weeks for your condition to start to get better once you begin taking this medicine. In some people, it can take as long as 3 months for the condition to get better. You may get drowsy or dizzy. Do not drive, use machinery, or do anything that needs mental alertness until you know how this drug affects you. Do not stand or sit up quickly, especially if you are an older patient. This reduces the risk of dizzy or fainting spells. Alcohol can make you more drowsy and dizzy. Avoid alcoholic drinks. Smoking may have effects on the circulation that may limit the benefits you receive from this medicine. You may wish to discuss how to stop smoking with your doctor or health care professional. If you are going to have  surgery, tell your doctor or health care professional that you are taking this medicine. What side effects may I notice from receiving this medicine? Side effects that you should report to your doctor or health care professional as soon as possible:  allergic reactions like skin rash, itching or hives, swelling of the face, lips, or tongue  chest pain  fast, slow, or irregular heartbeat  signs and symptoms of bleeding such as bloody or black, tarry stools; red or dark-brown urine; spitting up blood or brown material that looks like coffee grounds; red spots on the skin; unusual bruising or bleeding from the eye, gums, or nose  swelling in the legs or ankles Side effects that usually do not require medical attention (report to your doctor or health care professional if they continue or are bothersome):  diarrhea  headache  nausea, or upset stomach This list may not describe all possible side effects. Call your doctor for medical advice about side effects. You may report side effects to FDA at 1-800-FDA-1088. Where should I keep my medicine? Keep out of the reach of children. Store at room  temperature between 15 and 30 degrees C (59 and 86 degrees F). Throw away any unused medicine after the expiration date. NOTE: This sheet is a summary. It may not cover all possible information. If you have questions about this medicine, talk to your doctor, pharmacist, or health care provider.  2020 Elsevier/Gold Standard (2012-09-17 15:00:52)

## 2019-03-19 MED ORDER — LISINOPRIL 20 MG PO TABS: 20.0000 mg | ORAL_TABLET | Freq: Every day | ORAL | 2 refills | Status: DC

## 2019-03-25 ENCOUNTER — Other Ambulatory Visit: Payer: Self-pay | Admitting: Family Medicine

## 2019-03-25 DIAGNOSIS — I10 Essential (primary) hypertension: Secondary | ICD-10-CM

## 2019-03-29 ENCOUNTER — Other Ambulatory Visit: Payer: Self-pay | Admitting: Family Medicine

## 2019-03-29 DIAGNOSIS — I739 Peripheral vascular disease, unspecified: Secondary | ICD-10-CM

## 2019-03-29 DIAGNOSIS — I10 Essential (primary) hypertension: Secondary | ICD-10-CM

## 2019-04-01 ENCOUNTER — Other Ambulatory Visit: Payer: Self-pay | Admitting: Family Medicine

## 2019-04-01 DIAGNOSIS — I10 Essential (primary) hypertension: Secondary | ICD-10-CM

## 2019-04-14 DIAGNOSIS — Z85828 Personal history of other malignant neoplasm of skin: Secondary | ICD-10-CM | POA: Diagnosis not present

## 2019-04-14 DIAGNOSIS — X32XXXA Exposure to sunlight, initial encounter: Secondary | ICD-10-CM | POA: Diagnosis not present

## 2019-04-14 DIAGNOSIS — D1801 Hemangioma of skin and subcutaneous tissue: Secondary | ICD-10-CM | POA: Diagnosis not present

## 2019-04-14 DIAGNOSIS — L570 Actinic keratosis: Secondary | ICD-10-CM | POA: Diagnosis not present

## 2019-04-14 DIAGNOSIS — L578 Other skin changes due to chronic exposure to nonionizing radiation: Secondary | ICD-10-CM | POA: Diagnosis not present

## 2019-04-14 DIAGNOSIS — L728 Other follicular cysts of the skin and subcutaneous tissue: Secondary | ICD-10-CM | POA: Diagnosis not present

## 2019-04-14 DIAGNOSIS — Z08 Encounter for follow-up examination after completed treatment for malignant neoplasm: Secondary | ICD-10-CM | POA: Diagnosis not present

## 2019-04-14 DIAGNOSIS — L821 Other seborrheic keratosis: Secondary | ICD-10-CM | POA: Diagnosis not present

## 2019-04-19 DIAGNOSIS — M9905 Segmental and somatic dysfunction of pelvic region: Secondary | ICD-10-CM | POA: Diagnosis not present

## 2019-04-19 DIAGNOSIS — M9903 Segmental and somatic dysfunction of lumbar region: Secondary | ICD-10-CM | POA: Diagnosis not present

## 2019-04-19 DIAGNOSIS — M9904 Segmental and somatic dysfunction of sacral region: Secondary | ICD-10-CM | POA: Diagnosis not present

## 2019-04-19 DIAGNOSIS — M5417 Radiculopathy, lumbosacral region: Secondary | ICD-10-CM | POA: Diagnosis not present

## 2019-04-19 DIAGNOSIS — M47897 Other spondylosis, lumbosacral region: Secondary | ICD-10-CM | POA: Diagnosis not present

## 2019-04-19 DIAGNOSIS — M545 Low back pain: Secondary | ICD-10-CM | POA: Diagnosis not present

## 2019-04-19 DIAGNOSIS — M62830 Muscle spasm of back: Secondary | ICD-10-CM | POA: Diagnosis not present

## 2019-04-20 DIAGNOSIS — M5417 Radiculopathy, lumbosacral region: Secondary | ICD-10-CM | POA: Diagnosis not present

## 2019-04-20 DIAGNOSIS — M9904 Segmental and somatic dysfunction of sacral region: Secondary | ICD-10-CM | POA: Diagnosis not present

## 2019-04-20 DIAGNOSIS — M47897 Other spondylosis, lumbosacral region: Secondary | ICD-10-CM | POA: Diagnosis not present

## 2019-04-20 DIAGNOSIS — M9903 Segmental and somatic dysfunction of lumbar region: Secondary | ICD-10-CM | POA: Diagnosis not present

## 2019-04-20 DIAGNOSIS — M62830 Muscle spasm of back: Secondary | ICD-10-CM | POA: Diagnosis not present

## 2019-04-20 DIAGNOSIS — M9905 Segmental and somatic dysfunction of pelvic region: Secondary | ICD-10-CM | POA: Diagnosis not present

## 2019-04-20 DIAGNOSIS — M545 Low back pain: Secondary | ICD-10-CM | POA: Diagnosis not present

## 2019-04-21 DIAGNOSIS — M545 Low back pain: Secondary | ICD-10-CM | POA: Diagnosis not present

## 2019-04-21 DIAGNOSIS — M9904 Segmental and somatic dysfunction of sacral region: Secondary | ICD-10-CM | POA: Diagnosis not present

## 2019-04-21 DIAGNOSIS — M47897 Other spondylosis, lumbosacral region: Secondary | ICD-10-CM | POA: Diagnosis not present

## 2019-04-21 DIAGNOSIS — M9905 Segmental and somatic dysfunction of pelvic region: Secondary | ICD-10-CM | POA: Diagnosis not present

## 2019-04-21 DIAGNOSIS — M9903 Segmental and somatic dysfunction of lumbar region: Secondary | ICD-10-CM | POA: Diagnosis not present

## 2019-04-21 DIAGNOSIS — M5417 Radiculopathy, lumbosacral region: Secondary | ICD-10-CM | POA: Diagnosis not present

## 2019-04-21 DIAGNOSIS — M62830 Muscle spasm of back: Secondary | ICD-10-CM | POA: Diagnosis not present

## 2019-04-22 DIAGNOSIS — M9903 Segmental and somatic dysfunction of lumbar region: Secondary | ICD-10-CM | POA: Diagnosis not present

## 2019-04-22 DIAGNOSIS — M9905 Segmental and somatic dysfunction of pelvic region: Secondary | ICD-10-CM | POA: Diagnosis not present

## 2019-04-22 DIAGNOSIS — M62830 Muscle spasm of back: Secondary | ICD-10-CM | POA: Diagnosis not present

## 2019-04-22 DIAGNOSIS — M545 Low back pain: Secondary | ICD-10-CM | POA: Diagnosis not present

## 2019-04-22 DIAGNOSIS — M47897 Other spondylosis, lumbosacral region: Secondary | ICD-10-CM | POA: Diagnosis not present

## 2019-04-22 DIAGNOSIS — M5417 Radiculopathy, lumbosacral region: Secondary | ICD-10-CM | POA: Diagnosis not present

## 2019-04-22 DIAGNOSIS — M9904 Segmental and somatic dysfunction of sacral region: Secondary | ICD-10-CM | POA: Diagnosis not present

## 2019-04-23 DIAGNOSIS — M9905 Segmental and somatic dysfunction of pelvic region: Secondary | ICD-10-CM | POA: Diagnosis not present

## 2019-04-23 DIAGNOSIS — M9904 Segmental and somatic dysfunction of sacral region: Secondary | ICD-10-CM | POA: Diagnosis not present

## 2019-04-23 DIAGNOSIS — M5417 Radiculopathy, lumbosacral region: Secondary | ICD-10-CM | POA: Diagnosis not present

## 2019-04-23 DIAGNOSIS — M545 Low back pain: Secondary | ICD-10-CM | POA: Diagnosis not present

## 2019-04-23 DIAGNOSIS — M9903 Segmental and somatic dysfunction of lumbar region: Secondary | ICD-10-CM | POA: Diagnosis not present

## 2019-04-23 DIAGNOSIS — M47897 Other spondylosis, lumbosacral region: Secondary | ICD-10-CM | POA: Diagnosis not present

## 2019-04-23 DIAGNOSIS — M62830 Muscle spasm of back: Secondary | ICD-10-CM | POA: Diagnosis not present

## 2019-04-27 DIAGNOSIS — L570 Actinic keratosis: Secondary | ICD-10-CM | POA: Diagnosis not present

## 2019-05-02 ENCOUNTER — Encounter: Payer: Self-pay | Admitting: Family Medicine

## 2019-05-05 ENCOUNTER — Other Ambulatory Visit: Payer: Self-pay

## 2019-05-05 DIAGNOSIS — M9902 Segmental and somatic dysfunction of thoracic region: Secondary | ICD-10-CM | POA: Diagnosis not present

## 2019-05-05 DIAGNOSIS — M47817 Spondylosis without myelopathy or radiculopathy, lumbosacral region: Secondary | ICD-10-CM | POA: Diagnosis not present

## 2019-05-05 DIAGNOSIS — M9904 Segmental and somatic dysfunction of sacral region: Secondary | ICD-10-CM | POA: Diagnosis not present

## 2019-05-05 DIAGNOSIS — M546 Pain in thoracic spine: Secondary | ICD-10-CM | POA: Diagnosis not present

## 2019-05-05 DIAGNOSIS — M9903 Segmental and somatic dysfunction of lumbar region: Secondary | ICD-10-CM | POA: Diagnosis not present

## 2019-05-05 DIAGNOSIS — M5432 Sciatica, left side: Secondary | ICD-10-CM | POA: Diagnosis not present

## 2019-05-25 ENCOUNTER — Encounter: Payer: Self-pay | Admitting: Family Medicine

## 2019-05-31 ENCOUNTER — Ambulatory Visit (INDEPENDENT_AMBULATORY_CARE_PROVIDER_SITE_OTHER): Payer: Medicare Other | Admitting: Family Medicine

## 2019-05-31 ENCOUNTER — Other Ambulatory Visit: Payer: Self-pay

## 2019-05-31 ENCOUNTER — Encounter: Payer: Self-pay | Admitting: Family Medicine

## 2019-05-31 VITALS — BP 114/70 | HR 70 | Temp 98.5°F | Ht 72.0 in | Wt 205.8 lb

## 2019-05-31 DIAGNOSIS — N401 Enlarged prostate with lower urinary tract symptoms: Secondary | ICD-10-CM

## 2019-05-31 DIAGNOSIS — E7800 Pure hypercholesterolemia, unspecified: Secondary | ICD-10-CM

## 2019-05-31 DIAGNOSIS — I10 Essential (primary) hypertension: Secondary | ICD-10-CM

## 2019-05-31 DIAGNOSIS — I739 Peripheral vascular disease, unspecified: Secondary | ICD-10-CM | POA: Diagnosis not present

## 2019-05-31 DIAGNOSIS — Z720 Tobacco use: Secondary | ICD-10-CM | POA: Diagnosis not present

## 2019-05-31 LAB — COMPREHENSIVE METABOLIC PANEL
ALT: 15 U/L (ref 0–53)
AST: 17 U/L (ref 0–37)
Albumin: 3.8 g/dL (ref 3.5–5.2)
Alkaline Phosphatase: 81 U/L (ref 39–117)
BUN: 17 mg/dL (ref 6–23)
CO2: 27 mEq/L (ref 19–32)
Calcium: 9.4 mg/dL (ref 8.4–10.5)
Chloride: 103 mEq/L (ref 96–112)
Creatinine, Ser: 0.82 mg/dL (ref 0.40–1.50)
GFR: 91.78 mL/min (ref >60.00)
Glucose, Bld: 117 mg/dL — ABNORMAL HIGH (ref 70–99)
Potassium: 4.5 mEq/L (ref 3.5–5.1)
Sodium: 138 mEq/L (ref 135–145)
Total Bilirubin: 0.6 mg/dL (ref 0.2–1.2)
Total Protein: 6.4 g/dL (ref 6.0–8.3)

## 2019-05-31 LAB — URINALYSIS, ROUTINE W REFLEX MICROSCOPIC
Bilirubin Urine: NEGATIVE
Hgb urine dipstick: NEGATIVE
Ketones, ur: NEGATIVE
Leukocytes,Ua: NEGATIVE
Nitrite: NEGATIVE
Specific Gravity, Urine: 1.02 (ref 1.000–1.030)
Total Protein, Urine: NEGATIVE
Urine Glucose: NEGATIVE
Urobilinogen, UA: 0.2 (ref 0.0–1.0)
pH: 7.5 (ref 5.0–8.0)

## 2019-05-31 LAB — PSA: PSA: 0.63 ng/mL (ref 0.10–4.00)

## 2019-05-31 LAB — LDL CHOLESTEROL, DIRECT: Direct LDL: 54 mg/dL

## 2019-05-31 NOTE — Progress Notes (Signed)
Established Patient Office Visit  Subjective:  Patient ID: Roger Stewart, male    DOB: [birth year]  Age: 73 y.o. MRN: [id number]  CC:  Chief Complaint  Patient presents with  . Annual Exam    No concerns at this time    HPI Roger Stewart presents for follow-up of his hypertension, peripheral vascular disease elevated LDL cholesterol and smoking.  Still experiences intermittent claudication in the right calf.  Pletal has not seemed to help a great deal.  Unable to tolerate Norvasc.  Continues with metoprolol and lisinopril for his blood pressure.  He is taking the Lipitor.  Now.  Continues to smoke 1 or 2 cigars daily.  Past Medical History:  Diagnosis Date  . Cancer ([place])    Basal Cell X 8; Dr Nevada Crane  . Colon polyps    Dr Amedeo Plenty  . Diverticulosis of colon   . Hyperlipidemia    elevated TG  . Hypertension   . Sciatica     Past Surgical History:  Procedure Laterality Date  . ABDOMINAL AORTIC ANEURYSM REPAIR  2005  . colonoscopy with polypectomy  2010;2016   Dr Amedeo Plenty  . ESI X3     [facility] spine  . LUMBAR SPINE SURGERY  04/2010   spur & bulging disc L 4-5, [place] , [place]  08/2010   cyst resected @ op site    Family History  Problem Relation Age of Onset  . Heart disease Mother        CHF ,CABG  . Prostate cancer Father   . Hypertension Father   . Transient ischemic attack Father        in 52s  . Hypertension Brother   . Acromegaly Brother   . Prostate cancer Brother   . Leukemia Brother   . Diabetes Neg Hx     Social History   Socioeconomic History  . Marital status: Legally Separated    Spouse name: Not on file  . Number of children: 1  . Years of education: 82  . Highest education level: Not on file  Occupational History  . Not on file  Tobacco Use  . Smoking status: Current Some Day Smoker    Types: Cigars    Last attempt to quit: 06/10/2000    Years since quitting: 18.9  . Smokeless tobacco: Never Used  . Tobacco comment:  Smoked  [phone], up to 3/4 ppd. 05/22/15 1 cigar a day  Substance and Sexual Activity  . Alcohol use: Yes    Alcohol/week: 16.0 standard drinks    Types: 14 Cans of beer, 2 Glasses of wine per week  . Drug use: No  . Sexual activity: Not on file  Other Topics Concern  . Not on file  Social History Narrative   Denies abuse and feels safe at home.    Social Determinants of Health   Financial Resource Strain:   . Difficulty of Paying Living Expenses: Not on file  Food Insecurity:   . Worried About Charity fundraiser in the Last Year: Not on file  . Ran Out of Food in the Last Year: Not on file  Transportation Needs:   . Lack of Transportation (Medical): Not on file  . Lack of Transportation (Non-Medical): Not on file  Physical Activity:   . Days of Exercise per Week: Not on file  . Minutes of Exercise per Session: Not on file  Stress:   . Feeling of Stress : Not on  file  Social Connections:   . Frequency of Communication with Friends and Family: Not on file  . Frequency of Social Gatherings with Friends and Family: Not on file  . Attends Religious Services: Not on file  . Active Member of Clubs or Organizations: Not on file  . Attends Archivist Meetings: Not on file  . Marital Status: Not on file  Intimate Partner Violence:   . Fear of Current or Ex-Partner: Not on file  . Emotionally Abused: Not on file  . Physically Abused: Not on file  . Sexually Abused: Not on file    Outpatient Medications Prior to Visit  Medication Sig Dispense Refill  . atorvastatin (LIPITOR) 20 MG tablet Take 1 tablet (20 mg total) by mouth daily. 90 tablet 3  . cilostazol (PLETAL) 100 MG tablet TAKE 1 TABLET BY MOUTH TWICE DAILY 180 tablet 0  . diclofenac (VOLTAREN) 25 MG EC tablet Take 1 tablet (25 mg total) by mouth 2 (two) times daily as needed. 180 tablet 0  . diclofenac sodium (VOLTAREN) 1 % GEL Apply 2 g topically 4 (four) times daily. 100 g 1  . lisinopril (ZESTRIL) 20 MG tablet Take  1 tablet (20 mg total) by mouth daily. 90 tablet 2  . metoprolol tartrate (LOPRESSOR) 25 MG tablet TAKE 1 TABLET BY MOUTH DAILY 180 tablet 1  . predniSONE (DELTASONE) 10 MG tablet Take 1 tablet (10 mg total) by mouth daily with breakfast. 5 tablet 0  . traMADol (ULTRAM) 50 MG tablet Take 1 tablet (50 mg total) by mouth every 8 (eight) hours as needed. 30 tablet 0   No facility-administered medications prior to visit.    Allergies  Allergen Reactions  . Norvasc [Amlodipine Besylate] Other (See Comments)    Body aches    ROS Review of Systems  Constitutional: Negative.   HENT: Negative.   Eyes: Negative for photophobia and visual disturbance.  Respiratory: Negative.  Negative for shortness of breath and wheezing.   Cardiovascular: Negative.  Negative for chest pain.  Gastrointestinal: Negative.   Endocrine: Negative for polyphagia and polyuria.  Genitourinary: Negative for difficulty urinating, frequency and urgency.  Musculoskeletal: Negative for gait problem and joint swelling.  Skin: Negative for pallor and rash.  Allergic/Immunologic: Negative for immunocompromised state.  Neurological: Negative for light-headedness and numbness.  Hematological: Does not bruise/bleed easily.  Psychiatric/Behavioral: Negative.       Objective:    Physical Exam  Constitutional: He is oriented to person, place, and time. He appears well-developed and well-nourished. No distress.  HENT:  Head: Normocephalic and atraumatic.  Right Ear: External ear normal.  Left Ear: External ear normal.  Eyes: Conjunctivae are normal. Right eye exhibits no discharge. Left eye exhibits no discharge. No scleral icterus.  Neck: No JVD present. No tracheal deviation present.  Cardiovascular: Normal rate, regular rhythm and normal heart sounds.  Pulmonary/Chest: Effort normal and breath sounds normal. No stridor.  Neurological: He is alert and oriented to person, place, and time.  Skin: Skin is warm and dry. He  is not diaphoretic.  Psychiatric: He has a normal mood and affect. His behavior is normal.    BP 114/70   Pulse 70   Temp 98.5 F (36.9 C) (Oral)   Ht 6' (1.829 m)   Wt 205 lb 12.8 oz (93.4 kg)   BMI 27.91 kg/m  Wt Readings from Last 3 Encounters:  05/31/19 205 lb 12.8 oz (93.4 kg)  03/18/19 204 lb (92.5 kg)  12/25/18 204 lb  4 oz (92.6 kg)     Health Maintenance Due  Topic Date Due  . PNA vac Low Risk Adult (1 of 2 - PCV13) 02/27/2010    There are no preventive care reminders to display for this patient.  Lab Results  Component Value Date   TSH 1.86 05/27/2018   Lab Results  Component Value Date   WBC 6.8 03/18/2019   HGB 13.1 03/18/2019   HCT 38.9 (L) 03/18/2019   MCV 98.0 03/18/2019   PLT 187.0 03/18/2019   Lab Results  Component Value Date   NA 138 03/18/2019   K 4.6 03/18/2019   CO2 29 03/18/2019   GLUCOSE 99 03/18/2019   BUN 12 03/18/2019   CREATININE 0.71 03/18/2019   BILITOT 0.6 03/18/2019   ALKPHOS 79 03/18/2019   AST 20 03/18/2019   ALT 19 03/18/2019   PROT 6.9 03/18/2019   ALBUMIN 3.8 03/18/2019   CALCIUM 9.4 03/18/2019   GFR 108.44 03/18/2019   Lab Results  Component Value Date   CHOL 222 (H) 05/27/2018   Lab Results  Component Value Date   HDL 66.80 05/27/2018   Lab Results  Component Value Date   LDLCALC 134 (H) 05/27/2018   Lab Results  Component Value Date   TRIG 108.0 05/27/2018   Lab Results  Component Value Date   CHOLHDL 3 05/27/2018   Lab Results  Component Value Date   HGBA1C 6.0 05/27/2018      Assessment & Plan:   Problem List Items Addressed This Visit      Cardiovascular and Mediastinum   Essential hypertension   Relevant Orders   Comp Met (CMET)   PVD (peripheral vascular disease) ([place]) - Primary   Relevant Orders   Comp Met (CMET)   Ambulatory referral to Cardiology     Genitourinary   Benign localized prostatic hyperplasia with lower urinary tract symptoms (LUTS)   Relevant Orders   PSA    Urinalysis, Routine w reflex microscopic     Other   Tobacco use   Elevated LDL cholesterol level   Relevant Orders   Comp Met (CMET)   Direct LDL   Intermittent claudication ([place])   Relevant Orders   Ambulatory referral to Cardiology      No orders of the defined types were placed in this encounter.   Follow-up: Return in about 6 months (around 11/29/2019), or continue all meds..   Referral to cardiology for work-up of coronary artery disease given his present history.  We have discussed increased danger of smoking cigars multiple times.  He needs to quit and he knows it. Libby Maw, MD

## 2019-06-02 ENCOUNTER — Other Ambulatory Visit: Payer: Self-pay

## 2019-06-02 DIAGNOSIS — I739 Peripheral vascular disease, unspecified: Secondary | ICD-10-CM

## 2019-06-03 ENCOUNTER — Encounter: Payer: Self-pay | Admitting: Cardiology

## 2019-06-03 ENCOUNTER — Other Ambulatory Visit: Payer: Self-pay

## 2019-06-03 ENCOUNTER — Ambulatory Visit (INDEPENDENT_AMBULATORY_CARE_PROVIDER_SITE_OTHER): Payer: Medicare Other | Admitting: Cardiology

## 2019-06-03 VITALS — BP 118/82 | HR 92 | Ht 72.0 in | Wt 206.0 lb

## 2019-06-03 DIAGNOSIS — R0789 Other chest pain: Secondary | ICD-10-CM | POA: Diagnosis not present

## 2019-06-03 DIAGNOSIS — Z9889 Other specified postprocedural states: Secondary | ICD-10-CM

## 2019-06-03 DIAGNOSIS — I7090 Unspecified atherosclerosis: Secondary | ICD-10-CM | POA: Insufficient documentation

## 2019-06-03 DIAGNOSIS — E782 Mixed hyperlipidemia: Secondary | ICD-10-CM

## 2019-06-03 DIAGNOSIS — E7800 Pure hypercholesterolemia, unspecified: Secondary | ICD-10-CM | POA: Diagnosis not present

## 2019-06-03 DIAGNOSIS — I739 Peripheral vascular disease, unspecified: Secondary | ICD-10-CM | POA: Diagnosis not present

## 2019-06-03 DIAGNOSIS — Z720 Tobacco use: Secondary | ICD-10-CM

## 2019-06-03 DIAGNOSIS — I10 Essential (primary) hypertension: Secondary | ICD-10-CM | POA: Diagnosis not present

## 2019-06-03 DIAGNOSIS — Z8679 Personal history of other diseases of the circulatory system: Secondary | ICD-10-CM | POA: Insufficient documentation

## 2019-06-03 NOTE — Progress Notes (Signed)
Cardiology Office Note:    Date:  06/03/2019   ID:  Roger Stewart, DOB 08-Sep-1945, MRN [id number]  PCP:  Libby Maw, MD  Cardiologist:  Jenean Lindau, MD   Referring MD: Libby Maw,*    ASSESSMENT:    1. PVD (peripheral vascular disease) ([place])   2. Essential hypertension   3. Mixed hyperlipidemia   4. Intermittent claudication (HCC)   5. Elevated LDL cholesterol level   6. Tobacco use   7. Atherosclerotic vascular disease   8. Chest discomfort   9. H/O aortic aneurysm repair    PLAN:    In order of problems listed above:  1. Chest discomfort: Patient has significant atherosclerotic vascular disease.  I discussed with him and counseled him against tobacco use.  In view of the symptoms and the fact that they are atypical I will do a Lexiscan sestamibi.  He has known documented atherosclerotic vascular disease and I am concerned about his symptoms. 2. Essential hypertension: Blood pressure is stable echocardiogram will be done to assess murmur on auscultation 3. Mixed dyslipidemia: Lipids were reviewed and diet was discussed 4. Intermittent claudication: Importance of regular walking stressed with the patient.  Abstinence from nicotine use emphasized and he vocalized understanding and promises to do so 5. Follow-up appointment in a month or earlier if he has any concerns.   Medication Adjustments/Labs and Tests Ordered: Current medicines are reviewed at length with the patient today.  Concerns regarding medicines are outlined above.  No orders of the defined types were placed in this encounter.  No orders of the defined types were placed in this encounter.    History of Present Illness:    Roger Stewart is a 73 y.o. male who is being seen today for the evaluation of atherosclerotic vascular disease and chest discomfort at the request of Libby Maw,*.  Patient is a pleasant 73 year old male.  He has past medical history of aortic  aneurysm post repair, essential hypertension and dyslipidemia.  He has been diagnosed to have popliteal artery stenosis with thrombus.  He denies any problems at this time and takes care of activities of daily living.  He tells me that he has an appointment with his vascular surgeon.  He complains of occasional chest discomfort.  No orthopnea or PND.  At the time of my evaluation, the patient is alert awake oriented and in no distress.  He walks on a regular basis but has intermittent claudication that limits this.  Past Medical History:  Diagnosis Date  . Cancer ([place])    Basal Cell X 8; Dr Nevada Crane  . Colon polyps    Dr Amedeo Plenty  . Diverticulosis of colon   . Hyperlipidemia    elevated TG  . Hypertension   . Sciatica     Past Surgical History:  Procedure Laterality Date  . ABDOMINAL AORTIC ANEURYSM REPAIR  2005  . colonoscopy with polypectomy  2010;2016   Dr Amedeo Plenty  . ESI X3     LS spine  . LUMBAR SPINE SURGERY  04/2010   spur & bulging disc L 4-5, [place] , [place]  08/2010   cyst resected @ op site    Current Medications: Current Meds  Medication Sig  . atorvastatin (LIPITOR) 20 MG tablet Take 1 tablet (20 mg total) by mouth daily.  . cilostazol (PLETAL) 100 MG tablet TAKE 1 TABLET BY MOUTH TWICE DAILY  . lisinopril (ZESTRIL) 20 MG tablet Take 1 tablet (  20 mg total) by mouth daily.  . metoprolol tartrate (LOPRESSOR) 25 MG tablet TAKE 1 TABLET BY MOUTH DAILY     Allergies:   Norvasc [amlodipine besylate]   Social History   Socioeconomic History  . Marital status: Legally Separated    Spouse name: Not on file  . Number of children: 1  . Years of education: 69  . Highest education level: Not on file  Occupational History  . Not on file  Tobacco Use  . Smoking status: Current Some Day Smoker    Types: Cigars    Last attempt to quit: 06/10/2000    Years since quitting: 18.9  . Smokeless tobacco: Never Used  . Tobacco comment:  Smoked [phone], up to 3/4  ppd. 05/22/15 1 cigar a day  Substance and Sexual Activity  . Alcohol use: Yes    Alcohol/week: 16.0 standard drinks    Types: 14 Cans of beer, 2 Glasses of wine per week  . Drug use: No  . Sexual activity: Not on file  Other Topics Concern  . Not on file  Social History Narrative   Denies abuse and feels safe at home.    Social Determinants of Health   Financial Resource Strain:   . Difficulty of Paying Living Expenses: Not on file  Food Insecurity:   . Worried About Charity fundraiser in the Last Year: Not on file  . Ran Out of Food in the Last Year: Not on file  Transportation Needs:   . Lack of Transportation (Medical): Not on file  . Lack of Transportation (Non-Medical): Not on file  Physical Activity:   . Days of Exercise per Week: Not on file  . Minutes of Exercise per Session: Not on file  Stress:   . Feeling of Stress : Not on file  Social Connections:   . Frequency of Communication with Friends and Family: Not on file  . Frequency of Social Gatherings with Friends and Family: Not on file  . Attends Religious Services: Not on file  . Active Member of Clubs or Organizations: Not on file  . Attends Archivist Meetings: Not on file  . Marital Status: Not on file     Family History: The patient's family history includes Acromegaly in his brother; Heart disease in his mother; Hypertension in his brother and father; Leukemia in his brother; Prostate cancer in his brother and father; Transient ischemic attack in his father. There is no history of Diabetes.  ROS:   Please see the history of present illness.    All other systems reviewed and are negative.  EKGs/Labs/Other Studies Reviewed:    The following studies were reviewed today: Summary: Right: 75-99% stenosis noted in the distal superficial femoral artery. Dilated right popliteal artery with intramural thrombus.    Recent Labs: 03/18/2019: Hemoglobin 13.1; Platelets 187.0 05/31/2019: ALT 15; BUN  17; Creatinine, Ser 0.82; Potassium 4.5; Sodium 138  Recent Lipid Panel    Component Value Date/Time   CHOL 222 (H) 05/27/2018 1053   CHOL 251 (H) 05/18/2014 1201   TRIG 108.0 05/27/2018 1053   TRIG 184 (H) 05/18/2014 1201   TRIG 175 (H) 05/14/2006 1031   HDL 66.80 05/27/2018 1053   HDL 63 05/18/2014 1201   CHOLHDL 3 05/27/2018 1053   VLDL 21.6 05/27/2018 1053   LDLCALC 134 (H) 05/27/2018 1053   LDLCALC 151 (H) 05/18/2014 1201   LDLDIRECT 54.0 05/31/2019 1110    Physical Exam:    VS:  BP  118/82 (BP Location: Right Arm, Patient Position: Sitting, Cuff Size: Normal)   Pulse 92   Ht 6' (1.829 m)   Wt 206 lb (93.4 kg)   SpO2 98%   BMI 27.94 kg/m     Wt Readings from Last 3 Encounters:  06/03/19 206 lb (93.4 kg)  05/31/19 205 lb 12.8 oz (93.4 kg)  03/18/19 204 lb (92.5 kg)     GEN: Patient is in no acute distress HEENT: Normal NECK: No JVD; No carotid bruits LYMPHATICS: No lymphadenopathy CARDIAC: S1 S2 regular, 2/6 systolic murmur at the apex. RESPIRATORY:  Clear to auscultation without rales, wheezing or rhonchi  ABDOMEN: Soft, non-tender, non-distended MUSCULOSKELETAL:  No edema; No deformity  SKIN: Warm and dry NEUROLOGIC:  Alert and oriented x 3 PSYCHIATRIC:  Normal affect    Signed, Jenean Lindau, MD  06/03/2019 10:52 AM    [place]

## 2019-06-03 NOTE — Patient Instructions (Signed)
Medication Instructions:  Your physician recommends that you continue on your current medications as directed. Please refer to the Current Medication list given to you today.  If you need a refill on your cardiac medications before your next appointment, please call your pharmacy.   Lab work: NONE If you have labs (blood work) drawn today and your tests are completely normal, you will receive your results only by: Marland Kitchen MyChart Message (if you have MyChart) OR . A paper copy in the mail If you have any lab test that is abnormal or we need to change your treatment, we will call you to review the results.  Testing/Procedures: You had an EKG performed today.  Your physician has requested that you have an echocardiogram. Echocardiography is a painless test that uses sound waves to create images of your heart. It provides your doctor with information about the size and shape of your heart and how well your heart's chambers and valves are working. This procedure takes approximately one hour. There are no restrictions for this procedure.  Your physician has requested that you have a lexiscan myoview. For further information please visit [email or web address]. Please follow instruction sheet, as given.    Follow-Up: At [facility], you and your health needs are our priority.  As part of our continuing mission to provide you with exceptional heart care, we have created designated Provider Care Teams.  These Care Teams include your primary Cardiologist (physician) and Advanced Practice Providers (APPs -  Physician Assistants and Nurse Practitioners) who all work together to provide you with the care you need, when you need it. You will need a follow up appointment in 1 months.   Any Other Special Instructions Will Be Listed Below  Regadenoson injection What is this medicine? REGADENOSON is used to test the heart for coronary artery disease. It is used in patients who can not exercise for their stress  test. This medicine may be used for other purposes; ask your health care provider or pharmacist if you have questions. COMMON BRAND NAME(S): Lexiscan What should I tell my health care provider before I take this medicine? They need to know if you have any of these conditions:  heart problems  lung or breathing disease, like asthma or COPD  an unusual or allergic reaction to regadenoson, other medicines, foods, dyes, or preservatives  pregnant or trying to get pregnant  breast-feeding How should I use this medicine? This medicine is for injection into a vein. It is given by a health care professional in a hospital or clinic setting. Talk to your pediatrician regarding the use of this medicine in children. Special care may be needed. Overdosage: If you think you have taken too much of this medicine contact a poison control center or emergency room at once. NOTE: This medicine is only for you. Do not share this medicine with others. What if I miss a dose? This does not apply. What may interact with this medicine?  caffeine  dipyridamole  guarana  theophylline This list may not describe all possible interactions. Give your health care provider a list of all the medicines, herbs, non-prescription drugs, or dietary supplements you use. Also tell them if you smoke, drink alcohol, or use illegal drugs. Some items may interact with your medicine. What should I watch for while using this medicine? Your condition will be monitored carefully while you are receiving this medicine. Do not take medicines, foods, or drinks with caffeine (like coffee, tea, or colas) for at least 12 hours  before your test. If you do not know if something contains caffeine, ask your health care professional. What side effects may I notice from receiving this medicine? Side effects that you should report to your doctor or health care professional as soon as possible:  allergic reactions like skin rash, itching or  hives, swelling of the face, lips, or tongue  breathing problems  chest pain, tightness or palpitations  severe headache Side effects that usually do not require medical attention (report to your doctor or health care professional if they continue or are bothersome):  flushing  headache  irritation or pain at site where injected  nausea, vomiting This list may not describe all possible side effects. Call your doctor for medical advice about side effects. You may report side effects to FDA at 1-800-FDA-1088. Where should I keep my medicine? This drug is given in a hospital or clinic and will not be stored at home. NOTE: This sheet is a summary. It may not cover all possible information. If you have questions about this medicine, talk to your doctor, pharmacist, or health care provider.  2020 Elsevier/Gold Standard (2008-01-25 15:08:13)  Cardiac Nuclear Scan A cardiac nuclear scan is a test that is done to check the flow of blood to your heart. It is done when you are resting and when you are exercising. The test looks for problems such as:  Not enough blood reaching a portion of the heart.  The heart muscle not working as it should. You may need this test if:  You have heart disease.  You have had lab results that are not normal.  You have had heart surgery or a balloon procedure to open up blocked arteries (angioplasty).  You have chest pain.  You have shortness of breath. In this test, a special dye (tracer) is put into your bloodstream. The tracer will travel to your heart. A camera will then take pictures of your heart to see how the tracer moves through your heart. This test is usually done at a hospital and takes 2-4 hours. Tell a doctor about:  Any allergies you have.  All medicines you are taking, including vitamins, herbs, eye drops, creams, and over-the-counter medicines.  Any problems you or family members have had with anesthetic medicines.  Any blood  disorders you have.  Any surgeries you have had.  Any medical conditions you have.  Whether you are pregnant or may be pregnant. What are the risks? Generally, this is a safe test. However, problems may occur, such as:  Serious chest pain and heart attack. This is only a risk if the stress portion of the test is done.  Rapid heartbeat.  A feeling of warmth in your chest. This feeling usually does not last long.  Allergic reaction to the tracer. What happens before the test?  Ask your doctor about changing or stopping your normal medicines. This is important.  Follow instructions from your doctor about what you cannot eat or drink.  Remove your jewelry on the day of the test. What happens during the test?  An IV tube will be inserted into one of your veins.  Your doctor will give you a small amount of tracer through the IV tube.  You will wait for 20-40 minutes while the tracer moves through your bloodstream.  Your heart will be monitored with an electrocardiogram (ECG).  You will lie down on an exam table.  Pictures of your heart will be taken for about 15-20 minutes.  You may   also have a stress test. For this test, one of these things may be done: ? You will be asked to exercise on a treadmill or a stationary bike. ? You will be given medicines that will make your heart work harder. This is done if you are unable to exercise.  When blood flow to your heart has peaked, a tracer will again be given through the IV tube.  After 20-40 minutes, you will get back on the exam table. More pictures will be taken of your heart.  Depending on the tracer that is used, more pictures may need to be taken 3-4 hours later.  Your IV tube will be removed when the test is over. The test may vary among doctors and hospitals. What happens after the test?  Ask your doctor: ? Whether you can return to your normal schedule, including diet, activities, and medicines. ? Whether you should  drink more fluids. This will help to remove the tracer from your body. Drink enough fluid to keep your pee (urine) pale yellow.  Ask your doctor, or the department that is doing the test: ? When will my results be ready? ? How will I get my results? Summary  A cardiac nuclear scan is a test that is done to check the flow of blood to your heart.  Tell your doctor whether you are pregnant or may be pregnant.  Before the test, ask your doctor about changing or stopping your normal medicines. This is important.  Ask your doctor whether you can return to your normal activities. You may be asked to drink more fluids. This information is not intended to replace advice given to you by your health care provider. Make sure you discuss any questions you have with your health care provider. Document Released: 11/10/2017 Document Revised: 09/16/2018 Document Reviewed: 11/10/2017 Elsevier Patient Education  2020 Elsevier Inc.  Echocardiogram An echocardiogram is a procedure that uses painless sound waves (ultrasound) to produce an image of the heart. Images from an echocardiogram can provide important information about:  Signs of coronary artery disease (CAD).  Aneurysm detection. An aneurysm is a weak or damaged part of an artery wall that bulges out from the normal force of blood pumping through the body.  Heart size and shape. Changes in the size or shape of the heart can be associated with certain conditions, including heart failure, aneurysm, and CAD.  Heart muscle function.  Heart valve function.  Signs of a past heart attack.  Fluid buildup around the heart.  Thickening of the heart muscle.  A tumor or infectious growth around the heart valves. Tell a health care provider about:  Any allergies you have.  All medicines you are taking, including vitamins, herbs, eye drops, creams, and over-the-counter medicines.  Any blood disorders you have.  Any surgeries you have had.  Any  medical conditions you have.  Whether you are pregnant or may be pregnant. What are the risks? Generally, this is a safe procedure. However, problems may occur, including:  Allergic reaction to dye (contrast) that may be used during the procedure. What happens before the procedure? No specific preparation is needed. You may eat and drink normally. What happens during the procedure?   An IV tube may be inserted into one of your veins.  You may receive contrast through this tube. A contrast is an injection that improves the quality of the pictures from your heart.  A gel will be applied to your chest.  A wand-like tool (transducer)   will be moved over your chest. The gel will help to transmit the sound waves from the transducer.  The sound waves will harmlessly bounce off of your heart to allow the heart images to be captured in real-time motion. The images will be recorded on a computer. The procedure may vary among health care providers and hospitals. What happens after the procedure?  You may return to your normal, everyday life, including diet, activities, and medicines, unless your health care provider tells you not to do that. Summary  An echocardiogram is a procedure that uses painless sound waves (ultrasound) to produce an image of the heart.  Images from an echocardiogram can provide important information about the size and shape of your heart, heart muscle function, heart valve function, and fluid buildup around your heart.  You do not need to do anything to prepare before this procedure. You may eat and drink normally.  After the echocardiogram is completed, you may return to your normal, everyday life, unless your health care provider tells you not to do that. This information is not intended to replace advice given to you by your health care provider. Make sure you discuss any questions you have with your health care provider. Document Released: 05/24/2000 Document  Revised: 09/17/2018 Document Reviewed: 06/29/2016 Elsevier Patient Education  2020 [employer].

## 2019-06-07 ENCOUNTER — Telehealth (HOSPITAL_COMMUNITY): Payer: Self-pay | Admitting: *Deleted

## 2019-06-07 NOTE — Telephone Encounter (Signed)

## 2019-06-07 NOTE — Telephone Encounter (Signed)
Patient given detailed instructions per Myocardial Perfusion Study Information Sheet for the test on 06/09/19 at 10:30. Patient notified to arrive 15 minutes early and that it is imperative to arrive on time for appointment to keep from having the test rescheduled.  If you need to cancel or reschedule your appointment, please call the office within 24 hours of your appointment. . Patient verbalized understanding.Veronia Beets

## 2019-06-08 ENCOUNTER — Ambulatory Visit (HOSPITAL_BASED_OUTPATIENT_CLINIC_OR_DEPARTMENT_OTHER)
Admission: RE | Admit: 2019-06-08 | Discharge: 2019-06-08 | Disposition: A | Discharge status: Home / Self Care | Payer: Medicare Other | Source: Ambulatory Visit | Attending: Vascular Surgery | Admitting: Vascular Surgery

## 2019-06-08 ENCOUNTER — Ambulatory Visit (HOSPITAL_BASED_OUTPATIENT_CLINIC_OR_DEPARTMENT_OTHER)
Admission: RE | Admit: 2019-06-08 | Discharge: 2019-06-08 | Disposition: A | Discharge status: Home / Self Care | Payer: Medicare Other | Source: Ambulatory Visit | Attending: Cardiology | Admitting: Cardiology

## 2019-06-08 ENCOUNTER — Ambulatory Visit (INDEPENDENT_AMBULATORY_CARE_PROVIDER_SITE_OTHER): Payer: Medicare Other | Admitting: Vascular Surgery

## 2019-06-08 ENCOUNTER — Encounter: Payer: Self-pay | Admitting: Vascular Surgery

## 2019-06-08 ENCOUNTER — Other Ambulatory Visit: Payer: Self-pay

## 2019-06-08 VITALS — BP 133/89 | HR 79 | Temp 97.4°F | Resp 18 | Ht 72.0 in | Wt 205.0 lb

## 2019-06-08 DIAGNOSIS — I739 Peripheral vascular disease, unspecified: Secondary | ICD-10-CM

## 2019-06-08 DIAGNOSIS — Z9889 Other specified postprocedural states: Secondary | ICD-10-CM | POA: Insufficient documentation

## 2019-06-08 DIAGNOSIS — Z8679 Personal history of other diseases of the circulatory system: Secondary | ICD-10-CM

## 2019-06-08 DIAGNOSIS — R0789 Other chest pain: Secondary | ICD-10-CM

## 2019-06-08 DIAGNOSIS — I7090 Unspecified atherosclerosis: Secondary | ICD-10-CM | POA: Diagnosis present

## 2019-06-08 DIAGNOSIS — I10 Essential (primary) hypertension: Secondary | ICD-10-CM | POA: Insufficient documentation

## 2019-06-08 NOTE — Progress Notes (Signed)
  Echocardiogram 2D Echocardiogram has been performed.  Jannett Celestine 06/08/2019, 3:10 PM

## 2019-06-08 NOTE — Progress Notes (Signed)
Patient name: Roger Stewart MRN: [id number] DOB: 1945-09-21 Sex: male  REASON FOR CONSULT: Evaluate for PVD/claudication  HPI: Roger Stewart is a 73 y.o. male, with history of hypertension, hyperlipidemia that presents for evaluation of PAD/PVD and claudication in his right leg.  Patient states he has been having burning in his right calf since about 6 months ago.  Initially was able to walk about 5 to 10 minutes before any of the calf pain started.  Now he notices it more commonly going up and down stairs.  He does live in [place] half the year and states it does not affect his golf or tennis at this time.  He has been on Pletal since October and has noticed minimal difference.  He has had an open abdominal aneurysm repair in the past.  No other lower extremity interventions.  He does take aspirin statin daily.  States he smokes cigars.  Past Medical History:  Diagnosis Date  . Cancer ([place])    Basal Cell X 8; Dr [place] Crane  . Colon polyps    Dr Amedeo Plenty  . Diverticulosis of colon   . Hyperlipidemia    elevated TG  . Hypertension   . Sciatica     Past Surgical History:  Procedure Laterality Date  . ABDOMINAL AORTIC ANEURYSM REPAIR  2005  . colonoscopy with polypectomy  2010;2016   Dr Amedeo Plenty  . ESI X3     LS spine  . LUMBAR SPINE SURGERY  04/2010   spur & bulging disc L 4-5, [place] , [place]  08/2010   cyst resected @ op site    Family History  Problem Relation Age of Onset  . Heart disease Mother        CHF ,CABG  . Prostate cancer Father   . Hypertension Father   . Transient ischemic attack Father        in 14s  . Hypertension Brother   . Acromegaly Brother   . Prostate cancer Brother   . Leukemia Brother   . Diabetes Neg Hx     SOCIAL HISTORY: Social History   Socioeconomic History  . Marital status: Legally Separated    Spouse name: Not on file  . Number of children: 1  . Years of education: 45  . Highest education level: Not on file   Occupational History  . Not on file  Tobacco Use  . Smoking status: Current Some Day Smoker    Types: Cigars    Last attempt to quit: 06/10/2000    Years since quitting: 19.0  . Smokeless tobacco: Never Used  . Tobacco comment:  Smoked [phone], up to 3/4 ppd. 05/22/15 1 cigar a day  Substance and Sexual Activity  . Alcohol use: Yes    Alcohol/week: 16.0 standard drinks    Types: 14 Cans of beer, 2 Glasses of wine per week  . Drug use: No  . Sexual activity: Not on file  Other Topics Concern  . Not on file  Social History Narrative   Denies abuse and feels safe at home.    Social Determinants of Health   Financial Resource Strain:   . Difficulty of Paying Living Expenses: Not on file  Food Insecurity:   . Worried About Charity fundraiser in the Last Year: Not on file  . Ran Out of Food in the Last Year: Not on file  Transportation Needs:   . Lack of Transportation (Medical): Not on file  .  Lack of Transportation (Non-Medical): Not on file  Physical Activity:   . Days of Exercise per Week: Not on file  . Minutes of Exercise per Session: Not on file  Stress:   . Feeling of Stress : Not on file  Social Connections:   . Frequency of Communication with Friends and Family: Not on file  . Frequency of Social Gatherings with Friends and Family: Not on file  . Attends Religious Services: Not on file  . Active Member of Clubs or Organizations: Not on file  . Attends Archivist Meetings: Not on file  . Marital Status: Not on file  Intimate Partner Violence:   . Fear of Current or Ex-Partner: Not on file  . Emotionally Abused: Not on file  . Physically Abused: Not on file  . Sexually Abused: Not on file    Allergies  Allergen Reactions  . Norvasc [Amlodipine Besylate] Other (See Comments)    Body aches    Current Outpatient Medications  Medication Sig Dispense Refill  . atorvastatin (LIPITOR) 20 MG tablet Take 1 tablet (20 mg total) by mouth daily. 90 tablet 3   . lisinopril (ZESTRIL) 20 MG tablet Take 1 tablet (20 mg total) by mouth daily. 90 tablet 2  . metoprolol tartrate (LOPRESSOR) 25 MG tablet TAKE 1 TABLET BY MOUTH DAILY 180 tablet 1  . allopurinol (ZYLOPRIM) 100 MG tablet     . cilostazol (PLETAL) 100 MG tablet TAKE 1 TABLET BY MOUTH TWICE DAILY (Patient not taking: Reported on 06/08/2019) 180 tablet 0  . diclofenac (VOLTAREN) 25 MG EC tablet Take 1 tablet (25 mg total) by mouth 2 (two) times daily as needed. (Patient not taking: Reported on 06/03/2019) 180 tablet 0  . diclofenac sodium (VOLTAREN) 1 % GEL Apply 2 g topically 4 (four) times daily. (Patient not taking: Reported on 06/03/2019) 100 g 1  . predniSONE (DELTASONE) 10 MG tablet Take 1 tablet (10 mg total) by mouth daily with breakfast. (Patient not taking: Reported on 06/03/2019) 5 tablet 0  . traMADol (ULTRAM) 50 MG tablet Take 1 tablet (50 mg total) by mouth every 8 (eight) hours as needed. (Patient not taking: Reported on 06/03/2019) 30 tablet 0   No current facility-administered medications for this visit.    REVIEW OF SYSTEMS:  [X]  denotes positive finding, [ ]  denotes negative finding Cardiac  Comments:  Chest pain or chest pressure:    Shortness of breath upon exertion:    Short of breath when lying flat:    Irregular heart rhythm:        Vascular    Pain in calf, thigh, or hip brought on by ambulation: x Right  Pain in feet at night that wakes you up from your sleep:     Blood clot in your veins:    Leg swelling:         Pulmonary    Oxygen at home:    Productive cough:     Wheezing:         Neurologic    Sudden weakness in arms or legs:     Sudden numbness in arms or legs:     Sudden onset of difficulty speaking or slurred speech:    Temporary loss of vision in one eye:     Problems with dizziness:         Gastrointestinal    Blood in stool:     Vomited blood:         Genitourinary    Burning  when urinating:     Blood in urine:        Psychiatric     Major depression:         Hematologic    Bleeding problems:    Problems with blood clotting too easily:        Skin    Rashes or ulcers:        Constitutional    Fever or chills:      PHYSICAL EXAM: Vitals:   06/08/19 0934  BP: 133/89  Pulse: 79  Resp: 18  Temp: (!) 97.4 F (36.3 C)  TempSrc: Temporal  SpO2: 96%  Weight: 205 lb (93 kg)  Height: 6' (1.829 m)    GENERAL: The patient is a well-nourished male, in no acute distress. The vital signs are documented above. CARDIAC: There is a regular rate and rhythm.  VASCULAR:  2+ palpable femoral pulse bilaterally Left dorsalis pedis and posterior tibial pulse palpable No palpable right pedal pulses No lower extremity tissue loss PULMONARY: There is good air exchange bilaterally without wheezing or rales. ABDOMEN: Soft and non-tender with normal pitched bowel sounds.  MUSCULOSKELETAL: There are no major deformities or cyanosis. NEUROLOGIC: No focal weakness or paresthesias are detected. SKIN: There are no ulcers or rashes noted. PSYCHIATRIC: The patient has a normal affect.  DATA:   ABIs are 0.66 on the right biphasic and 1.09 on the left triphasic  Assessment/Plan:  73 year old male presents for evaluation of PVD in the right lower extremity.  His symptoms are consistent with claudication.  I do not think this is consistent with lifestyle limiting claudication at this time since he is still walking 5 to 10 minutes and it does not affect his golf or tennis.  Discussed that we are very conservative with claudication and recommend continued aspirin statin Pletal and continued exercise therapy.  Certainly if this progresses to the point that he cannot live his life we will plan for lower extremity arteriogram and intervention.  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  I am going to order lower extremity arterial duplexes to rule out popliteal aneurysms given that they noted his right popliteal artery was  dilated with some mural thrombus but this was not measured based on a duplex from earlier this year.  I will plan to see him back in 6 months with repeat ABI's.  Discussed he call our office if he has problems before then.   Marty Heck, MD [facility] Office: [phone] Pager: [phone]

## 2019-06-09 ENCOUNTER — Ambulatory Visit (HOSPITAL_COMMUNITY): Payer: Medicare Other | Attending: Internal Medicine

## 2019-06-09 ENCOUNTER — Ambulatory Visit (HOSPITAL_COMMUNITY): Payer: PRIVATE HEALTH INSURANCE

## 2019-06-09 VITALS — Ht 72.0 in | Wt 206.0 lb

## 2019-06-09 DIAGNOSIS — R0789 Other chest pain: Secondary | ICD-10-CM | POA: Diagnosis present

## 2019-06-09 DIAGNOSIS — I7090 Unspecified atherosclerosis: Secondary | ICD-10-CM

## 2019-06-09 LAB — MYOCARDIAL PERFUSION IMAGING
LV dias vol: 90 mL (ref 62–150)
LV sys vol: 32 mL
Peak HR: 112 {beats}/min
Rest HR: 86 {beats}/min
SDS: 0
SRS: 0
SSS: 0
TID: 1.05

## 2019-06-09 LAB — ECHOCARDIOGRAM COMPLETE
Height: 72 in
Weight: 3280 oz

## 2019-06-09 MED ORDER — TECHNETIUM TC 99M TETROFOSMIN IV KIT
10.6000 | PACK | Freq: Once | INTRAVENOUS | Status: AC | PRN
  Administered 2019-06-09: 10.6 via INTRAVENOUS
  Filled 2019-06-09: qty 11

## 2019-06-09 MED ORDER — REGADENOSON 0.4 MG/5ML IV SOLN
0.4000 mg | Freq: Once | INTRAVENOUS | Status: AC
  Administered 2019-06-09: 12:00:00 0.4 mg via INTRAVENOUS

## 2019-06-09 MED ORDER — TECHNETIUM TC 99M TETROFOSMIN IV KIT
32.5000 | PACK | Freq: Once | INTRAVENOUS | Status: AC | PRN
  Administered 2019-06-09: 32.5 via INTRAVENOUS
  Filled 2019-06-09: qty 33

## 2019-06-14 ENCOUNTER — Other Ambulatory Visit: Payer: Self-pay | Admitting: Family Medicine

## 2019-06-14 DIAGNOSIS — E7800 Pure hypercholesterolemia, unspecified: Secondary | ICD-10-CM

## 2019-06-14 DIAGNOSIS — I739 Peripheral vascular disease, unspecified: Secondary | ICD-10-CM

## 2019-06-15 ENCOUNTER — Telehealth: Payer: Self-pay | Admitting: Family Medicine

## 2019-06-15 ENCOUNTER — Other Ambulatory Visit: Payer: Self-pay

## 2019-06-15 DIAGNOSIS — I739 Peripheral vascular disease, unspecified: Secondary | ICD-10-CM

## 2019-06-15 DIAGNOSIS — E7800 Pure hypercholesterolemia, unspecified: Secondary | ICD-10-CM

## 2019-06-15 DIAGNOSIS — I10 Essential (primary) hypertension: Secondary | ICD-10-CM

## 2019-06-15 MED ORDER — METOPROLOL TARTRATE 25 MG PO TABS: 25.0000 mg | ORAL_TABLET | Freq: Every day | ORAL | 1 refills | Status: DC

## 2019-06-15 MED ORDER — ATORVASTATIN CALCIUM 20 MG PO TABS: ORAL_TABLET | ORAL | 3 refills | Status: DC

## 2019-06-15 MED ORDER — CILOSTAZOL 100 MG PO TABS: 100.0000 mg | ORAL_TABLET | Freq: Two times a day (BID) | ORAL | 0 refills | Status: DC

## 2019-06-15 NOTE — Telephone Encounter (Signed)
Patient is calling office and requesting [facility] [place]. Patient informed we need to know where the [facility] and a phone number to call. Patient could not provide information and asked that we find it for him. Patient is requesting a call back at [phone].

## 2019-06-15 NOTE — Telephone Encounter (Signed)
Spoke with patient who was not sure where he wanted Rx sent to and had given me a street of [address] in [place] [facility]. [facility] [place] which was in patients chart.

## 2019-06-16 ENCOUNTER — Encounter: Payer: Self-pay | Admitting: Family Medicine

## 2019-06-16 NOTE — Telephone Encounter (Signed)
Spoke with patient who verbally understood [facility] were [facility] that previous meds were sent in [place] for him due to patient not knowing for sure which [facility] he wanted meds sent to.

## 2019-06-28 ENCOUNTER — Telehealth (INDEPENDENT_AMBULATORY_CARE_PROVIDER_SITE_OTHER): Payer: Medicare HMO | Admitting: Cardiology

## 2019-06-28 ENCOUNTER — Other Ambulatory Visit: Payer: Self-pay

## 2019-06-28 ENCOUNTER — Telehealth: Payer: Self-pay

## 2019-06-28 ENCOUNTER — Encounter: Payer: Self-pay | Admitting: Cardiology

## 2019-06-28 VITALS — BP 130/86 | HR 92 | Ht 72.0 in | Wt 202.0 lb

## 2019-06-28 DIAGNOSIS — I10 Essential (primary) hypertension: Secondary | ICD-10-CM

## 2019-06-28 DIAGNOSIS — Z9889 Other specified postprocedural states: Secondary | ICD-10-CM | POA: Diagnosis not present

## 2019-06-28 DIAGNOSIS — E782 Mixed hyperlipidemia: Secondary | ICD-10-CM | POA: Diagnosis not present

## 2019-06-28 DIAGNOSIS — Z720 Tobacco use: Secondary | ICD-10-CM

## 2019-06-28 DIAGNOSIS — I77810 Thoracic aortic ectasia: Secondary | ICD-10-CM

## 2019-06-28 DIAGNOSIS — Z8679 Personal history of other diseases of the circulatory system: Secondary | ICD-10-CM

## 2019-06-28 NOTE — Addendum Note (Signed)
Addended by: Beckey Rutter on: 06/28/2019 03:22 PM   Modules accepted: Orders

## 2019-06-28 NOTE — Telephone Encounter (Signed)
-----   Message from Jenean Lindau, MD sent at 06/13/2019  2:15 PM EST ----- The results of the study is unremarkable. Please inform patient. I will discuss in detail at next appointment. Cc  primary care/referring physician Jenean Lindau, MD 06/13/2019 2:15 PM

## 2019-06-28 NOTE — Progress Notes (Signed)
Virtual Visit via Telephone Note   This visit type was conducted due to national recommendations for restrictions regarding the COVID-19 Pandemic (e.g. social distancing) in an effort to limit this patient's exposure and mitigate transmission in our community.  Due to his co-morbid illnesses, this patient is at least at moderate risk for complications without adequate follow up.  This format is felt to be most appropriate for this patient at this time.  The patient did not have access to video technology/had technical difficulties with video requiring transitioning to audio format only (telephone).  All issues noted in this document were discussed and addressed.  No physical exam could be performed with this format.  Please refer to the patient's chart for his  consent to telehealth for [facility].   Date:  06/28/2019   ID:  Roger Stewart, DOB 19-Jul-1945, MRN [id number]  Patient Location: Home Provider Location: Office  PCP:  Libby Maw, MD  Cardiologist:  No primary care provider on file.  Electrophysiologist:  None   Evaluation Performed:  Follow-Up Visit  Chief Complaint: Follow-up for aortic repair  History of Present Illness:    Roger Stewart is a 74 y.o. male with past medical history of essential hypertension dyslipidemia and abdominal aortic aneurysm repair.  He is followed by a telephonic call.  He prefers audio and does not want a video call.  Is spending his time in [place] at this time.  He is asymptomatic.  He denies any chest pain orthopnea or PND.  He takes care of activities of daily living.  He continues to smoke 1 cigar a day according to the patient.  At the time of my evaluation, the patient is alert awake oriented and in no distress.  The patient does not have symptoms concerning for COVID-19 infection (fever, chills, cough, or new shortness of breath).    Past Medical History:  Diagnosis Date  . Cancer ([place])    Basal Cell X 8; Dr [place] Crane  . Colon  polyps    Dr Amedeo Plenty  . Diverticulosis of colon   . Hyperlipidemia    elevated TG  . Hypertension   . Sciatica    Past Surgical History:  Procedure Laterality Date  . ABDOMINAL AORTIC ANEURYSM REPAIR  2005  . colonoscopy with polypectomy  2010;2016   Dr Amedeo Plenty  . ESI X3     LS spine  . LUMBAR SPINE SURGERY  04/2010   spur & bulging disc L 4-5, [place] , [place]  08/2010   cyst resected @ op site     Current Meds  Medication Sig  . atorvastatin (LIPITOR) 20 MG tablet TAKE 1 TABLET(20 MG) BY MOUTH DAILY  . cilostazol (PLETAL) 100 MG tablet Take 1 tablet (100 mg total) by mouth 2 (two) times daily.  . diclofenac (VOLTAREN) 25 MG EC tablet Take 1 tablet (25 mg total) by mouth 2 (two) times daily as needed.  Marland Kitchen lisinopril (ZESTRIL) 20 MG tablet Take 1 tablet (20 mg total) by mouth daily.  . metoprolol tartrate (LOPRESSOR) 25 MG tablet Take 1 tablet (25 mg total) by mouth daily.     Allergies:   Norvasc [amlodipine besylate]   Social History   Tobacco Use  . Smoking status: Current Some Day Smoker    Types: Cigars    Last attempt to quit: 06/10/2000    Years since quitting: 19.0  . Smokeless tobacco: Never Used  . Tobacco comment:  Smoked [phone], up  to 3/4 ppd. 05/22/15 1 cigar a day  Substance Use Topics  . Alcohol use: Yes    Alcohol/week: 16.0 standard drinks    Types: 14 Cans of beer, 2 Glasses of wine per week  . Drug use: No     Family Hx: The patient's family history includes Acromegaly in his brother; Heart disease in his mother; Hypertension in his brother and father; Leukemia in his brother; Prostate cancer in his brother and father; Transient ischemic attack in his father. There is no history of Diabetes.  ROS:   Please see the history of present illness.    As mentioned above All other systems reviewed and are negative.   Prior CV studies:   The following studies were reviewed today:  Results of the stress test and echocardiogram were  discussed with the patient at length Study Highlights    Nuclear stress EF: 65%.  The left ventricular ejection fraction is normal (55-65%).  No T wave inversion was noted during stress.  There was no ST segment deviation noted during stress.  This is a low risk study.   No reversible ischemia. LVEF 65% with normal wall motion. This is a low risk study.    IMPRESSIONS    1. Left ventricular ejection fraction, by visual estimation, is 60 to 65%. There is no left ventricular hypertrophy.  2. Left ventricular diastolic parameters are consistent with Grade I diastolic dysfunction (impaired relaxation).  3. Left atrial size was normal.  4. Right atrial size was normal.  5. The mitral valve is normal in structure. No evidence of mitral valve regurgitation.  6. The tricuspid valve is normal in structure.  7. The aortic valve is normal in structure. Aortic valve regurgitation is not visualized. Mild aortic valve sclerosis without stenosis.  8. There is mild to moderate dilatation of the ascending aorta measuring 40 mm.   Labs/Other Tests and Data Reviewed:    EKG:  Previous EKG was reviewed  Recent Labs: 03/18/2019: Hemoglobin 13.1; Platelets 187.0 05/31/2019: ALT 15; BUN 17; Creatinine, Ser 0.82; Potassium 4.5; Sodium 138   Recent Lipid Panel Lab Results  Component Value Date/Time   CHOL 222 (H) 05/27/2018 10:53 AM   CHOL 251 (H) 05/18/2014 12:01 PM   TRIG 108.0 05/27/2018 10:53 AM   TRIG 184 (H) 05/18/2014 12:01 PM   TRIG 175 (H) 05/14/2006 10:31 AM   HDL 66.80 05/27/2018 10:53 AM   HDL 63 05/18/2014 12:01 PM   CHOLHDL 3 05/27/2018 10:53 AM   LDLCALC 134 (H) 05/27/2018 10:53 AM   LDLCALC 151 (H) 05/18/2014 12:01 PM   LDLDIRECT 54.0 05/31/2019 11:10 AM    Wt Readings from Last 3 Encounters:  06/28/19 202 lb (91.6 kg)  06/09/19 206 lb (93.4 kg)  06/08/19 205 lb (93 kg)     Objective:    Vital Signs:  BP 130/86   Pulse 92   Ht 6' (1.829 m)   Wt 202 lb  (91.6 kg)   BMI 27.40 kg/m    VITAL SIGNS:  reviewed  ASSESSMENT & PLAN:    1. Abdominal aortic aneurysm: Post repair patient doing well.  Secondary prevention stressed.  Importance of compliance with diet and medication stressed and he vocalized understanding. 2. Essential hypertension: Blood pressure stable 3. Mixed dyslipidemia: Diet was discussed with the patient and he is on statin therapy and doing well.  Lipids reviewed 4. I discussed with him about abdominal aortic aneurysm and I would like to do a scan when he sees  me back in 3 months is asymptomatic.  Also echocardiogram mentions aortic root to be 4 cm and I would like it to be assessed when he sees me in 3 months.  He is in [place] right now and does not want any evaluation.  I do not see any urgency for it he is asymptomatic. 5. Patient will be seen in follow-up appointment in 6 months or earlier if the patient has any concerns 6. I told him to abstain from any form of tobacco abuse including cigarettes and he promises to do it.  COVID-19 Education: The signs and symptoms of COVID-19 were discussed with the patient and how to seek care for testing (follow up with PCP or arrange E-visit).  The importance of social distancing was discussed today.  Time:   Today, I have spent 15 minutes with the patient with telehealth technology discussing the above problems.     Medication Adjustments/Labs and Tests Ordered: Current medicines are reviewed at length with the patient today.  Concerns regarding medicines are outlined above.   Tests Ordered: No orders of the defined types were placed in this encounter.   Medication Changes: No orders of the defined types were placed in this encounter.   Follow Up:  In Person in 4 month(s)  Signed, Jenean Lindau, MD  06/28/2019 9:10 AM    [place]

## 2019-06-28 NOTE — Patient Instructions (Signed)

## 2019-06-28 NOTE — Telephone Encounter (Signed)
Results relayed, copy sent to Dr. Ethelene Hal

## 2019-08-09 ENCOUNTER — Other Ambulatory Visit: Payer: Self-pay

## 2019-08-09 ENCOUNTER — Other Ambulatory Visit: Payer: Self-pay | Admitting: Family Medicine

## 2019-08-09 DIAGNOSIS — I739 Peripheral vascular disease, unspecified: Secondary | ICD-10-CM

## 2019-08-09 MED ORDER — CILOSTAZOL 100 MG PO TABS: 100.0000 mg | ORAL_TABLET | Freq: Two times a day (BID) | ORAL | 0 refills | Status: DC

## 2019-08-09 NOTE — Telephone Encounter (Signed)
Pt called just to make sure you knew he was in [place] and needed [facility]. Pt number: [phone]

## 2019-08-09 NOTE — Telephone Encounter (Signed)
Patient would like medication sent to [facility] pharmacy[facility] [place] [facility] changed and pending for your approval

## 2019-08-10 NOTE — Telephone Encounter (Signed)
Rx was sent/thx dmf

## 2019-08-10 NOTE — Telephone Encounter (Signed)
Okay to send. Please ask him to be SPECIFIC about where he would like rxs to be sent.

## 2019-09-02 ENCOUNTER — Telehealth: Payer: Self-pay | Admitting: Family Medicine

## 2019-09-02 NOTE — Telephone Encounter (Signed)
Roger Stewart is calling and wanted to see if Dr. Ethelene Hal received a medical release form that was faxed over for patient. CB is [phone]

## 2019-09-06 ENCOUNTER — Other Ambulatory Visit: Payer: Self-pay | Admitting: Family Medicine

## 2019-09-06 DIAGNOSIS — I739 Peripheral vascular disease, unspecified: Secondary | ICD-10-CM

## 2019-09-06 NOTE — Telephone Encounter (Signed)
Roger Stewart is calling back from [place] to see if a medical release form was received for patient. CB is [phone]

## 2019-09-08 NOTE — Telephone Encounter (Signed)
Form faxed today

## 2019-09-13 ENCOUNTER — Other Ambulatory Visit: Payer: Self-pay

## 2019-09-13 ENCOUNTER — Encounter: Payer: Self-pay | Admitting: Family Medicine

## 2019-09-13 MED ORDER — LISINOPRIL 20 MG PO TABS: 20.0000 mg | ORAL_TABLET | Freq: Every day | ORAL | 0 refills | Status: DC

## 2019-10-06 ENCOUNTER — Ambulatory Visit: Payer: Medicare HMO | Admitting: *Deleted

## 2019-10-06 ENCOUNTER — Ambulatory Visit: Payer: Medicare HMO | Admitting: Family Medicine

## 2019-10-07 ENCOUNTER — Telehealth: Payer: Self-pay | Admitting: Family Medicine

## 2019-10-07 NOTE — Progress Notes (Signed)
°  Chronic Care Management   Note  10/07/2019 Name: Roger Stewart MRN: [id number] DOB: Sep 10, 1945  Roger Stewart is a 74 y.o. year old male who is a primary care patient of Libby Maw, MD. I reached out to [name] by phone today in response to a referral sent by [name]'s PCP, Libby Maw, MD.   [name] was given information about Chronic Care Management services today including:  1. CCM service includes personalized support from designated clinical staff supervised by his physician, including individualized plan of care and coordination with other care providers 2. 24/7 contact phone numbers for assistance for urgent and routine care needs. 3. Service will only be billed when office clinical staff spend 20 minutes or more in a month to coordinate care. 4. Only one practitioner may furnish and bill the service in a calendar month. 5. The patient may stop CCM services at any time (effective at the end of the month) by phone call to the office staff.   Patient agreed to services and verbal consent obtained.   This note is not being shared with the patient for the following reason: To respect privacy (The patient or proxy has requested that the information not be shared).  Follow up plan:   Earney Hamburg Upstream Scheduler

## 2019-10-22 NOTE — Addendum Note (Signed)
Addended by: Lynda Rainwater on: 10/22/2019 01:50 PM   Modules accepted: Orders

## 2019-10-22 NOTE — Chronic Care Management (AMB) (Signed)
Chronic Care Management Pharmacy  Name: Roger Stewart  MRN: [id number] DOB: [birth year]  Chief Complaint/ HPI  [name],  74 y.o. , male presents for their Initial CCM visit with the clinical pharmacist via telephone due to COVID-19 Pandemic.  PCP : Libby Maw, MD  Their chronic conditions include: Hypertension, PVD, hyperlipidemia, gout, tobacco abuse, chronic pain  Office Visits: 05/31/19: Patient presented to Dr. Ethelene Hal for follow-up. Unable to tolerate amlodipine. No medication changes made.   Consult Visit: 06/28/19: Televisit with Dr. Geraldo Pitter (Cardiology). Patient reports smoking 1 cigar daily. No medication changes noted.  06/08/19: Patient presented to Dr. Carlis Abbott for evaluation of PVD. Symptoms consistent with claudication. No medication changes noted.  Acute illness Heartburn starting Tuesday. Diarrhea through Saturday. Lost 10 pounds over the past week.   Medications: Outpatient Encounter Medications as of 10/26/2019  Medication Sig  . atorvastatin (LIPITOR) 20 MG tablet TAKE 1 TABLET(20 MG) BY MOUTH DAILY  . cilostazol (PLETAL) 100 MG tablet Take 1 tablet (100 mg total) by mouth 2 (two) times daily.  Marland Kitchen lisinopril (ZESTRIL) 20 MG tablet Take 1 tablet (20 mg total) by mouth daily.  . metoprolol tartrate (LOPRESSOR) 25 MG tablet Take 1 tablet (25 mg total) by mouth daily.  Marland Kitchen OVER THE COUNTER MEDICATION Take 25 mg by mouth every other day as needed (Pain). CBD Oil  . allopurinol (ZYLOPRIM) 100 MG tablet   . diclofenac (VOLTAREN) 25 MG EC tablet Take 1 tablet (25 mg total) by mouth 2 (two) times daily as needed. (Patient not taking: Reported on 10/26/2019)  . diclofenac sodium (VOLTAREN) 1 % GEL Apply 2 g topically 4 (four) times daily. (Patient not taking: Reported on 06/28/2019)  . predniSONE (DELTASONE) 10 MG tablet Take 1 tablet (10 mg total) by mouth daily with breakfast. (Patient not taking: Reported on 06/03/2019)  . traMADol (ULTRAM) 50 MG tablet Take 1  tablet (50 mg total) by mouth every 8 (eight) hours as needed. (Patient not taking: Reported on 06/03/2019)   No facility-administered encounter medications on file as of 10/26/2019.   Current Diagnosis/Assessment:  Goals Addressed            This Visit's Progress   . Chronic Care Management       CARE PLAN ENTRY  Current Barriers:  . Chronic Disease Management support, education, and care coordination needs related to Hypertension and Hyperlipidemia   Hypertension . Pharmacist Clinical Goal(s): o Over the next 90 days, patient will work with PharmD and providers to maintain BP goal <140/90 . Current regimen:  o Lisinopril 20 mg  o Metoprolol Tartrate 25 mg  . Patient self care activities - Over the next 90 days, patient will: o Purchase new blood pressure monitor and resume checking blood pressure weekly, document, and provide at future appointments o Ensure daily salt intake < 2300 mg/day  Hyperlipidemia . Pharmacist Clinical Goal(s): o Over the next 90 days, patient will work with PharmD and providers to maintain LDL goal < 70 . Current regimen:  o Atorvastatin 20 mg  Medication management . Pharmacist Clinical Goal(s): o Over the next 180 days, patient will work with PharmD and providers to achieve optimal medication adherence . Current [facility] . Interventions o Comprehensive medication review performed. o Continue current medication management strategy . Patient self care activities - Over the next 180 days, patient will: o Take medications as prescribed o Report any questions or concerns to PharmD and/or provider(s)      Hypertension  BP today is:  <140/90  Office blood pressures are  BP Readings from Last 3 Encounters:  06/28/19 130/86  06/08/19 133/89  06/03/19 118/82   CMP Latest Ref Rng & Units 05/31/2019 03/18/2019 05/27/2018  Glucose 70 - 99 mg/dL 117(H) 99 104(H)  BUN 6 - 23 mg/dL 17 12 14   Creatinine 0.40 - 1.50 mg/dL 0.82 0.71 0.84    Sodium 135 - 145 mEq/L 138 138 138  Potassium 3.5 - 5.1 mEq/L 4.5 4.6 5.0  Chloride 96 - 112 mEq/L 103 103 100  CO2 19 - 32 mEq/L 27 29 30   Calcium 8.4 - 10.5 mg/dL 9.4 9.4 9.3  Total Protein 6.0 - 8.3 g/dL 6.4 6.9 7.0  Total Bilirubin 0.2 - 1.2 mg/dL 0.6 0.6 0.6  Alkaline Phos 39 - 117 U/L 81 79 71  AST 0 - 37 U/L 17 20 15   ALT 0 - 53 U/L 15 19 12    Patient has failed these meds in the past: Amlodipine (feel bad)  Patient is currently controlled on the following medications:   Lisinopril 20 mg   Metoprolol 25 mg daily  Patient checks BP at home weekly  Patient home BP readings are ranging: 110/68, 124/78, mostly [id number] systolic's   We discussed diet and exercise extensively Patient reports frequent instances of dizziness and lightheadedness associated with standing up to fast. He states he experiences episodes of orthostatic hypotension every 2-3 days. Last time it occurred and he checked his BP it was 110/68.  He does salt food sometimes. Stretching exercises daily, golfs 2-3x weekly. Not walking as much due to claudication, but was previously walking ~1 mile daily.  Plan  Recommend stopping metoprolol tartrate to minimize risk of orthostatic hypotension and due to lack of clear cardiovascular indication.   Hyperlipidemia / Peripheral Vascular Disease   Intermittent claudication in the right calf Aortic Aneurysm (repair 2005)  Lipid Panel     Component Value Date/Time   CHOL 222 (H) 05/27/2018 1053   CHOL 251 (H) 05/18/2014 1201   TRIG 108.0 05/27/2018 1053   TRIG 184 (H) 05/18/2014 1201   TRIG 175 (H) 05/14/2006 1031   HDL 66.80 05/27/2018 1053   HDL 63 05/18/2014 1201   CHOLHDL 3 05/27/2018 1053   VLDL 21.6 05/27/2018 1053   LDLCALC 134 (H) 05/27/2018 1053   LDLCALC 151 (H) 05/18/2014 1201   LDLDIRECT 54.0 05/31/2019 1110     The 10-year ASCVD risk score Mikey Bussing DC Jr., et al., 2013) is: 28.4%   Values used to calculate the score:     Age: 54 years     Sex: Male      Is Non-Hispanic African American: No     Diabetic: No     Tobacco smoker: Yes     Systolic Blood Pressure: [id number]     Is BP treated: Yes     HDL Cholesterol: 66.8 mg/dL     Total Cholesterol: 222 mg/dL   Patient has failed these meds in past: n/a Patient is currently uncontrolled on the following medications:   Atorvastatin 20 mg daily   Cilostazole 100 mg BID    We discussed:  Patient states cilostazole has helped his claudication somewhat, but it is still fairly limiting and cramps regularly. Tolerating atorvastatin well.   Plan  Continue current medications  Recommend follow-up Lipid Panel and increasing atorvastatin if LDL >70.   Tobacco Abuse   Tobacco Status:  Social History   Tobacco Use  Smoking Status Current Every Day Smoker  .  Types: Cigars  . Last attempt to quit: 06/10/2000  . Years since quitting: 19.3  Smokeless Tobacco Never Used  Tobacco Comment    Smoked [phone], up to 3/4 ppd. 05/22/15 1 cigar a day. 10/26/19 2-4 cigars daily    Patient smokes After 30 minutes of waking Patient triggers include: watching television, finishing a meal and after golfing On a scale of 1-10, reports MOTIVATION to quit is 7 On a scale of 1-10, reports CONFIDENCE in quitting is 3  Patient has failed these meds in past: n/a Patient is currently uncontrolled on the following medications: none  We discussed: Patient did not think he was ready to quit smoking within the next 30 days. Currently smoking 2-4 cigars daily depending on how busy he gets, although he states he does not breath in the smoke from the cigar. Counseled patient on the importance of smoking cessation to decrease risk of cardiovascular events and due to patient's history of PVD.   Plan  Continue current medications  Gout   No results found for: [facility]   Patient has failed these meds in past: Allopurinol (no longer needed), Gouch (cherry tart + quercetin)  Patient is currently controlled on the  following medications: none   We discussed:  Two gouts in past two years. Patient treated flares with allopurinol/Gouch supplement, but no longer taking anything to prevent further gout flares. Counseled on importance of low-purine diet.  Plan  Continue current medications  Recommend uric acid lab to evaluate need for restarting allopurinol.   Chronic Pain   Back pain  Patient has failed these meds in past: Tramadol,  Patient is currently managed on the following medications:   Diclofenac 25 mg BID PRN (rarely uses)  Diclofenac 1% gel QID (rarely uses)  CBD Oil  We discussed:  Well controlled most days.   Plan  Continue current medications  Misc/OTC   Co Q10 100 mg  Vitamin D3 2000 iu Testosterone daily   Multivitamin daily  Glutathione daily  Zyrtec 10 mg daily   Plan  Continue current medications   Vaccines   Reviewed and discussed patient's vaccination history.    Immunization History  Administered Date(s) Administered  . Fluad Quad(high Dose 65+) 03/18/2019  . Influenza, High Dose Seasonal PF 05/26/2017  . Td 06/11/1999, 05/10/2010  . Zoster Recombinat (Shingrix) 02/09/2019   Medication Management   [facility]. There is a >5 fill gap on Atorvastatin.  Uses pill box? No - Keeps organized in shaving bag  Plan  Continue current medication management strategy  Follow up: 3 month phone visit  [place] [facility]  [phone]

## 2019-10-26 ENCOUNTER — Ambulatory Visit: Payer: Medicare HMO

## 2019-10-26 DIAGNOSIS — E782 Mixed hyperlipidemia: Secondary | ICD-10-CM

## 2019-10-26 DIAGNOSIS — I10 Essential (primary) hypertension: Secondary | ICD-10-CM

## 2019-10-26 NOTE — Patient Instructions (Addendum)
Visit Information It was great speaking with you today!  Please let me know if you have any questions about our visit.  Goals Addressed            This Visit's Progress   . Chronic Care Management       CARE PLAN ENTRY  Current Barriers:  . Chronic Disease Management support, education, and care coordination needs related to Hypertension and Hyperlipidemia   Hypertension . Pharmacist Clinical Goal(s): o Over the next 90 days, patient will work with PharmD and providers to maintain BP goal <140/90 . Current regimen:  o Lisinopril 20 mg  o Metoprolol Tartrate 25 mg  . Patient self care activities - Over the next 90 days, patient will: o Purchase new blood pressure monitor and resume checking blood pressure weekly, document, and provide at future appointments o Ensure daily salt intake < 2300 mg/day  Hyperlipidemia . Pharmacist Clinical Goal(s): o Over the next 90 days, patient will work with PharmD and providers to maintain LDL goal < 70 . Current regimen:  o Atorvastatin 20 mg  Medication management . Pharmacist Clinical Goal(s): o Over the next 180 days, patient will work with PharmD and providers to achieve optimal medication adherence . [facility] . Interventions o Comprehensive medication review performed. o Continue current medication management strategy . Patient self care activities - Over the next 180 days, patient will: o Take medications as prescribed o Report any questions or concerns to PharmD and/or provider(s)       Roger Stewart was given information about Chronic Care Management services today including:  1. CCM service includes personalized support from designated clinical staff supervised by his physician, including individualized plan of care and coordination with other care providers 2. 24/7 contact phone numbers for assistance for urgent and routine care needs. 3. Standard insurance, coinsurance, copays and deductibles apply for chronic care  management only during months in which we provide at least 20 minutes of these services. Most insurances cover these services at 100%, however patients may be responsible for any copay, coinsurance and/or deductible if applicable. This service may help you avoid the need for more expensive face-to-face services. 4. Only one practitioner may furnish and bill the service in a calendar month. 5. The patient may stop CCM services at any time (effective at the end of the month) by phone call to the office staff.  Patient agreed to services and verbal consent obtained.   The patient verbalized understanding of instructions provided today and agreed to receive a mailed copy of patient instruction and/or educational materials. Telephone follow up appointment with pharmacy team member scheduled for: 01/26/20 at 10:00 AM   [place] [facility] [place] A low-purine eating plan involves making food choices to limit your intake of purine. Purine is a kind of uric acid. Too much uric acid in your blood can cause certain conditions, such as gout and kidney stones. Eating a low-purine diet can help control these conditions. What are tips for following this plan? Reading food labels   Avoid foods with saturated or Trans fat.  Check the ingredient list of grains-based foods, such as bread and cereal, to make sure that they contain whole grains.  Check the ingredient list of sauces or soups to make sure they do not contain meat or fish.  When choosing soft drinks, check the ingredient list to make sure they do not contain high-fructose corn syrup. Shopping  Buy plenty  of fresh fruits and vegetables.  Avoid buying canned or fresh fish.  Buy dairy products labeled as low-fat or nonfat.  Avoid buying premade or processed foods. These foods are often high in fat, salt (sodium), and added sugar. Cooking  Use olive oil instead of  butter when cooking. Oils like olive oil, canola oil, and sunflower oil contain healthy fats. Meal planning  Learn which foods do or do not affect you. If you find out that a food tends to cause your gout symptoms to flare up, avoid eating that food. You can enjoy foods that do not cause problems. If you have any questions about a food item, talk with your dietitian or health care provider.  Limit foods high in fat, especially saturated fat. Fat makes it harder for your body to get rid of uric acid.  Choose foods that are lower in fat and are lean sources of protein. General guidelines  Limit alcohol intake to no more than 1 drink a day for nonpregnant women and 2 drinks a day for men. One drink equals 12 oz of beer, 5 oz of wine, or 1 oz of hard liquor. Alcohol can affect the way your body gets rid of uric acid.  Drink plenty of water to keep your urine clear or pale yellow. Fluids can help remove uric acid from your body.  If directed by your health care provider, take a vitamin C supplement.  Work with your health care provider and dietitian to develop a plan to achieve or maintain a healthy weight. Losing weight can help reduce uric acid in your blood. What foods are recommended? The items listed may not be a complete list. Talk with your dietitian about what dietary choices are best for you. Foods low in purines Foods low in purines do not need to be limited. These include:  All fruits.  All low-purine vegetables, pickles, and olives.  Breads, pasta, rice, cornbread, and popcorn. Cake and other baked goods.  All dairy foods.  Eggs, nuts, and nut butters.  Spices and condiments, such as salt, herbs, and vinegar.  Plant oils, butter, and margarine.  Water, sugar-free soft drinks, tea, coffee, and cocoa.  Vegetable-based soups, broths, sauces, and gravies. Foods moderate in purines Foods moderate in purines should be limited to the amounts listed.   cup of asparagus,  cauliflower, spinach, mushrooms, or green peas, each day.  2/3 cup uncooked oatmeal, each day.   cup dry wheat bran or wheat germ, each day.  2-3 ounces of meat or poultry, each day.  4-6 ounces of shellfish, such as crab, lobster, oysters, or shrimp, each day.  1 cup cooked beans, peas, or lentils, each day.  Soup, broths, or bouillon made from meat or fish. Limit these foods as much as possible. What foods are not recommended? The items listed may not be a complete list. Talk with your dietitian about what dietary choices are best for you. Limit your intake of foods high in purines, including:  Beer and other alcohol.  Meat-based gravy or sauce.  Canned or fresh fish, such as: ? Anchovies, sardines, herring, and tuna. ? Mussels and scallops. ? Codfish, trout, and haddock.  Berniece Salines.  Organ meats, such as: ? Liver or kidney. ? Tripe. ? Sweetbreads (thymus gland or pancreas).  Wild Clinical biochemist.  Yeast or yeast extract supplements.  Drinks sweetened with high-fructose corn syrup. Summary  Eating a low-purine diet can help control conditions caused by too much uric acid in the body,  such as gout or kidney stones.  Choose low-purine foods, limit alcohol, and limit foods high in fat.  You will learn over time which foods do or do not affect you. If you find out that a food tends to cause your gout symptoms to flare up, avoid eating that food. This information is not intended to replace advice given to you by your health care provider. Make sure you discuss any questions you have with your health care provider. Document Revised: 05/09/2017 Document Reviewed: 07/10/2016 Elsevier Patient Education  2020 [employer].

## 2019-11-05 ENCOUNTER — Telehealth: Payer: Self-pay | Admitting: Family Medicine

## 2019-11-05 NOTE — Telephone Encounter (Signed)
I called pt because we had received back through the mail his AVS from 10/26/19 that Alex had tried to mail to him, It said return to sender. Pt said that nothing could be mailed to him at the moment because he sold his house and didn't have an official address as of yet. I did let him know that the AVS should be able to be accessed through mychart, He said he was fine with that, I told him if there was any issues to give [place] a call

## 2019-11-29 ENCOUNTER — Encounter: Payer: Self-pay | Admitting: Family Medicine

## 2019-11-29 ENCOUNTER — Other Ambulatory Visit: Payer: Self-pay

## 2019-11-29 ENCOUNTER — Ambulatory Visit (INDEPENDENT_AMBULATORY_CARE_PROVIDER_SITE_OTHER): Payer: Medicare HMO | Admitting: Family Medicine

## 2019-11-29 VITALS — BP 112/74 | HR 87 | Temp 97.6°F | Ht 72.0 in | Wt 200.6 lb

## 2019-11-29 DIAGNOSIS — E782 Mixed hyperlipidemia: Secondary | ICD-10-CM | POA: Diagnosis not present

## 2019-11-29 DIAGNOSIS — I739 Peripheral vascular disease, unspecified: Secondary | ICD-10-CM | POA: Diagnosis not present

## 2019-11-29 DIAGNOSIS — Z720 Tobacco use: Secondary | ICD-10-CM

## 2019-11-29 DIAGNOSIS — Z8739 Personal history of other diseases of the musculoskeletal system and connective tissue: Secondary | ICD-10-CM

## 2019-11-29 DIAGNOSIS — I10 Essential (primary) hypertension: Secondary | ICD-10-CM

## 2019-11-29 LAB — LDL CHOLESTEROL, DIRECT: Direct LDL: 52 mg/dL

## 2019-11-29 LAB — LIPID PANEL
Cholesterol: 130 mg/dL (ref 0–200)
HDL: 63 mg/dL (ref >39.00)
LDL Cholesterol: 56 mg/dL (ref 0–99)
NonHDL: 67.32
Total CHOL/HDL Ratio: 2
Triglycerides: 58 mg/dL (ref 0.0–149.0)
VLDL: 11.6 mg/dL (ref 0.0–40.0)

## 2019-11-29 LAB — COMPREHENSIVE METABOLIC PANEL
ALT: 12 U/L (ref 0–53)
AST: 16 U/L (ref 0–37)
Albumin: 3.5 g/dL (ref 3.5–5.2)
Alkaline Phosphatase: 68 U/L (ref 39–117)
BUN: 18 mg/dL (ref 6–23)
CO2: 29 mEq/L (ref 19–32)
Calcium: 8.6 mg/dL (ref 8.4–10.5)
Chloride: 105 mEq/L (ref 96–112)
Creatinine, Ser: 0.81 mg/dL (ref 0.40–1.50)
GFR: 92.96 mL/min (ref >60.00)
Glucose, Bld: 115 mg/dL — ABNORMAL HIGH (ref 70–99)
Potassium: 4 mEq/L (ref 3.5–5.1)
Sodium: 141 mEq/L (ref 135–145)
Total Bilirubin: 0.7 mg/dL (ref 0.2–1.2)
Total Protein: 5.9 g/dL — ABNORMAL LOW (ref 6.0–8.3)

## 2019-11-29 LAB — URINALYSIS, ROUTINE W REFLEX MICROSCOPIC
Hgb urine dipstick: NEGATIVE
Leukocytes,Ua: NEGATIVE
Nitrite: NEGATIVE
RBC / HPF: NONE SEEN (ref 0–?)
Specific Gravity, Urine: >=1.03 — AB (ref 1.000–1.030)
Total Protein, Urine: NEGATIVE
Urine Glucose: NEGATIVE
Urobilinogen, UA: 0.2 (ref 0.0–1.0)
pH: 6 (ref 5.0–8.0)

## 2019-11-29 LAB — CBC
HCT: 36.9 % — ABNORMAL LOW (ref 39.0–52.0)
Hemoglobin: 12.5 g/dL — ABNORMAL LOW (ref 13.0–17.0)
MCHC: 33.8 g/dL (ref 30.0–36.0)
MCV: 95 fl (ref 78.0–100.0)
Platelets: 200 10*3/uL (ref 150.0–400.0)
RBC: 3.88 Mil/uL — ABNORMAL LOW (ref 4.22–5.81)
RDW: 15 % (ref 11.5–15.5)
WBC: 7.2 10*3/uL (ref 4.0–10.5)

## 2019-11-29 LAB — URIC ACID: Uric Acid, Serum: 5.9 mg/dL (ref 4.0–7.8)

## 2019-11-29 NOTE — Progress Notes (Signed)
Established Patient Office Visit  Subjective:  Patient ID: Roger Stewart, male    DOB: [birth year]  Age: 74 y.o. MRN: [id number]  CC:  Chief Complaint  Patient presents with  . Follow-up    6 month follow up on BP and labs, patient states that he recently had a stomach bug he would like to discuss.     HPI MARLOS CARMEN presents for follow-up of his hypertension, peripheral vascular disease and elevated cholesterol.  Blood pressure well controlled with current regimen.  Continues to have claudication type pain now in his left leg when he walks for extended distances.  Continues to smoke cigars.  Urine flow has been okay.  Suffered a gastroenteritis a week or so ago.  Had 2 to 3 days of nausea with vomiting and watery diarrhea.  Symptoms have mostly mostly cleared.  Has occasional loose stool without blood or pus.  Uses Voltaren tablets rarely for back aches and pains.  Past Medical History:  Diagnosis Date  . Cancer ([place])    Basal Cell X 8; Dr Nevada Crane  . Colon polyps    Dr Amedeo Plenty  . Diverticulosis of colon   . Hyperlipidemia    elevated TG  . Hypertension   . Sciatica     Past Surgical History:  Procedure Laterality Date  . ABDOMINAL AORTIC ANEURYSM REPAIR  2005  . colonoscopy with polypectomy  2010;2016   Dr Amedeo Plenty  . ESI X3     LS spine  . LUMBAR SPINE SURGERY  04/2010   spur & bulging disc L 4-5, [place] , [place]  08/2010   cyst resected @ op site    Family History  Problem Relation Age of Onset  . Heart disease Mother        CHF ,CABG  . Prostate cancer Father   . Hypertension Father   . Transient ischemic attack Father        in 48s  . Hypertension Brother   . Acromegaly Brother   . Prostate cancer Brother   . Leukemia Brother   . Diabetes Neg Hx     Social History   Socioeconomic History  . Marital status: Legally Separated    Spouse name: Not on file  . Number of children: 1  . Years of education: 29  . Highest education level:  Not on file  Occupational History  . Not on file  Tobacco Use  . Smoking status: Current Every Day Smoker    Types: Cigars    Last attempt to quit: 06/10/2000    Years since quitting: 19.4  . Smokeless tobacco: Never Used  . Tobacco comment:  Smoked [phone], up to 3/4 ppd. 05/22/15 1 cigar a day. 10/26/19 2-4 cigars daily  Vaping Use  . Vaping Use: Never used  Substance and Sexual Activity  . Alcohol use: Yes    Alcohol/week: 16.0 standard drinks    Types: 2 Glasses of wine, 14 Cans of beer per week  . Drug use: No  . Sexual activity: Not on file  Other Topics Concern  . Not on file  Social History Narrative   Denies abuse and feels safe at home.    Social Determinants of Health   Financial Resource Strain:   . Difficulty of Paying Living Expenses:   Food Insecurity:   . Worried About Charity fundraiser in the Last Year:   . Arboriculturist in the Last Year:   [employer]  Needs: No Transportation Needs  . Lack of Transportation (Medical): No  . Lack of Transportation (Non-Medical): No  Physical Activity:   . Days of Exercise per Week:   . Minutes of Exercise per Session:   Stress:   . Feeling of Stress :   Social Connections:   . Frequency of Communication with Friends and Family:   . Frequency of Social Gatherings with Friends and Family:   . Attends Religious Services:   . Active Member of Clubs or Organizations:   . Attends Archivist Meetings:   Marland Kitchen Marital Status:   Intimate Partner Violence:   . Fear of Current or Ex-Partner:   . Emotionally Abused:   Marland Kitchen Physically Abused:   . Sexually Abused:     Outpatient Medications Prior to Visit  Medication Sig Dispense Refill  . atorvastatin (LIPITOR) 20 MG tablet TAKE 1 TABLET(20 MG) BY MOUTH DAILY 90 tablet 3  . cilostazol (PLETAL) 100 MG tablet Take 1 tablet (100 mg total) by mouth 2 (two) times daily. 180 tablet 0  . diclofenac (VOLTAREN) 25 MG EC tablet Take 1 tablet (25 mg total) by mouth 2 (two)  times daily as needed. 180 tablet 0  . lisinopril (ZESTRIL) 20 MG tablet Take 1 tablet (20 mg total) by mouth daily. 90 tablet 0  . metoprolol tartrate (LOPRESSOR) 25 MG tablet Take 1 tablet (25 mg total) by mouth daily. 180 tablet 1  . allopurinol (ZYLOPRIM) 100 MG tablet  (Patient not taking: Reported on 11/29/2019)    . diclofenac sodium (VOLTAREN) 1 % GEL Apply 2 g topically 4 (four) times daily. (Patient not taking: Reported on 06/28/2019) 100 g 1  . OVER THE COUNTER MEDICATION Take 25 mg by mouth every other day as needed (Pain). CBD Oil (Patient not taking: Reported on 11/29/2019)    . predniSONE (DELTASONE) 10 MG tablet Take 1 tablet (10 mg total) by mouth daily with breakfast. (Patient not taking: Reported on 06/03/2019) 5 tablet 0  . traMADol (ULTRAM) 50 MG tablet Take 1 tablet (50 mg total) by mouth every 8 (eight) hours as needed. (Patient not taking: Reported on 06/03/2019) 30 tablet 0   No facility-administered medications prior to visit.    Allergies  Allergen Reactions  . Norvasc [Amlodipine Besylate] Other (See Comments)    Body aches    ROS Review of Systems  Constitutional: Negative.   HENT: Negative.   Eyes: Negative for photophobia and visual disturbance.  Respiratory: Negative.  Negative for chest tightness and shortness of breath.   Cardiovascular: Negative for chest pain and palpitations.  Gastrointestinal: Negative for anal bleeding, blood in stool, diarrhea, nausea and vomiting.  Endocrine: Negative for polyuria.  Genitourinary: Negative.  Negative for difficulty urinating, frequency and urgency.  Musculoskeletal: Positive for back pain.  Skin: Negative for pallor and rash.  Allergic/Immunologic: Negative for immunocompromised state.  Neurological: Negative for light-headedness and numbness.  Hematological: Does not bruise/bleed easily.  Psychiatric/Behavioral: Negative.       Objective:    Physical Exam Vitals and nursing note reviewed.    Constitutional:      General: He is not in acute distress.    Appearance: Normal appearance. He is normal weight. He is not ill-appearing, toxic-appearing or diaphoretic.  HENT:     Head: Normocephalic and atraumatic.     Right Ear: External ear normal.     Left Ear: External ear normal.  Eyes:     General: No scleral icterus.  Right eye: No discharge.        Left eye: No discharge.     Extraocular Movements: Extraocular movements intact.     Conjunctiva/sclera: Conjunctivae normal.     Pupils: Pupils are equal, round, and reactive to light.  Cardiovascular:     Rate and Rhythm: Normal rate and regular rhythm.     Pulses:          Dorsalis pedis pulses are 0 on the right side and 2+ on the left side.       Posterior tibial pulses are 0 on the right side and 1+ on the left side.  Pulmonary:     Effort: Pulmonary effort is normal.     Breath sounds: Normal breath sounds.  Musculoskeletal:     Right lower leg: No edema.     Left lower leg: No edema.  Skin:    General: Skin is warm and dry.  Neurological:     Mental Status: He is alert and oriented to person, place, and time.  Psychiatric:        Mood and Affect: Mood normal.        Behavior: Behavior normal.     BP 112/74   Pulse 87   Temp 97.6 F (36.4 C) (Tympanic)   Ht 6' (1.829 m)   Wt 200 lb 9.6 oz (91 kg)   SpO2 94%   BMI 27.21 kg/m  Wt Readings from Last 3 Encounters:  11/29/19 200 lb 9.6 oz (91 kg)  06/28/19 202 lb (91.6 kg)  06/09/19 206 lb (93.4 kg)     Health Maintenance Due  Topic Date Due  . COVID-19 Vaccine (1) Never done  . PNA vac Low Risk Adult (1 of 2 - PCV13) Never done    There are no preventive care reminders to display for this patient.  Lab Results  Component Value Date   TSH 1.86 05/27/2018   Lab Results  Component Value Date   WBC 6.8 03/18/2019   HGB 13.1 03/18/2019   HCT 38.9 (L) 03/18/2019   MCV 98.0 03/18/2019   PLT 187.0 03/18/2019   Lab Results  Component Value  Date   NA 138 05/31/2019   K 4.5 05/31/2019   CO2 27 05/31/2019   GLUCOSE 117 (H) 05/31/2019   BUN 17 05/31/2019   CREATININE 0.82 05/31/2019   BILITOT 0.6 05/31/2019   ALKPHOS 81 05/31/2019   AST 17 05/31/2019   ALT 15 05/31/2019   PROT 6.4 05/31/2019   ALBUMIN 3.8 05/31/2019   CALCIUM 9.4 05/31/2019   GFR 91.78 05/31/2019   Lab Results  Component Value Date   CHOL 222 (H) 05/27/2018   Lab Results  Component Value Date   HDL 66.80 05/27/2018   Lab Results  Component Value Date   LDLCALC 134 (H) 05/27/2018   Lab Results  Component Value Date   TRIG 108.0 05/27/2018   Lab Results  Component Value Date   CHOLHDL 3 05/27/2018   Lab Results  Component Value Date   HGBA1C 6.0 05/27/2018      Assessment & Plan:   Problem List Items Addressed This Visit      Cardiovascular and Mediastinum   Essential hypertension - Primary   Relevant Orders   CBC   Comprehensive metabolic panel   Urinalysis, Routine w reflex microscopic   Uric acid   PVD (peripheral vascular disease) ([facility])   Relevant Orders   LDL cholesterol, direct   Lipid panel     Other  Hyperlipidemia   Relevant Orders   Comprehensive metabolic panel   LDL cholesterol, direct   Lipid panel   Tobacco use   Intermittent claudication ([facility])   Relevant Orders   LDL cholesterol, direct   Lipid panel   History of gout   Relevant Orders   Uric acid      No orders of the defined types were placed in this encounter.   Follow-up: Return in about 3 months (around 02/29/2020).  Recommended follow-up with vascular surgeon for the claudication pains in his l right leg and with his decreased blood flow.  He would like to hold off for now because he feels as though it is in bother him that much.  Continue medical therapy.  Advised tobacco cessation again and that it could truly make a difference.  Libby Maw, MD

## 2019-12-23 ENCOUNTER — Other Ambulatory Visit: Payer: Self-pay | Admitting: Family Medicine

## 2019-12-29 ENCOUNTER — Telehealth: Payer: Self-pay

## 2019-12-29 ENCOUNTER — Ambulatory Visit (INDEPENDENT_AMBULATORY_CARE_PROVIDER_SITE_OTHER): Payer: Medicare HMO

## 2019-12-29 ENCOUNTER — Other Ambulatory Visit: Payer: Self-pay

## 2019-12-29 VITALS — Ht 72.0 in | Wt 200.0 lb

## 2019-12-29 DIAGNOSIS — Z0000 Encounter for general adult medical examination without abnormal findings: Secondary | ICD-10-CM | POA: Diagnosis not present

## 2019-12-29 MED ORDER — LISINOPRIL 20 MG PO TABS: ORAL_TABLET | ORAL | 1 refills | Status: DC

## 2019-12-29 NOTE — Progress Notes (Signed)
Subjective:   Roger Stewart is a 74 y.o. male who presents for Medicare Annual/Subsequent preventive examination.  I connected with Ayden day by telephone and verified that I am speaking with the correct person using two identifiers. Location patient: home Location provider: work Persons participating in the virtual visit: patient, Marine scientist.    I discussed the limitations, risks, security and privacy concerns of performing an evaluation and management service by telephone and the availability of in person appointments. I also discussed with the patient that there may be a patient responsible charge related to this service. The patient expressed understanding and verbally consented to this telephonic visit.    Interactive audio and video telecommunications were attempted between this provider and patient, however failed, due to patient having technical difficulties OR patient did not have access to video capability.  We continued and completed visit with audio only.  Some vital signs may be absent or patient reported.   Time Spent with patient on telephone encounter: 25 minutes  Review of Systems     Cardiac Risk Factors include: advanced age (>6men, >100 women);hypertension;dyslipidemia;male gender     Objective:    Today's Vitals   12/29/19 1029 12/29/19 1030  Weight: 200 lb (90.7 kg)   Height: 6' (1.829 m)   PainSc:  3    Body mass index is 27.12 kg/m.  Advanced Directives 12/29/2019  Does Patient Have a Medical Advance Directive? Yes  Type of Paramedic of [place];Living will  Copy of [place] in Chart? No - copy requested    Current Medications (verified) Outpatient Encounter Medications as of 12/29/2019  Medication Sig  . atorvastatin (LIPITOR) 20 MG tablet TAKE 1 TABLET(20 MG) BY MOUTH DAILY  . cilostazol (PLETAL) 100 MG tablet Take 1 tablet (100 mg total) by mouth 2 (two) times daily.  . diclofenac (VOLTAREN) 25 MG EC  tablet Take 1 tablet (25 mg total) by mouth 2 (two) times daily as needed.  Marland Kitchen lisinopril (ZESTRIL) 20 MG tablet TAKE 1 TABLET(20 MG) BY MOUTH DAILY  . metoprolol tartrate (LOPRESSOR) 25 MG tablet Take 1 tablet (25 mg total) by mouth daily.  Marland Kitchen allopurinol (ZYLOPRIM) 100 MG tablet  (Patient not taking: Reported on 11/29/2019)  . diclofenac sodium (VOLTAREN) 1 % GEL Apply 2 g topically 4 (four) times daily. (Patient not taking: Reported on 06/28/2019)  . OVER THE COUNTER MEDICATION Take 25 mg by mouth every other day as needed (Pain). CBD Oil (Patient not taking: Reported on 11/29/2019)  . traMADol (ULTRAM) 50 MG tablet Take 1 tablet (50 mg total) by mouth every 8 (eight) hours as needed. (Patient not taking: Reported on 06/03/2019)  . [DISCONTINUED] predniSONE (DELTASONE) 10 MG tablet Take 1 tablet (10 mg total) by mouth daily with breakfast. (Patient not taking: Reported on 06/03/2019)   No facility-administered encounter medications on file as of 12/29/2019.    Allergies (verified) Norvasc [amlodipine besylate]   History: Past Medical History:  Diagnosis Date  . Cancer ([facility])    Basal Cell X 8; Dr Nevada Crane  . Colon polyps    Dr Amedeo Plenty  . Diverticulosis of colon   . Hyperlipidemia    elevated TG  . Hypertension   . Sciatica    Past Surgical History:  Procedure Laterality Date  . ABDOMINAL AORTIC ANEURYSM REPAIR  2005  . colonoscopy with polypectomy  2010;2016   Dr Amedeo Plenty  . ESI X3     LS spine  . LUMBAR SPINE SURGERY  04/2010   spur & bulging disc L 4-5, [place] , [place]  08/2010   cyst resected @ op site   Family History  Problem Relation Age of Onset  . Heart disease Mother        CHF ,CABG  . Prostate cancer Father   . Hypertension Father   . Transient ischemic attack Father        in 61s  . Hypertension Brother   . Acromegaly Brother   . Prostate cancer Brother   . Leukemia Brother   . Diabetes Neg Hx    Social History   Socioeconomic History  .  Marital status: Legally Separated    Spouse name: Not on file  . Number of children: 1  . Years of education: 65  . Highest education level: Not on file  Occupational History  . Not on file  Tobacco Use  . Smoking status: Current Every Day Smoker    Types: Cigars    Last attempt to quit: 06/10/2000    Years since quitting: 19.5  . Smokeless tobacco: Never Used  . Tobacco comment:  Smoked [phone], up to 3/4 ppd. 05/22/15 1 cigar a day. 10/26/19 2-4 cigars daily  Vaping Use  . Vaping Use: Never used  Substance and Sexual Activity  . Alcohol use: Yes    Alcohol/week: 16.0 standard drinks    Types: 2 Glasses of wine, 14 Cans of beer per week  . Drug use: No  . Sexual activity: Not on file  Other Topics Concern  . Not on file  Social History Narrative   Denies abuse and feels safe at home.    Social Determinants of Health   Financial Resource Strain: Low Risk   . Difficulty of Paying Living Expenses: Not hard at all  Food Insecurity: No Food Insecurity  . Worried About Charity fundraiser in the Last Year: Never true  . Ran Out of Food in the Last Year: Never true  Transportation Needs: No Transportation Needs  . Lack of Transportation (Medical): No  . Lack of Transportation (Non-Medical): No  Physical Activity: Insufficiently Active  . Days of Exercise per Week: 4 days  . Minutes of Exercise per Session: 30 min  Stress: No Stress Concern Present  . Feeling of Stress : Not at all  Social Connections: Moderately Isolated  . Frequency of Communication with Friends and Family: More than three times a week  . Frequency of Social Gatherings with Friends and Family: More than three times a week  . Attends Religious Services: Never  . Active Member of Clubs or Organizations: Yes  . Attends Archivist Meetings: More than 4 times per year  . Marital Status: Separated    Tobacco Counseling Ready to quit: Not Answered Counseling given: Not Answered Comment:  Smoked  [phone], up to 3/4 ppd. 05/22/15 1 cigar a day. 10/26/19 2-4 cigars daily   Clinical Intake:  Pre-visit preparation completed: Yes  Pain : 0-10 Pain Score: 3  Pain Type: Chronic pain Pain Location: Back Pain Orientation: Lower Pain Onset: More than a month ago Pain Frequency: Intermittent     Nutritional Status: BMI 25 -29 Overweight Nutritional Risks: None Diabetes: No  How often do you need to have someone help you when you read instructions, pamphlets, or other written materials from your doctor or pharmacy?: 1 - Never What is the last grade level you completed in school?: Bachelor's degree  Diabetic?No  Interpreter Needed?: No  Information entered by :: Caroleen Hamman LPN   Activities of Daily Living In your present state of health, do you have any difficulty performing the following activities: 12/29/2019  Hearing? N  Vision? N  Difficulty concentrating or making decisions? Y  Comment occasionally forgets names  Walking or climbing stairs? N  Dressing or bathing? N  Doing errands, shopping? N  Preparing Food and eating ? N  Using the Toilet? N  In the past six months, have you accidently leaked urine? N  Do you have problems with loss of bowel control? N  Managing your Medications? N  Managing your Finances? N  Housekeeping or managing your Housekeeping? N  Some recent data might be hidden    Patient Care Team: Libby Maw, MD as PCP - General (Family Medicine) Germaine Pomfret, [facility] as Pharmacist (Pharmacist)  Indicate any recent Medical Services you may have received from other than [facility] providers in the past year (date may be approximate).     Assessment:   This is a routine wellness examination for Roger Stewart.  Hearing/Vision screen  Hearing Screening   125Hz  250Hz  500Hz  1000Hz  2000Hz  3000Hz  4000Hz  6000Hz  8000Hz   Right ear:           Left ear:           Comments: Mild hearing loss  Vision Screening Comments: Wears glasses Last exam  12/28/2019-Sees Dr. Simonne Come  Dietary issues and exercise activities discussed: Current Exercise Habits: Home exercise routine, Type of exercise: Other - see comments (total gym machine), Time (Minutes): 30, Frequency (Times/Week): 4, Weekly Exercise (Minutes/Week): 120, Intensity: Mild  Goals    . Chronic Care Management     CARE PLAN ENTRY  Current Barriers:  . Chronic Disease Management support, education, and care coordination needs related to Hypertension and Hyperlipidemia   Hypertension . Pharmacist Clinical Goal(s): o Over the next 90 days, patient will work with PharmD and providers to maintain BP goal <140/90 . Current regimen:  o Lisinopril 20 mg  o Metoprolol Tartrate 25 mg  . Patient self care activities - Over the next 90 days, patient will: o Purchase new blood pressure monitor and resume checking blood pressure weekly, document, and provide at future appointments o Ensure daily salt intake < 2300 mg/day  Hyperlipidemia . Pharmacist Clinical Goal(s): o Over the next 90 days, patient will work with PharmD and providers to maintain LDL goal < 70 . Current regimen:  o Atorvastatin 20 mg  Medication management . Pharmacist Clinical Goal(s): o Over the next 180 days, patient will work with PharmD and providers to achieve optimal medication adherence . Current pharmacy: [facility] . Interventions o Comprehensive medication review performed. o Continue current medication management strategy . Patient self care activities - Over the next 180 days, patient will: o Take medications as prescribed o Report any questions or concerns to PharmD and/or provider(s)    . Patient Stated     Would like to start exercising more      Depression Screen PHQ 2/9 Scores 12/29/2019 05/31/2019 05/23/2016 05/22/2015 05/24/2013 05/22/2012  PHQ - 2 Score 0 0 0 0 0 0    Fall Risk Fall Risk  12/29/2019 11/29/2019 05/31/2019 05/05/2019 05/23/2016  Falls in the past year? 0 0 0 0 No  Comment - - -  Emmi Telephone Survey: data to providers prior to load -  Number falls in past yr: 0 - - - -  Injury with Fall? 0 - - - -  Follow up Falls  prevention discussed - - - -    Any stairs in or around the home? Yes  If so, are there any without handrails? No  Home free of loose throw rugs in walkways, pet beds, electrical cords, etc? Yes  Adequate lighting in your home to reduce risk of falls? Yes   ASSISTIVE DEVICES UTILIZED TO PREVENT FALLS:  Life alert? No  Use of a cane, walker or w/c? No  Grab bars in the bathroom? No  Shower chair or bench in shower? No  Elevated toilet seat or a handicapped toilet? No   TIMED UP AND GO:  Was the test performed? No . Virtual visit   Cognitive Function: No cognitive impairment noted. Patient plays Sodoku frequently.        Immunizations Immunization History  Administered Date(s) Administered  . Fluad Quad(high Dose 65+) 03/18/2019  . Influenza, High Dose Seasonal PF 05/26/2017  . Moderna SARS-COVID-2 Vaccination 08/09/2019, 09/06/2019  . Td 06/11/1999, 05/10/2010  . Zoster Recombinat (Shingrix) 02/09/2019    TDAP status: Up to date   Flu Vaccine status: Up to date   Pneumococcal vaccine status: Due. Patient to discuss with PCP at next office visit.  Covid-19 vaccine status: Completed vaccines  Qualifies for Shingles Vaccine? Yes   Zostavax completed No   Shingrix: Completed one dose.  Patient advised to discuss second dose with pharmacy.  Screening Tests Health Maintenance  Topic Date Due  . PNA vac Low Risk Adult (1 of 2 - PCV13) Never done  . INFLUENZA VACCINE  01/09/2020  . COLONOSCOPY  02/14/2020  . TETANUS/TDAP  05/10/2020  . COVID-19 Vaccine  Completed  . Hepatitis C Screening  Completed    Health Maintenance  Health Maintenance Due  Topic Date Due  . PNA vac Low Risk Adult (1 of 2 - PCV13) Never done    Colorectal cancer screening: Completed 02/14/2015. Repeat every 5 years  Lung Cancer Screening: (Low Dose  CT Chest recommended if Age 60-80 years, 30 pack-year currently smoking OR have quit w/in 15years.) does not qualify.    Additional Screening:  Hepatitis C Screening:  Completed 05/23/2016  Vision Screening: Recommended annual ophthalmology exams for early detection of glaucoma and other disorders of the eye. Is the patient up to date with their annual eye exam?  Yes  Who is the provider or what is the name of the office in which the patient attends annual eye exams? Dr. Simonne Come   Dental Screening: Recommended annual dental exams for proper oral hygiene  Community Resource Referral / Chronic Care Management: CRR required this visit?  No   CCM required this visit?  No      Plan:     I have personally reviewed and noted the following in the patient's chart:   . Medical and social history . Use of alcohol, tobacco or illicit drugs  . Current medications and supplements . Functional ability and status . Nutritional status . Physical activity . Advanced directives . List of other physicians . Hospitalizations, surgeries, and ER visits in previous 12 months . Vitals . Screenings to include cognitive, depression, and falls . Referrals and appointments  In addition, I have reviewed and discussed with patient certain preventive protocols, quality metrics, and best practice recommendations. A written personalized care plan for preventive services as well as general preventive health recommendations were provided to patient.  Due to this being a telephonic visit, the after visit summary with patients personalized plan was offered to patient via mail or my-chart.  Patient would like to access on my-chart.     Marta Antu, LPN   07/18/1386  Nurse Health Advisor  Nurse Notes: None

## 2019-12-29 NOTE — Patient Instructions (Signed)
Roger Stewart , Thank you for taking time to come for your Medicare Wellness Visit. I appreciate your ongoing commitment to your health goals. Please review the following plan we discussed and let me know if I can assist you in the future.   Screening recommendations/referrals: Colonoscopy: Completed 02/15/2015-Due 02/15/2020 Recommended yearly ophthalmology/optometry visit for glaucoma screening and checkup Recommended yearly dental visit for hygiene and checkup  Vaccinations: Influenza vaccine: Up to date-Due 02/2020 Pneumococcal vaccine: Due- Discuss with PCP at next office visit Tdap vaccine: Up to Date-Due 05/10/2020 Shingles vaccine: Check with pharmacy regarding second dose of Shingrix.  Covid-19: Completed vaccines  Advanced directives: Please bring a copy to your next office visit for your chart.  Conditions/risks identified: See problem list  Next appointment: Follow up in one year for your annual wellness visit.   Preventive Care 75 Years and Older, Male Preventive care refers to lifestyle choices and visits with your health care provider that can promote health and wellness. What does preventive care include?  A yearly physical exam. This is also called an annual well check.  Dental exams once or twice a year.  Routine eye exams. Ask your health care provider how often you should have your eyes checked.  Personal lifestyle choices, including:  Daily care of your teeth and gums.  Regular physical activity.  Eating a healthy diet.  Avoiding tobacco and drug use.  Limiting alcohol use.  Practicing safe sex.  Taking low doses of aspirin every day.  Taking vitamin and mineral supplements as recommended by your health care provider. What happens during an annual well check? The services and screenings done by your health care provider during your annual well check will depend on your age, overall health, lifestyle risk factors, and family history of disease. Counseling    Your health care provider may ask you questions about your:  Alcohol use.  Tobacco use.  Drug use.  Emotional well-being.  Home and relationship well-being.  Sexual activity.  Eating habits.  History of falls.  Memory and ability to understand (cognition).  Work and work Statistician. Screening  You may have the following tests or measurements:  Height, weight, and BMI.  Blood pressure.  Lipid and cholesterol levels. These may be checked every 5 years, or more frequently if you are over 75 years old.  Skin check.  Lung cancer screening. You may have this screening every year starting at age 75 if you have a 30-pack-year history of smoking and currently smoke or have quit within the past 15 years.  Fecal occult blood test (FOBT) of the stool. You may have this test every year starting at age 75.  Flexible sigmoidoscopy or colonoscopy. You may have a sigmoidoscopy every 5 years or a colonoscopy every 10 years starting at age 75.  Prostate cancer screening. Recommendations will vary depending on your family history and other risks.  Hepatitis C blood test.  Hepatitis B blood test.  Sexually transmitted disease (STD) testing.  Diabetes screening. This is done by checking your blood sugar (glucose) after you have not eaten for a while (fasting). You may have this done every 1-3 years.  Abdominal aortic aneurysm (AAA) screening. You may need this if you are a current or former smoker.  Osteoporosis. You may be screened starting at age 75 if you are at high risk. Talk with your health care provider about your test results, treatment options, and if necessary, the need for more tests. Vaccines  Your health care provider may recommend  certain vaccines, such as:  Influenza vaccine. This is recommended every year.  Tetanus, diphtheria, and acellular pertussis (Tdap, Td) vaccine. You may need a Td booster every 10 years.  Zoster vaccine. You may need this after age  75.  Pneumococcal 13-valent conjugate (PCV13) vaccine. One dose is recommended after age 28.  Pneumococcal polysaccharide (PPSV23) vaccine. One dose is recommended after age 13. Talk to your health care provider about which screenings and vaccines you need and how often you need them. This information is not intended to replace advice given to you by your health care provider. Make sure you discuss any questions you have with your health care provider. Document Released: 06/23/2015 Document Revised: 02/14/2016 Document Reviewed: 03/28/2015 Elsevier Interactive Patient Education  2017 [place] Prevention in the Home Falls can cause injuries. They can happen to people of all ages. There are many things you can do to make your home safe and to help prevent falls. What can I do on the outside of my home?  Regularly fix the edges of walkways and driveways and fix any cracks.  Remove anything that might make you trip as you walk through a door, such as a raised step or threshold.  Trim any bushes or trees on the path to your home.  Use bright outdoor lighting.  Clear any walking paths of anything that might make someone trip, such as rocks or tools.  Regularly check to see if handrails are loose or broken. Make sure that both sides of any steps have handrails.  Any raised decks and porches should have guardrails on the edges.  Have any leaves, snow, or ice cleared regularly.  Use sand or salt on walking paths during winter.  Clean up any spills in your garage right away. This includes oil or grease spills. What can I do in the bathroom?  Use night lights.  Install grab bars by the toilet and in the tub and shower. Do not use towel bars as grab bars.  Use non-skid mats or decals in the tub or shower.  If you need to sit down in the shower, use a plastic, non-slip stool.  Keep the floor dry. Clean up any water that spills on the floor as soon as it happens.  Remove  soap buildup in the tub or shower regularly.  Attach bath mats securely with double-sided non-slip rug tape.  Do not have throw rugs and other things on the floor that can make you trip. What can I do in the bedroom?  Use night lights.  Make sure that you have a light by your bed that is easy to reach.  Do not use any sheets or blankets that are too big for your bed. They should not hang down onto the floor.  Have a firm chair that has side arms. You can use this for support while you get dressed.  Do not have throw rugs and other things on the floor that can make you trip. What can I do in the kitchen?  Clean up any spills right away.  Avoid walking on wet floors.  Keep items that you use a lot in easy-to-reach places.  If you need to reach something above you, use a strong step stool that has a grab bar.  Keep electrical cords out of the way.  Do not use floor polish or wax that makes floors slippery. If you must use wax, use non-skid floor wax.  Do not have throw rugs and other  things on the floor that can make you trip. What can I do with my stairs?  Do not leave any items on the stairs.  Make sure that there are handrails on both sides of the stairs and use them. Fix handrails that are broken or loose. Make sure that handrails are as long as the stairways.  Check any carpeting to make sure that it is firmly attached to the stairs. Fix any carpet that is loose or worn.  Avoid having throw rugs at the top or bottom of the stairs. If you do have throw rugs, attach them to the floor with carpet tape.  Make sure that you have a light switch at the top of the stairs and the bottom of the stairs. If you do not have them, ask someone to add them for you. What else can I do to help prevent falls?  Wear shoes that:  Do not have high heels.  Have rubber bottoms.  Are comfortable and fit you well.  Are closed at the toe. Do not wear sandals.  If you use a  stepladder:  Make sure that it is fully opened. Do not climb a closed stepladder.  Make sure that both sides of the stepladder are locked into place.  Ask someone to hold it for you, if possible.  Clearly mark and make sure that you can see:  Any grab bars or handrails.  First and last steps.  Where the edge of each step is.  Use tools that help you move around (mobility aids) if they are needed. These include:  Canes.  Walkers.  Scooters.  Crutches.  Turn on the lights when you go into a dark area. Replace any light bulbs as soon as they burn out.  Set up your furniture so you have a clear path. Avoid moving your furniture around.  If any of your floors are uneven, fix them.  If there are any pets around you, be aware of where they are.  Review your medicines with your doctor. Some medicines can make you feel dizzy. This can increase your chance of falling. Ask your doctor what other things that you can do to help prevent falls. This information is not intended to replace advice given to you by your health care provider. Make sure you discuss any questions you have with your health care provider. Document Released: 03/23/2009 Document Revised: 11/02/2015 Document Reviewed: 07/01/2014 Elsevier Interactive Patient Education  2017 [employer].

## 2019-12-29 NOTE — Telephone Encounter (Signed)
Medication was sent to preferred pharmacy in [place] but [facility] [address]. [facility].

## 2020-01-07 ENCOUNTER — Other Ambulatory Visit: Payer: Self-pay | Admitting: Family Medicine

## 2020-01-07 DIAGNOSIS — I739 Peripheral vascular disease, unspecified: Secondary | ICD-10-CM

## 2020-01-20 ENCOUNTER — Telehealth: Payer: Self-pay

## 2020-01-20 NOTE — Progress Notes (Addendum)
Left Voice message to confirm the patient telephone appointment on 01/24/2020 for CCM at 10:00 AM with Junius Argyle the Clinical pharmacist.   [place] Pharmacist Assistant [phone]

## 2020-01-24 ENCOUNTER — Ambulatory Visit: Payer: Medicare HMO

## 2020-01-24 DIAGNOSIS — I10 Essential (primary) hypertension: Secondary | ICD-10-CM

## 2020-01-24 DIAGNOSIS — E782 Mixed hyperlipidemia: Secondary | ICD-10-CM

## 2020-01-24 NOTE — Chronic Care Management (AMB) (Signed)
Chronic Care Management Pharmacy  Name: Roger Stewart  MRN: [id number] DOB: Dec 07, 1945  Chief Complaint/ HPI  Roger Stewart,  74 y.o. , male presents for their Initial CCM visit with the clinical pharmacist via telephone due to COVID-19 Pandemic.  PCP : Libby Maw, MD  Their chronic conditions include: Hypertension, PVD, hyperlipidemia, gout, tobacco abuse, chronic pain  Office Visits: 11/29/19: Patient presented to Dr. Ethelene Hal for follow-up. Advised smoking cessation and vascular follow-up.  05/31/19: Patient presented to Dr. Ethelene Hal for follow-up. Unable to tolerate amlodipine. No medication changes made.   Consult Visit: 06/28/19: Televisit with Dr. Geraldo Pitter (Cardiology). Patient reports smoking 1 cigar daily. No medication changes noted.  06/08/19: Patient presented to Dr. Carlis Abbott for evaluation of PVD. Symptoms consistent with claudication. No medication changes noted.  Medications: Outpatient Encounter Medications as of 01/24/2020  Medication Sig Note  . allopurinol (ZYLOPRIM) 100 MG tablet  (Patient not taking: Reported on 11/29/2019)   . atorvastatin (LIPITOR) 20 MG tablet TAKE 1 TABLET(20 MG) BY MOUTH DAILY   . cilostazol (PLETAL) 100 MG tablet TAKE 1 TABLET BY MOUTH TWICE A DAY   . diclofenac (VOLTAREN) 25 MG EC tablet Take 1 tablet (25 mg total) by mouth 2 (two) times daily as needed. 11/29/2019: PRN  . diclofenac sodium (VOLTAREN) 1 % GEL Apply 2 g topically 4 (four) times daily. (Patient not taking: Reported on 06/28/2019)   . lisinopril (ZESTRIL) 20 MG tablet TAKE 1 TABLET(20 MG) BY MOUTH DAILY   . metoprolol tartrate (LOPRESSOR) 25 MG tablet Take 1 tablet (25 mg total) by mouth daily.   Marland Kitchen OVER THE COUNTER MEDICATION Take 25 mg by mouth every other day as needed (Pain). CBD Oil (Patient not taking: Reported on 11/29/2019)   . traMADol (ULTRAM) 50 MG tablet Take 1 tablet (50 mg total) by mouth every 8 (eight) hours as needed. (Patient not taking: Reported on  06/03/2019)    No facility-administered encounter medications on file as of 01/24/2020.   Current Diagnosis/Assessment:  Goals Addressed   None    Hypertension   BP today is:  <130/80  Office blood pressures are  BP Readings from Last 3 Encounters:  11/29/19 112/74  06/28/19 130/86  06/08/19 133/89   CMP Latest Ref Rng & Units 11/29/2019 05/31/2019 03/18/2019  Glucose 70 - 99 mg/dL 115(H) 117(H) 99  BUN 6 - 23 mg/dL 18 17 12   Creatinine 0.40 - 1.50 mg/dL 0.81 0.82 0.71  Sodium 135 - 145 mEq/L 141 138 138  Potassium 3.5 - 5.1 mEq/L 4.0 4.5 4.6  Chloride 96 - 112 mEq/L 105 103 103  CO2 19 - 32 mEq/L 29 27 29   Calcium 8.4 - 10.5 mg/dL 8.6 9.4 9.4  Total Protein 6.0 - 8.3 g/dL 5.9(L) 6.4 6.9  Total Bilirubin 0.2 - 1.2 mg/dL 0.7 0.6 0.6  Alkaline Phos 39 - 117 U/L 68 81 79  AST 0 - 37 U/L 16 17 20   ALT 0 - 53 U/L 12 15 19    Patient has failed these meds in the past: Amlodipine (feel bad)  Patient is currently controlled on the following medications:   Lisinopril 20 mg   Metoprolol Tartrate 25 mg daily  Patient checks BP at home weekly  Patient home BP readings are ranging: 108/77,   We discussed diet and exercise extensively Patient reports frequent instances of dizziness and lightheadedness associated with standing up too fast. He states he experiences episodes of orthostatic hypotension every 2-3 days. Last time it occurred  and he checked his BP it was 110/68.  He does salt food sometimes. Stretching exercises daily, golfs 2-3x weekly. Not walking as much due to claudication, but was previously walking ~1 mile daily.  Plan  Recommend stopping metoprolol tartrate to minimize risk of dizziness and hypotension.  Hyperlipidemia / Peripheral Vascular Disease   Intermittent claudication in the right calf Aortic Aneurysm (repair 2005)  Lipid Panel     Component Value Date/Time   CHOL 130 11/29/2019 0943   CHOL 251 (H) 05/18/2014 1201   TRIG 58.0 11/29/2019 0943   TRIG  184 (H) 05/18/2014 1201   TRIG 175 (H) 05/14/2006 1031   HDL 63.00 11/29/2019 0943   HDL 63 05/18/2014 1201   CHOLHDL 2 11/29/2019 0943   VLDL 11.6 11/29/2019 0943   LDLCALC 56 11/29/2019 0943   LDLCALC 151 (H) 05/18/2014 1201   LDLDIRECT 52.0 11/29/2019 0943     The 10-year ASCVD risk score Mikey Bussing DC Jr., et al., 2013) is: 19.1%   Values used to calculate the score:     Age: 32 years     Sex: Male     Is Non-Hispanic African American: No     Diabetic: No     Tobacco smoker: Yes     Systolic Blood Pressure: 782 mmHg     Is BP treated: Yes     HDL Cholesterol: 63 mg/dL     Total Cholesterol: 130 mg/dL   Patient has failed these meds in past: n/a Patient is currently controlled on the following medications:   Atorvastatin 20 mg daily   Cilostazole 100 mg BID    We discussed:  Patient states cilostazole has helped his claudication somewhat, but it is still fairly limiting and cramps regularly. Tolerating atorvastatin well.   Plan  Continue current medications   Tobacco Abuse   Tobacco Status:  Social History   Tobacco Use  Smoking Status Current Every Day Smoker  . Types: Cigars  . Last attempt to quit: 06/10/2000  . Years since quitting: 19.6  Smokeless Tobacco Never Used  Tobacco Comment    Smoked [phone], up to 3/4 ppd. 05/22/15 1 cigar a day. 10/26/19 2-4 cigars daily    Patient smokes After 30 minutes of waking Patient triggers include: watching television, finishing a meal and after golfing On a scale of 1-10, reports MOTIVATION to quit is 4 On a scale of 1-10, reports CONFIDENCE in quitting is 2  Patient has failed these meds in past: n/a Patient is currently uncontrolled on the following medications: none  We discussed: Patient did not think he was ready to quit smoking within the next 30 days. Currently smoking 2-4 cigars daily depending on how busy he gets. Counseled patient on the importance of smoking cessation to decrease risk of cardiovascular events  and due to patient's history of PVD.   Plan  Continue current medications  Gout   Uric Acid, Serum  Date Value Ref Range Status  11/29/2019 5.9 4.0 - 7.8 mg/dL Final     Patient has failed these meds in past: Allopurinol (no longer needed), Gouch (cherry tart + quercetin)  Patient is currently controlled on the following medications: none   We discussed:  Two gouts in past two years. Patient treated flares with allopurinol/Gouch supplement, but no longer taking anything to prevent further gout flares. Counseled on importance of low-purine diet.  Plan  Continue control with diet and exercise  Chronic Pain   Back pain  Patient has failed these meds in  past: Tramadol,  Patient is currently managed on the following medications:   Diclofenac 25 mg BID PRN (rarely uses)  Diclofenac 1% gel QID (rarely uses)  CBD Oil  We discussed:  Well controlled most days.   Plan  Continue current medications  Misc/OTC   Co Q10 100 mg  Vitamin D3 2000 iu Testosterone daily   Multivitamin daily  Glutathione daily  Zyrtec 10 mg daily   Plan  Continue current medications   Vaccines   Reviewed and discussed patient's vaccination history.    Immunization History  Administered Date(s) Administered  . Fluad Quad(high Dose 65+) 03/18/2019  . Influenza, High Dose Seasonal PF 05/26/2017  . Moderna SARS-COVID-2 Vaccination 08/09/2019, 09/06/2019  . Td 06/11/1999, 05/10/2010  . Zoster Recombinat (Shingrix) 02/09/2019   Medication Management   [facility].  Uses pill box? No - Keeps organized in shaving bag  Plan  Continue current medication management strategy  Follow up: 6 month phone visit  [place] [facility]  [phone]

## 2020-01-25 NOTE — Patient Instructions (Signed)
Visit Information It was great speaking with you today!  Please let me know if you have any questions about our visit. Goals Addressed            This Visit's Progress   . Chronic Care Management   Not on track    CARE PLAN ENTRY  Current Barriers:  . Chronic Disease Management support, education, and care coordination needs related to Hypertension and Hyperlipidemia   Hypertension . Pharmacist Clinical Goal(s): o Over the next 90 days, patient will work with PharmD and providers to maintain BP goal <140/90 . Current regimen:  o Lisinopril 20 mg  o Metoprolol Tartrate 25 mg  . Patient self care activities - Over the next 90 days, patient will: o Purchase new blood pressure monitor and resume checking blood pressure weekly, document, and provide at future appointments o Ensure daily salt intake < 2300 mg/day  Hyperlipidemia . Pharmacist Clinical Goal(s): o Over the next 90 days, patient will work with PharmD and providers to maintain LDL goal < 70 . Current regimen:  o Atorvastatin 20 mg  Medication management . Pharmacist Clinical Goal(s): o Over the next 180 days, patient will work with PharmD and providers to achieve optimal medication adherence . [facility] . Interventions o Comprehensive medication review performed. o Continue current medication management strategy . Patient self care activities - Over the next 180 days, patient will: o Take medications as prescribed o Report any questions or concerns to PharmD and/or provider(s)       The patient verbalized understanding of instructions provided today and agreed to receive a mailed copy of patient instruction and/or educational materials.  Telephone follow up appointment with pharmacy team member scheduled for: 07/22/19 at 2:00 PM   [facility]  [phone]

## 2020-01-26 ENCOUNTER — Telehealth: Payer: Medicare HMO

## 2020-02-29 ENCOUNTER — Ambulatory Visit: Payer: Medicare HMO | Admitting: Family Medicine

## 2020-03-10 ENCOUNTER — Telehealth: Payer: Self-pay

## 2020-03-10 DIAGNOSIS — I10 Essential (primary) hypertension: Secondary | ICD-10-CM

## 2020-03-10 NOTE — Progress Notes (Signed)
Chronic Care Management Pharmacy Assistant   Name: Roger Stewart  MRN: [id number] DOB: 1945-07-27  Reason for [place] Call.   PCP : Libby Maw, MD  Allergies:   Allergies  Allergen Reactions  . Norvasc [Amlodipine Besylate] Other (See Comments)    Body aches    Medications: Outpatient Encounter Medications as of 03/10/2020  Medication Sig Note  . allopurinol (ZYLOPRIM) 100 MG tablet  (Patient not taking: Reported on 11/29/2019)   . atorvastatin (LIPITOR) 20 MG tablet TAKE 1 TABLET(20 MG) BY MOUTH DAILY   . cilostazol (PLETAL) 100 MG tablet TAKE 1 TABLET BY MOUTH TWICE A DAY   . diclofenac (VOLTAREN) 25 MG EC tablet Take 1 tablet (25 mg total) by mouth 2 (two) times daily as needed. 11/29/2019: PRN  . diclofenac sodium (VOLTAREN) 1 % GEL Apply 2 g topically 4 (four) times daily. (Patient not taking: Reported on 06/28/2019)   . lisinopril (ZESTRIL) 20 MG tablet TAKE 1 TABLET(20 MG) BY MOUTH DAILY   . metoprolol tartrate (LOPRESSOR) 25 MG tablet Take 1 tablet (25 mg total) by mouth daily.   Marland Kitchen OVER THE COUNTER MEDICATION Take 25 mg by mouth every other day as needed (Pain). CBD Oil (Patient not taking: Reported on 11/29/2019)   . traMADol (ULTRAM) 50 MG tablet Take 1 tablet (50 mg total) by mouth every 8 (eight) hours as needed. (Patient not taking: Reported on 06/03/2019)    No facility-administered encounter medications on file as of 03/10/2020.    Current Diagnosis: Patient Active Problem List   Diagnosis Date Noted  . History of gout 11/29/2019  . Ascending aorta dilatation (HCC) 06/28/2019  . Atherosclerotic vascular disease 06/03/2019  . Chest discomfort 06/03/2019  . H/O aortic aneurysm repair 06/03/2019  . PVD (peripheral vascular disease) ([place]) 03/18/2019  . Intermittent claudication ([place]) 01/01/2019  . Elevated LDL cholesterol level 05/29/2018  . Benign localized prostatic hyperplasia with lower urinary tract symptoms (LUTS)  05/26/2017  . Elevated blood sugar 05/26/2017  . Excessive cerumen in both ear canals 05/26/2017  . Tobacco use 05/26/2017  . Encounter for screening for lung cancer 05/26/2017  . Sciatica of right side 05/12/2017  . Acute bronchitis with COPD ([place]) 05/12/2017  . Screening for hyperlipidemia 05/23/2016  . Other abnormal glucose 05/22/2012  . Nonspecific abnormal electrocardiogram (ECG) (EKG) 05/22/2012  . Family history of prostate cancer 05/22/2012  . INGUINAL HERNIA, LEFT, SMALL 05/10/2010  . SPINAL STENOSIS, LUMBAR 04/26/2009  . Lumbar radiculopathy 04/26/2009  . DIVERTICULOSIS, COLON 12/20/2008  . History of colonic polyps 12/20/2008  . Hyperlipidemia 08/06/2007  . Essential hypertension 08/06/2007  . SKIN CANCER, HX OF 08/06/2007     Follow-Up:  Pharmacist Review   Reviewed chart prior to disease state call. Spoke with patient regarding BP  Recent Office Vitals: BP Readings from Last 3 Encounters:  11/29/19 112/74  06/28/19 130/86  06/08/19 133/89   Pulse Readings from Last 3 Encounters:  11/29/19 87  06/28/19 92  06/08/19 79    Wt Readings from Last 3 Encounters:  12/29/19 200 lb (90.7 kg)  11/29/19 200 lb 9.6 oz (91 kg)  06/28/19 202 lb (91.6 kg)     Kidney Function Lab Results  Component Value Date/Time   CREATININE 0.81 11/29/2019 09:43 AM   CREATININE 0.82 05/31/2019 11:10 AM   GFR 92.96 11/29/2019 09:43 AM   GFRNONAA 90.35 12/20/2008 12:00 AM   GFRAA 110 12/24/2007 12:00 AM    BMP Latest Ref Rng &  Units 11/29/2019 05/31/2019 03/18/2019  Glucose 70 - 99 mg/dL 115(H) 117(H) 99  BUN 6 - 23 mg/dL 18 17 12   Creatinine 0.40 - 1.50 mg/dL 0.81 0.82 0.71  Sodium 135 - 145 mEq/L 141 138 138  Potassium 3.5 - 5.1 mEq/L 4.0 4.5 4.6  Chloride 96 - 112 mEq/L 105 103 103  CO2 19 - 32 mEq/L 29 27 29   Calcium 8.4 - 10.5 mg/dL 8.6 9.4 9.4    . Current antihypertensive regimen:  o Lisinopril 20 MG Daily o Metoprolol 25 MG Daily . How often are you checking your  Blood Pressure? weekly . Current home BP readings:  o Patient states his blood pressure been ranging around 127/80. Marland Kitchen What recent interventions/DTPs have been made by any provider to improve Blood Pressure control since last CPP Visit: None ID . Any recent hospitalizations or ED visits since last visit with CPP? No . What diet changes have been made to improve Blood Pressure Control?  o Patient states he mostly eats out. . What exercise is being done to improve your Blood Pressure Control?  o Patient states he does not exercise at this time.  Adherence Review: Is the patient currently on ACE/ARB medication? Yes Does the patient have >5 day gap between last estimated fill dates? Yes   [place] Pharmacist Assistant [phone]

## 2020-03-14 ENCOUNTER — Ambulatory Visit: Payer: Medicare HMO | Admitting: Family Medicine

## 2020-03-22 ENCOUNTER — Encounter: Payer: Self-pay | Admitting: Family Medicine

## 2020-03-22 ENCOUNTER — Ambulatory Visit (INDEPENDENT_AMBULATORY_CARE_PROVIDER_SITE_OTHER): Payer: Medicare HMO | Admitting: Family Medicine

## 2020-03-22 ENCOUNTER — Other Ambulatory Visit: Payer: Self-pay

## 2020-03-22 VITALS — BP 100/64 | HR 84 | Temp 97.9°F | Ht 72.0 in | Wt 209.6 lb

## 2020-03-22 DIAGNOSIS — I10 Essential (primary) hypertension: Secondary | ICD-10-CM

## 2020-03-22 DIAGNOSIS — E782 Mixed hyperlipidemia: Secondary | ICD-10-CM

## 2020-03-22 DIAGNOSIS — Z23 Encounter for immunization: Secondary | ICD-10-CM

## 2020-03-22 DIAGNOSIS — E7800 Pure hypercholesterolemia, unspecified: Secondary | ICD-10-CM | POA: Diagnosis not present

## 2020-03-22 DIAGNOSIS — D649 Anemia, unspecified: Secondary | ICD-10-CM | POA: Insufficient documentation

## 2020-03-22 DIAGNOSIS — I739 Peripheral vascular disease, unspecified: Secondary | ICD-10-CM | POA: Diagnosis not present

## 2020-03-22 DIAGNOSIS — Z720 Tobacco use: Secondary | ICD-10-CM | POA: Diagnosis not present

## 2020-03-22 DIAGNOSIS — H6123 Impacted cerumen, bilateral: Secondary | ICD-10-CM

## 2020-03-22 LAB — CBC
HCT: 37.7 % — ABNORMAL LOW (ref 39.0–52.0)
Hemoglobin: 12.9 g/dL — ABNORMAL LOW (ref 13.0–17.0)
MCHC: 34.3 g/dL (ref 30.0–36.0)
MCV: 96.9 fl (ref 78.0–100.0)
Platelets: 200 10*3/uL (ref 150.0–400.0)
RBC: 3.89 Mil/uL — ABNORMAL LOW (ref 4.22–5.81)
RDW: 14.6 % (ref 11.5–15.5)
WBC: 7.1 10*3/uL (ref 4.0–10.5)

## 2020-03-22 LAB — URINALYSIS, ROUTINE W REFLEX MICROSCOPIC
Bilirubin Urine: NEGATIVE
Hgb urine dipstick: NEGATIVE
Leukocytes,Ua: NEGATIVE
Nitrite: NEGATIVE
RBC / HPF: NONE SEEN (ref 0–?)
Specific Gravity, Urine: 1.025 (ref 1.000–1.030)
Total Protein, Urine: NEGATIVE
Urine Glucose: NEGATIVE
Urobilinogen, UA: 0.2 (ref 0.0–1.0)
pH: 6 (ref 5.0–8.0)

## 2020-03-22 LAB — B12 AND FOLATE PANEL
Folate: >24.8 ng/mL (ref >5.9)
Vitamin B-12: 567 pg/mL (ref 211–911)

## 2020-03-22 MED ORDER — DEBROX 6.5 % OT SOLN: OTIC | 0 refills | Status: DC

## 2020-03-22 NOTE — Progress Notes (Signed)
Established Patient Office Visit  Subjective:  Patient ID: Roger Stewart, male    DOB: [birth year]  Age: 74 y.o. MRN: [id number]  CC:  Chief Complaint  Patient presents with  . Follow-up    3 month follow up     HPI Roger Stewart presents for follow-up of hypertension, elevated cholesterol, PVD, anemia and ceruminosis. Has never taken a flu shot but agrees to take one today. Blood pressure has been well controlled with his current regimen of lisinopril and metoprolol. Unfortunately he does not tolerate amlodipine. Blood pressure is low today. Admits that he does not always hydrate well. Continues atorvastatin. Recent colonoscopy positive for precancerous polyps. He was advised to return in a year. He will be traveling to [place] in the next week or so.  Past Medical History:  Diagnosis Date  . Cancer ([place])    Basal Cell X 8; Dr Nevada Crane  . Colon polyps    Dr Amedeo Plenty  . Diverticulosis of colon   . Hyperlipidemia    elevated TG  . Hypertension   . Sciatica     Past Surgical History:  Procedure Laterality Date  . ABDOMINAL AORTIC ANEURYSM REPAIR  2005  . colonoscopy with polypectomy  2010;2016   Dr Amedeo Plenty  . ESI X3     LS spine  . LUMBAR SPINE SURGERY  04/2010   spur & bulging disc L 4-5, [place] , [place]  08/2010   cyst resected @ op site    Family History  Problem Relation Age of Onset  . Heart disease Mother        CHF ,CABG  . Prostate cancer Father   . Hypertension Father   . Transient ischemic attack Father        in 66s  . Hypertension Brother   . Acromegaly Brother   . Prostate cancer Brother   . Leukemia Brother   . Diabetes Neg Hx     Social History   Socioeconomic History  . Marital status: Legally Separated    Spouse name: Not on file  . Number of children: 1  . Years of education: 55  . Highest education level: Not on file  Occupational History  . Not on file  Tobacco Use  . Smoking status: Current Every Day Smoker    Types:  Cigars    Last attempt to quit: 06/10/2000    Years since quitting: 19.7  . Smokeless tobacco: Never Used  . Tobacco comment:  Smoked [phone], up to 3/4 ppd. 05/22/15 1 cigar a day. 10/26/19 2-4 cigars daily  Vaping Use  . Vaping Use: Never used  Substance and Sexual Activity  . Alcohol use: Yes    Alcohol/week: 16.0 standard drinks    Types: 2 Glasses of wine, 14 Cans of beer per week  . Drug use: No  . Sexual activity: Not on file  Other Topics Concern  . Not on file  Social History Narrative   Denies abuse and feels safe at home.    Social Determinants of Health   Financial Resource Strain: Low Risk   . Difficulty of Paying Living Expenses: Not hard at all  Food Insecurity: No Food Insecurity  . Worried About Charity fundraiser in the Last Year: Never true  . Ran Out of Food in the Last Year: Never true  Transportation Needs: No Transportation Needs  . Lack of Transportation (Medical): No  . Lack of Transportation (Non-Medical): No  Physical Activity:  Insufficiently Active  . Days of Exercise per Week: 4 days  . Minutes of Exercise per Session: 30 min  Stress: No Stress Concern Present  . Feeling of Stress : Not at all  Social Connections: Moderately Isolated  . Frequency of Communication with Friends and Family: More than three times a week  . Frequency of Social Gatherings with Friends and Family: More than three times a week  . Attends Religious Services: Never  . Active Member of Clubs or Organizations: Yes  . Attends Archivist Meetings: More than 4 times per year  . Marital Status: Separated  Intimate Partner Violence: Not At Risk  . Fear of Current or Ex-Partner: No  . Emotionally Abused: No  . Physically Abused: No  . Sexually Abused: No    Outpatient Medications Prior to Visit  Medication Sig Dispense Refill  . atorvastatin (LIPITOR) 20 MG tablet TAKE 1 TABLET(20 MG) BY MOUTH DAILY 90 tablet 3  . cilostazol (PLETAL) 100 MG tablet TAKE 1 TABLET  BY MOUTH TWICE A DAY 180 tablet 0  . diclofenac (VOLTAREN) 25 MG EC tablet Take 1 tablet (25 mg total) by mouth 2 (two) times daily as needed. 180 tablet 0  . lisinopril (ZESTRIL) 20 MG tablet TAKE 1 TABLET(20 MG) BY MOUTH DAILY 90 tablet 1  . metoprolol tartrate (LOPRESSOR) 25 MG tablet Take 1 tablet (25 mg total) by mouth daily. 180 tablet 1  . allopurinol (ZYLOPRIM) 100 MG tablet  (Patient not taking: Reported on 11/29/2019)    . diclofenac sodium (VOLTAREN) 1 % GEL Apply 2 g topically 4 (four) times daily. (Patient not taking: Reported on 06/28/2019) 100 g 1  . OVER THE COUNTER MEDICATION Take 25 mg by mouth every other day as needed (Pain). CBD Oil (Patient not taking: Reported on 11/29/2019)    . traMADol (ULTRAM) 50 MG tablet Take 1 tablet (50 mg total) by mouth every 8 (eight) hours as needed. (Patient not taking: Reported on 06/03/2019) 30 tablet 0   No facility-administered medications prior to visit.    Allergies  Allergen Reactions  . Norvasc [Amlodipine Besylate] Other (See Comments)    Body aches    ROS Review of Systems  Constitutional: Negative.   HENT: Positive for hearing loss.   Eyes: Negative for photophobia.  Respiratory: Negative.   Cardiovascular: Negative.   Gastrointestinal: Negative.   Genitourinary: Negative.   Musculoskeletal: Negative for gait problem and joint swelling.  Allergic/Immunologic: Negative for immunocompromised state.  Neurological: Negative for light-headedness and numbness.  Hematological: Does not bruise/bleed easily.  Psychiatric/Behavioral: Negative.       Objective:    Physical Exam Nursing note reviewed.  Constitutional:      General: He is not in acute distress.    Appearance: Normal appearance. He is not ill-appearing, toxic-appearing or diaphoretic.  HENT:     Head: Normocephalic and atraumatic.     Right Ear: There is impacted cerumen.     Left Ear: There is impacted cerumen.  Eyes:     General:        Right eye: No  discharge.        Left eye: No discharge.     Conjunctiva/sclera: Conjunctivae normal.  Cardiovascular:     Rate and Rhythm: Normal rate and regular rhythm.  Pulmonary:     Effort: Pulmonary effort is normal.     Breath sounds: Normal breath sounds.  Abdominal:     General: Bowel sounds are normal.  Musculoskeletal:  Cervical back: No rigidity or tenderness.  Lymphadenopathy:     Cervical: No cervical adenopathy.  Skin:    General: Skin is warm and dry.  Neurological:     Mental Status: He is alert and oriented to person, place, and time.  Psychiatric:        Mood and Affect: Mood normal.        Behavior: Behavior normal.     BP 100/64   Pulse 84   Temp 97.9 F (36.6 C) (Tympanic)   Ht 6' (1.829 m)   Wt 209 lb 9.6 oz (95.1 kg)   SpO2 94%   BMI 28.43 kg/m  Wt Readings from Last 3 Encounters:  03/22/20 209 lb 9.6 oz (95.1 kg)  12/29/19 200 lb (90.7 kg)  11/29/19 200 lb 9.6 oz (91 kg)     Health Maintenance Due  Topic Date Due  . PNA vac Low Risk Adult (1 of 2 - PCV13) Never done  . COLONOSCOPY  02/14/2020    There are no preventive care reminders to display for this patient.  Lab Results  Component Value Date   TSH 1.86 05/27/2018   Lab Results  Component Value Date   WBC 7.2 11/29/2019   HGB 12.5 (L) 11/29/2019   HCT 36.9 (L) 11/29/2019   MCV 95.0 11/29/2019   PLT 200.0 11/29/2019   Lab Results  Component Value Date   NA 141 11/29/2019   K 4.0 11/29/2019   CO2 29 11/29/2019   GLUCOSE 115 (H) 11/29/2019   BUN 18 11/29/2019   CREATININE 0.81 11/29/2019   BILITOT 0.7 11/29/2019   ALKPHOS 68 11/29/2019   AST 16 11/29/2019   ALT 12 11/29/2019   PROT 5.9 (L) 11/29/2019   ALBUMIN 3.5 11/29/2019   CALCIUM 8.6 11/29/2019   GFR 92.96 11/29/2019   Lab Results  Component Value Date   CHOL 130 11/29/2019   Lab Results  Component Value Date   HDL 63.00 11/29/2019   Lab Results  Component Value Date   LDLCALC 56 11/29/2019   Lab Results    Component Value Date   TRIG 58.0 11/29/2019   Lab Results  Component Value Date   CHOLHDL 2 11/29/2019   Lab Results  Component Value Date   HGBA1C 6.0 05/27/2018      Assessment & Plan:   Problem List Items Addressed This Visit      Cardiovascular and Mediastinum   Essential hypertension   Relevant Orders   Urinalysis, Routine w reflex microscopic   PVD (peripheral vascular disease) ([place]) - Primary     Nervous and Auditory   Excessive cerumen in both ear canals   Relevant Medications   carbamide peroxide (DEBROX) 6.5 % OTIC solution     Other   Mixed hyperlipidemia   Tobacco use   Elevated LDL cholesterol level   Anemia   Relevant Orders   CBC   Iron, TIBC and Ferritin Panel   B12 and Folate Panel    Other Visit Diagnoses    Need for influenza vaccination       Relevant Orders   Flu Vaccine QUAD High Dose(Fluad) (Completed)      Meds ordered this encounter  Medications  . carbamide peroxide (DEBROX) 6.5 % OTIC solution    Sig: Place 5 drops in each ear and plug with cotton for 1 hour and then remove.    Dispense:  15 mL    Refill:  0    Follow-up: Return in about 3 months (  around 06/22/2020), or Please stop smoking!.   Patient is planning on having a physician in [place] as well. Advised him to follow-up on his blood pressure. Agrees to have a flu vaccine at my urging. Also asked him to have the name and number of a pharmacy in case he needs refills. Asked him to use Debrox eardrops followed by a piece of cotton twice daily to soften the wax. His wax today is hard and will be difficult to irrigate. Libby Maw, MD

## 2020-03-23 LAB — IRON,TIBC AND FERRITIN PANEL
%SAT: 42 % (calc) (ref 20–48)
Ferritin: 110 ng/mL (ref 24–380)
Iron: 121 ug/dL (ref 50–180)
TIBC: 286 mcg/dL (calc) (ref 250–425)

## 2020-04-08 ENCOUNTER — Other Ambulatory Visit: Payer: Self-pay | Admitting: Family Medicine

## 2020-04-08 DIAGNOSIS — I739 Peripheral vascular disease, unspecified: Secondary | ICD-10-CM

## 2020-04-10 NOTE — Telephone Encounter (Signed)
Last OV 12/06/19 Last fill 02/17/20  #30/2

## 2020-05-15 ENCOUNTER — Telehealth: Payer: Self-pay

## 2020-05-15 NOTE — Progress Notes (Signed)
Chronic Care Management Pharmacy Assistant   Name: Roger Stewart  MRN: [id number] DOB: [birth year]  Reason for Encounter: Hypertension  Disease State Call.  PCP : Libby Maw, MD  Allergies:   Allergies  Allergen Reactions  . Norvasc [Amlodipine Besylate] Other (See Comments)    Body aches    Medications: Outpatient Encounter Medications as of 05/15/2020  Medication Sig Note  . atorvastatin (LIPITOR) 20 MG tablet TAKE 1 TABLET(20 MG) BY MOUTH DAILY   . carbamide peroxide (DEBROX) 6.5 % OTIC solution Place 5 drops in each ear and plug with cotton for 1 hour and then remove.   . cilostazol (PLETAL) 100 MG tablet TAKE 1 TABLET BY MOUTH TWICE A DAY   . diclofenac (VOLTAREN) 25 MG EC tablet Take 1 tablet (25 mg total) by mouth 2 (two) times daily as needed. 11/29/2019: PRN  . lisinopril (ZESTRIL) 20 MG tablet TAKE 1 TABLET(20 MG) BY MOUTH DAILY   . metoprolol tartrate (LOPRESSOR) 25 MG tablet Take 1 tablet (25 mg total) by mouth daily.    No facility-administered encounter medications on file as of 05/15/2020.    Current Diagnosis: Patient Active Problem List   Diagnosis Date Noted  . Anemia 03/22/2020  . History of gout 11/29/2019  . Ascending aorta dilatation ([facility]) 06/28/2019  . Atherosclerotic vascular disease 06/03/2019  . Chest discomfort 06/03/2019  . H/O aortic aneurysm repair 06/03/2019  . PVD (peripheral vascular disease) ([place]) 03/18/2019  . Intermittent claudication ([place]) 01/01/2019  . Elevated LDL cholesterol level 05/29/2018  . Benign localized prostatic hyperplasia with lower urinary tract symptoms (LUTS) 05/26/2017  . Elevated blood sugar 05/26/2017  . Excessive cerumen in both ear canals 05/26/2017  . Tobacco use 05/26/2017  . Encounter for screening for lung cancer 05/26/2017  . Sciatica of right side 05/12/2017  . Acute bronchitis with COPD ([place]) 05/12/2017  . Screening for hyperlipidemia 05/23/2016  . Other abnormal glucose 05/22/2012  .  Nonspecific abnormal electrocardiogram (ECG) (EKG) 05/22/2012  . Family history of prostate cancer 05/22/2012  . INGUINAL HERNIA, LEFT, SMALL 05/10/2010  . SPINAL STENOSIS, LUMBAR 04/26/2009  . Lumbar radiculopathy 04/26/2009  . DIVERTICULOSIS, COLON 12/20/2008  . History of colonic polyps 12/20/2008  . Mixed hyperlipidemia 08/06/2007  . Essential hypertension 08/06/2007  . SKIN CANCER, HX OF 08/06/2007    Goals Addressed   None     Follow-Up:  Pharmacist Review   Reviewed chart prior to disease state call. Spoke with patient regarding BP  Recent Office Vitals: BP Readings from Last 3 Encounters:  03/22/20 100/64  11/29/19 112/74  06/28/19 130/86   Pulse Readings from Last 3 Encounters:  03/22/20 84  11/29/19 87  06/28/19 92    Wt Readings from Last 3 Encounters:  03/22/20 209 lb 9.6 oz (95.1 kg)  12/29/19 200 lb (90.7 kg)  11/29/19 200 lb 9.6 oz (91 kg)     Kidney Function Lab Results  Component Value Date/Time   CREATININE 0.81 11/29/2019 09:43 AM   CREATININE 0.82 05/31/2019 11:10 AM   GFR 92.96 11/29/2019 09:43 AM   GFRNONAA 90.35 12/20/2008 12:00 AM   GFRAA 110 12/24/2007 12:00 AM    BMP Latest Ref Rng & Units 11/29/2019 05/31/2019 03/18/2019  Glucose 70 - 99 mg/dL 115(H) 117(H) 99  BUN 6 - 23 mg/dL 18 17 12   Creatinine 0.40 - 1.50 mg/dL 0.81 0.82 0.71  Sodium 135 - 145 mEq/L 141 138 138  Potassium 3.5 - 5.1 mEq/L 4.0 4.5 4.6  Chloride  96 - 112 mEq/L 105 103 103  CO2 19 - 32 mEq/L 29 27 29   Calcium 8.4 - 10.5 mg/dL 8.6 9.4 9.4    . Current antihypertensive regimen:  ? Lisinopril 20 MG Daily ? Metoprolol 25 MG Daily . How often are you checking your Blood Pressure? 1-2x per week . Current home BP readings: Patient states his blood pressure has been ranging around 120-124/80's. . What recent interventions/DTPs have been made by any provider to improve Blood Pressure control since last CPP Visit: None . Any recent hospitalizations or ED visits since  last visit with CPP? No . What diet changes have been made to improve Blood Pressure Control?  o Patient states [place], but he cooks at home for breakfast and eats out for dinner. . What exercise is being done to improve your Blood Pressure Control?  o Patient reports he has not been exercising as he should be  because he is busy moving at the moment.  Adherence Review: Is the patient currently on ACE/ARB medication? Yes Does the patient have >5 day gap between last estimated fill dates? Yes   [place] Pharmacist Assistant [phone]

## 2020-05-24 ENCOUNTER — Other Ambulatory Visit: Payer: Self-pay | Admitting: Family Medicine

## 2020-05-24 DIAGNOSIS — I10 Essential (primary) hypertension: Secondary | ICD-10-CM

## 2020-05-25 ENCOUNTER — Ambulatory Visit (INDEPENDENT_AMBULATORY_CARE_PROVIDER_SITE_OTHER): Payer: Medicare HMO | Admitting: Otolaryngology

## 2020-06-11 ENCOUNTER — Other Ambulatory Visit: Payer: Self-pay | Admitting: Family Medicine

## 2020-06-11 DIAGNOSIS — E7800 Pure hypercholesterolemia, unspecified: Secondary | ICD-10-CM

## 2020-06-13 ENCOUNTER — Telehealth: Payer: Self-pay

## 2020-06-13 NOTE — Progress Notes (Signed)
Chronic Care Management Pharmacy Assistant   Name: Roger Stewart  MRN: [id number] DOB: 17-Mar-1946  Reason for Encounter:Hypertension and Hyperlipidemia  Disease State Call.  PCP : Mliss Sax, MD  Allergies:   Allergies  Allergen Reactions  . Norvasc [Amlodipine Besylate] Other (See Comments)    Body aches    Medications: Outpatient Encounter Medications as of 06/13/2020  Medication Sig Note  . atorvastatin (LIPITOR) 20 MG tablet TAKE 1 TABLET BY MOUTH EVERY DAY   . carbamide peroxide (DEBROX) 6.5 % OTIC solution Place 5 drops in each ear and plug with cotton for 1 hour and then remove.   . cilostazol (PLETAL) 100 MG tablet TAKE 1 TABLET BY MOUTH TWICE A DAY   . diclofenac (VOLTAREN) 25 MG EC tablet Take 1 tablet (25 mg total) by mouth 2 (two) times daily as needed. 11/29/2019: PRN  . lisinopril (ZESTRIL) 20 MG tablet TAKE 1 TABLET(20 MG) BY MOUTH DAILY   . metoprolol tartrate (LOPRESSOR) 25 MG tablet TAKE 1 TABLET BY MOUTH EVERY DAY    No facility-administered encounter medications on file as of 06/13/2020.    Current Diagnosis: Patient Active Problem List   Diagnosis Date Noted  . Anemia 03/22/2020  . History of gout 11/29/2019  . Ascending aorta dilatation ([facility]) 06/28/2019  . Atherosclerotic vascular disease 06/03/2019  . Chest discomfort 06/03/2019  . H/O aortic aneurysm repair 06/03/2019  . PVD (peripheral vascular disease) ([facility]) 03/18/2019  . Intermittent claudication ([facility]) 01/01/2019  . Elevated LDL cholesterol level 05/29/2018  . Benign localized prostatic hyperplasia with lower urinary tract symptoms (LUTS) 05/26/2017  . Elevated blood sugar 05/26/2017  . Excessive cerumen in both ear canals 05/26/2017  . Tobacco use 05/26/2017  . Encounter for screening for lung cancer 05/26/2017  . Sciatica of right side 05/12/2017  . Acute bronchitis with COPD ([facility]) 05/12/2017  . Screening for hyperlipidemia 05/23/2016  . Other abnormal glucose 05/22/2012  .  Nonspecific abnormal electrocardiogram (ECG) (EKG) 05/22/2012  . Family history of prostate cancer 05/22/2012  . INGUINAL HERNIA, LEFT, SMALL 05/10/2010  . SPINAL STENOSIS, LUMBAR 04/26/2009  . Lumbar radiculopathy 04/26/2009  . DIVERTICULOSIS, COLON 12/20/2008  . History of colonic polyps 12/20/2008  . Mixed hyperlipidemia 08/06/2007  . Essential hypertension 08/06/2007  . SKIN CANCER, HX OF 08/06/2007    Goals Addressed   None    Reviewed chart prior to disease state call. Spoke with patient regarding BP  Recent Office Vitals: BP Readings from Last 3 Encounters:  03/22/20 100/64  11/29/19 112/74  06/28/19 130/86   Pulse Readings from Last 3 Encounters:  03/22/20 84  11/29/19 87  06/28/19 92    Wt Readings from Last 3 Encounters:  03/22/20 209 lb 9.6 oz (95.1 kg)  12/29/19 200 lb (90.7 kg)  11/29/19 200 lb 9.6 oz (91 kg)     Kidney Function Lab Results  Component Value Date/Time   CREATININE 0.81 11/29/2019 09:43 AM   CREATININE 0.82 05/31/2019 11:10 AM   GFR 92.96 11/29/2019 09:43 AM   GFRNONAA 90.35 12/20/2008 12:00 AM   GFRAA 110 12/24/2007 12:00 AM    BMP Latest Ref Rng & Units 11/29/2019 05/31/2019 03/18/2019  Glucose 70 - 99 mg/dL 185(U) 314(H) 99  BUN 6 - 23 mg/dL 18 17 12   Creatinine 0.40 - 1.50 mg/dL 7.02 6.37  Sodium 135 - 145 mEq/L 141 138 138  Potassium 3.5 - 5.1 mEq/L 4.0 4.5 4.6  Chloride 96 - 112 mEq/L 105 103 103  CO2 19 - 32 mEq/L 29 27 29   Calcium 8.4 - 10.5 mg/dL 8.6 9.4 9.4    . Current antihypertensive regimen:  ? Lisinopril 20 MG Daily ? Metoprolol 25 MG Daily  . How often are you checking your Blood Pressure? weekly . Current home BP readings:  o Patient states his blood pressure has been ranging around 117/80"s. . What recent interventions/DTPs have been made by any provider to improve Blood Pressure control since last CPP Visit: None ID  . Any recent hospitalizations or ED visits since last visit with CPP? No . What diet  changes have been made to improve Blood Pressure Control?  ? Patient states [place] , but he cooks at home for breakfast and eats out for dinner.  . What exercise is being done to improve your Blood Pressure Control?  ? Patient reports he has a workout room at his new house , and he will start working out.   Adherence Review: Is the patient currently on ACE/ARB medication? Yes Does the patient have >5 day gap between last estimated fill dates? No   06/13/2020 Name: Roger Stewart MRN: Rolley Sims DOB: 09/28/1945 Roger Stewart is a 75 y.o. 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  Comprehensive medication review performed; Spoke to patient regarding cholesterol  Lipid Panel    Component Value Date/Time   CHOL 130 11/29/2019 0943   CHOL 251 (H) 05/18/2014 1201   TRIG 58.0 11/29/2019 0943   TRIG 184 (H) 05/18/2014 1201   TRIG 175 (H) 05/14/2006 1031   HDL 63.00 11/29/2019 0943   HDL 63 05/18/2014 1201   LDLCALC 56 11/29/2019 0943   LDLCALC 151 (H) 05/18/2014 1201   LDLDIRECT 52.0 11/29/2019 0943    10-year ASCVD risk score: The 10-year ASCVD risk score 12/01/2019 DC Denman George., et al., 2013) is: 16.9%   Values used to calculate the score:     Age: 75 years     Sex: Male     Is Non-Hispanic African American: No     Diabetic: No     Tobacco smoker: Yes     Systolic Blood Pressure: 100 mmHg     Is BP treated: Yes     HDL Cholesterol: 63 mg/dL     Total Cholesterol: 130 mg/dL  . Current antihyperlipidemic regimen:    Atorvastatin 20 mg daily              Cilostazole 100 mg BID  . Previous antihyperlipidemic medications tried: N/A . ASCVD risk enhancing conditions: HTN . What recent interventions/DTPs have been made by any provider to improve Cholesterol control since last CPP Visit: None ID  . Any recent hospitalizations or ED visits since last visit with CPP? No . What diet changes have been made to improve Cholesterol?   o Patient states [place] , but he cooks at home for breakfast and eats out for dinner. . What exercise is being done to improve Cholesterol?  ? Patient reports he has a workout room at his new house , and he will start working out.  Adherence Review: Does the patient have >5 day gap between last estimated fill dates? Yes   Patient states his cilostazol he is taking seems not to be working.Patient reports  He has  been on cilostazol about a year and still has the same effect when he started it.Patient reports  when he walks  about a quarter of a mile his leg will tighten up (he give a  7 out of 10 pain score, kinda like a cramp pain ) after he walks some more the pain ease down (he gives either a 3 or 4 out of 10 pain score). Will Notify Clinical pharmacist.   Lottie Mussel  Follow-Up:  Pharmacist Review   Everlean Cherry Clinical Pharmacist Assistant [phone]

## 2020-07-20 ENCOUNTER — Telehealth: Payer: Self-pay

## 2020-07-20 NOTE — Progress Notes (Signed)
Spoke to patient to confirmed patient telephone appointment on 07/21/2020 for CCM at 2:00 pm with Junius Argyle the Clinical pharmacist.    Patient states he has no issues at this time with current medication. Patient denies any side effects with his medication.  Patient states he needs to reschedule appointment to a different time. Notified Clinical Pharmacist.    Anderson Malta Clinical Pharmacist Assistant [phone] (8 minutes spent)

## 2020-07-21 ENCOUNTER — Telehealth: Payer: Medicare HMO

## 2020-07-27 ENCOUNTER — Other Ambulatory Visit: Payer: Self-pay | Admitting: Family Medicine

## 2020-07-27 DIAGNOSIS — I739 Peripheral vascular disease, unspecified: Secondary | ICD-10-CM

## 2020-08-21 ENCOUNTER — Other Ambulatory Visit: Payer: Self-pay | Admitting: Family Medicine

## 2020-09-07 ENCOUNTER — Telehealth: Payer: Self-pay

## 2020-09-07 NOTE — Progress Notes (Signed)
Chronic Care Management Pharmacy Assistant   Name: Roger Stewart  MRN: [id number] DOB: 01/07/1946  Reason for [place] Call.   Recent office visits:  None ID  Recent consult visits:  None ID  Hospital visits:  None in previous 6 months  Medications: Outpatient Encounter Medications as of 09/07/2020  Medication Sig Note  . atorvastatin (LIPITOR) 20 MG tablet TAKE 1 TABLET BY MOUTH EVERY DAY   . carbamide peroxide (DEBROX) 6.5 % OTIC solution Place 5 drops in each ear and plug with cotton for 1 hour and then remove.   . cilostazol (PLETAL) 100 MG tablet TAKE 1 TABLET BY MOUTH TWICE A DAY   . diclofenac (VOLTAREN) 25 MG EC tablet Take 1 tablet (25 mg total) by mouth 2 (two) times daily as needed. 11/29/2019: PRN  . lisinopril (ZESTRIL) 20 MG tablet TAKE 1 TABLET BY MOUTH EVERY DAY** Needs appt before anymore refills given.**   . metoprolol tartrate (LOPRESSOR) 25 MG tablet TAKE 1 TABLET BY MOUTH EVERY DAY    No facility-administered encounter medications on file as of 09/07/2020.    Star Rating Drugs:atorvastatin 20 mg,lisinopril 20 mg,  Reviewed chart prior to disease state call. Spoke with patient regarding BP  Recent Office Vitals: BP Readings from Last 3 Encounters:  03/22/20 100/64  11/29/19 112/74  06/28/19 130/86   Pulse Readings from Last 3 Encounters:  03/22/20 84  11/29/19 87  06/28/19 92    Wt Readings from Last 3 Encounters:  03/22/20 209 lb 9.6 oz (95.1 kg)  12/29/19 200 lb (90.7 kg)  11/29/19 200 lb 9.6 oz (91 kg)     Kidney Function Lab Results  Component Value Date/Time   CREATININE 0.81 11/29/2019 09:43 AM   CREATININE 0.82 05/31/2019 11:10 AM   GFR 92.96 11/29/2019 09:43 AM   GFRNONAA 90.35 12/20/2008 12:00 AM   GFRAA 110 12/24/2007 12:00 AM    BMP Latest Ref Rng & Units 11/29/2019 05/31/2019 03/18/2019  Glucose 70 - 99 mg/dL 115(H) 117(H) 99  BUN 6 - 23 mg/dL 18 17 12   Creatinine 0.40 - 1.50 mg/dL 0.81 0.82  0.71  Sodium 135 - 145 mEq/L 141 138 138  Potassium 3.5 - 5.1 mEq/L 4.0 4.5 4.6  Chloride 96 - 112 mEq/L 105 103 103  CO2 19 - 32 mEq/L 29 27 29   Calcium 8.4 - 10.5 mg/dL 8.6 9.4 9.4    . Current antihypertensive regimen:  ? Lisinopril 20 MG Daily ? Metoprolol 25 MG Daily  . How often are you checking your Blood Pressure? weekly . Current home BP readings:  o Patient states his blood pressure ranges around 121-125/75-80. o Patient states the only problem he is having is when he is sitting down and stands up he will get dizzy.Patient states the dizziness last about10 seconds, and his blood pressure is around 115/70.Patient states this happens once in awhile not all the time. . What recent interventions/DTPs have been made by any provider to improve Blood Pressure control since last CPP Visit: None ID . Any recent hospitalizations or ED visits since last visit with CPP? No . What diet changes have been made to improve Blood Pressure Control?  o None ID . What exercise is being done to improve your Blood Pressure Control?  o Patient states he does not exercise as much as he wants due to a blood clot in his leg.  Patient states he is going to call his PCP to schedule appointment to follow up  with his PCP.  Adherence Review: Is the patient currently on ACE/ARB medication? Yes Does the patient have >5 day gap between last estimated fill dates? No   Anderson [place] Clinical Production designer, theatre/television/film [phone]

## 2020-10-09 ENCOUNTER — Encounter: Payer: Self-pay | Admitting: Family Medicine

## 2020-10-09 ENCOUNTER — Other Ambulatory Visit: Payer: Self-pay

## 2020-10-09 ENCOUNTER — Ambulatory Visit (INDEPENDENT_AMBULATORY_CARE_PROVIDER_SITE_OTHER): Payer: Medicare HMO | Admitting: Family Medicine

## 2020-10-09 VITALS — BP 102/64 | HR 88 | Temp 97.4°F | Ht 72.0 in | Wt 206.6 lb

## 2020-10-09 DIAGNOSIS — I10 Essential (primary) hypertension: Secondary | ICD-10-CM | POA: Diagnosis not present

## 2020-10-09 DIAGNOSIS — I739 Peripheral vascular disease, unspecified: Secondary | ICD-10-CM | POA: Diagnosis not present

## 2020-10-09 DIAGNOSIS — E782 Mixed hyperlipidemia: Secondary | ICD-10-CM

## 2020-10-09 DIAGNOSIS — Z720 Tobacco use: Secondary | ICD-10-CM | POA: Diagnosis not present

## 2020-10-09 DIAGNOSIS — H6123 Impacted cerumen, bilateral: Secondary | ICD-10-CM

## 2020-10-09 DIAGNOSIS — E7800 Pure hypercholesterolemia, unspecified: Secondary | ICD-10-CM

## 2020-10-09 NOTE — Progress Notes (Signed)
Established Patient Office Visit  Subjective:  Patient ID: Roger Stewart, male    DOB: 1946-02-28  Age: 75 y.o. MRN: [id number]  CC:  Chief Complaint  Patient presents with  . Annual Exam    CPE, patient would like to discuss clot in the calf of right leg, concerns about soft stools since colonoscopy. Allergies bothering him.     HPI Roger Stewart presents for follow-up of hypertension, elevated cholesterol, peripheral vascular disease with increasing claudication pain in the right calf area and ceruminosis of both ears.  Claudication pains in his right calf seem to be worsening.  The distance that he has to walk to bring them on it is less.  Pain resolves after a minute or so of rest he continues to smoke cigars.  LDL levels are not ideal.  He has been resistant to increasing his statin.  Blood pressure has been well controlled.  Past Medical History:  Diagnosis Date  . Cancer ([place])    Basal Cell X 8; Dr Nevada Crane  . Colon polyps    Dr Amedeo Plenty  . Diverticulosis of colon   . Hyperlipidemia    elevated TG  . Hypertension   . Sciatica     Past Surgical History:  Procedure Laterality Date  . ABDOMINAL AORTIC ANEURYSM REPAIR  2005  . colonoscopy with polypectomy  2010;2016   Dr Amedeo Plenty  . ESI X3     LS spine  . LUMBAR SPINE SURGERY  04/2010   spur & bulging disc L 4-5, [place] , [place]  08/2010   cyst resected @ op site    Family History  Problem Relation Age of Onset  . Heart disease Mother        CHF ,CABG  . Prostate cancer Father   . Hypertension Father   . Transient ischemic attack Father        in 69s  . Hypertension Brother   . Acromegaly Brother   . Prostate cancer Brother   . Leukemia Brother   . Diabetes Neg Hx     Social History   Socioeconomic History  . Marital status: Legally Separated    Spouse name: Not on file  . Number of children: 1  . Years of education: 57  . Highest education level: Not on file  Occupational History  .  Not on file  Tobacco Use  . Smoking status: Current Every Day Smoker    Types: Cigars    Last attempt to quit: 06/10/2000    Years since quitting: 20.3  . Smokeless tobacco: Never Used  . Tobacco comment:  Smoked [phone], up to 3/4 ppd. 05/22/15 1 cigar a day. 10/26/19 2-4 cigars daily  Vaping Use  . Vaping Use: Never used  Substance and Sexual Activity  . Alcohol use: Yes    Alcohol/week: 16.0 standard drinks    Types: 2 Glasses of wine, 14 Cans of beer per week  . Drug use: No  . Sexual activity: Not on file  Other Topics Concern  . Not on file  Social History Narrative   Denies abuse and feels safe at home.    Social Determinants of Health   Financial Resource Strain: Low Risk   . Difficulty of Paying Living Expenses: Not hard at all  Food Insecurity: No Food Insecurity  . Worried About Charity fundraiser in the Last Year: Never true  . Ran Out of Food in the Last Year: Never true  Transportation Needs: No Transportation Needs  . Lack of Transportation (Medical): No  . Lack of Transportation (Non-Medical): No  Physical Activity: Insufficiently Active  . Days of Exercise per Week: 4 days  . Minutes of Exercise per Session: 30 min  Stress: No Stress Concern Present  . Feeling of Stress : Not at all  Social Connections: Moderately Isolated  . Frequency of Communication with Friends and Family: More than three times a week  . Frequency of Social Gatherings with Friends and Family: More than three times a week  . Attends Religious Services: Never  . Active Member of Clubs or Organizations: Yes  . Attends Archivist Meetings: More than 4 times per year  . Marital Status: Separated  Intimate Partner Violence: Not At Risk  . Fear of Current or Ex-Partner: No  . Emotionally Abused: No  . Physically Abused: No  . Sexually Abused: No    Outpatient Medications Prior to Visit  Medication Sig Dispense Refill  . atorvastatin (LIPITOR) 20 MG tablet TAKE 1 TABLET BY  MOUTH EVERY DAY 90 tablet 1  . cilostazol (PLETAL) 100 MG tablet TAKE 1 TABLET BY MOUTH TWICE A DAY 180 tablet 0  . lisinopril (ZESTRIL) 20 MG tablet TAKE 1 TABLET BY MOUTH EVERY DAY** Needs appt before anymore refills given.** 90 tablet 0  . metoprolol tartrate (LOPRESSOR) 25 MG tablet TAKE 1 TABLET BY MOUTH EVERY DAY 90 tablet 3  . carbamide peroxide (DEBROX) 6.5 % OTIC solution Place 5 drops in each ear and plug with cotton for 1 hour and then remove. (Patient not taking: Reported on 10/09/2020) 15 mL 0  . diclofenac (VOLTAREN) 25 MG EC tablet Take 1 tablet (25 mg total) by mouth 2 (two) times daily as needed. (Patient not taking: Reported on 10/09/2020) 180 tablet 0  . cilostazol (PLETAL) 100 MG tablet 1 tablet 30 minutes before or 2 hours after breakfast and dinner     No facility-administered medications prior to visit.    Allergies  Allergen Reactions  . Norvasc [Amlodipine Besylate] Other (See Comments)    Body aches    ROS Review of Systems  Constitutional: Negative.   HENT: Negative.   Eyes: Negative for photophobia and visual disturbance.  Respiratory: Negative.   Cardiovascular: Negative.   Gastrointestinal: Negative.   Endocrine: Negative for polyphagia and polyuria.  Genitourinary: Negative for difficulty urinating.  Musculoskeletal: Positive for gait problem and myalgias.  Neurological: Negative for speech difficulty and weakness.  Hematological: Does not bruise/bleed easily.  Psychiatric/Behavioral: Negative.       Objective:    Physical Exam Vitals and nursing note reviewed.  Constitutional:      General: He is not in acute distress.    Appearance: Normal appearance. He is not ill-appearing, toxic-appearing or diaphoretic.  HENT:     Head: Normocephalic and atraumatic.     Right Ear: There is impacted cerumen.     Left Ear: There is impacted cerumen.     Mouth/Throat:     Mouth: Mucous membranes are moist.     Pharynx: Oropharynx is clear. No oropharyngeal  exudate or posterior oropharyngeal erythema.  Eyes:     General:        Right eye: No discharge.        Left eye: No discharge.     Extraocular Movements: Extraocular movements intact.     Conjunctiva/sclera: Conjunctivae normal.     Pupils: Pupils are equal, round, and reactive to light.  Neck:  Vascular: No carotid bruit.  Cardiovascular:     Rate and Rhythm: Normal rate and regular rhythm.     Pulses:          Carotid pulses are 2+ on the right side and 2+ on the left side.      Dorsalis pedis pulses are 1+ on the right side and 1+ on the left side.       Posterior tibial pulses are 1+ on the right side and 1+ on the left side.  Pulmonary:     Effort: Pulmonary effort is normal.     Breath sounds: Normal breath sounds.  Abdominal:     General: Bowel sounds are normal.  Musculoskeletal:     Cervical back: No rigidity or tenderness.     Right lower leg: No edema.     Left lower leg: No edema.  Lymphadenopathy:     Cervical: No cervical adenopathy.  Skin:    General: Skin is warm and dry.  Neurological:     Mental Status: He is alert and oriented to person, place, and time.  Psychiatric:        Mood and Affect: Mood normal.        Behavior: Behavior normal.    Subjective:    Roger Stewart is a 75 y.o. male whom I am asked to see for evaluation of diminished hearing in both ears for the past 6 months. There is a prior history of cerumen impaction. The patient has not been using ear drops to loosen wax immediately prior to this visit. The patient denies ear pain.  The patient's history has been marked as reviewed and updated as appropriate.  Review of Systems Pertinent items are noted in HPI.    Objective:    Auditory canal(s) of both ears are completely obstructed with cerumen.   Cerumen was removed using gentle irrigation. Tympanic membranes are intact following the procedure.  Auditory canals were not well-visualized due to the impaction..    Assessment:     Cerumen Impaction without otitis externa.    Plan:    1. Care instructions given. 2. Home treatment: We will start to use his Debrox drops regularly and daily for the next few weeks.  He will then return for additional irrigation.. 3. Follow-up as needed.   BP 102/64   Pulse 88   Temp (!) 97.4 F (36.3 C) (Temporal)   Ht 6' (1.829 m)   Wt 206 lb 9.6 oz (93.7 kg)   SpO2 95%   BMI 28.02 kg/m  Wt Readings from Last 3 Encounters:  10/09/20 206 lb 9.6 oz (93.7 kg)  03/22/20 209 lb 9.6 oz (95.1 kg)  12/29/19 200 lb (90.7 kg)     Health Maintenance Due  Topic Date Due  . PNA vac Low Risk Adult (1 of 2 - PCV13) Never done  . TETANUS/TDAP  05/10/2020    There are no preventive care reminders to display for this patient.  Lab Results  Component Value Date   TSH 1.86 05/27/2018   Lab Results  Component Value Date   WBC 7.1 03/22/2020   HGB 12.9 (L) 03/22/2020   HCT 37.7 (L) 03/22/2020   MCV 96.9 03/22/2020   PLT 200.0 03/22/2020   Lab Results  Component Value Date   NA 141 11/29/2019   K 4.0 11/29/2019   CO2 29 11/29/2019   GLUCOSE 115 (H) 11/29/2019   BUN 18 11/29/2019   CREATININE 0.81 11/29/2019   BILITOT 0.7 11/29/2019  ALKPHOS 68 11/29/2019   AST 16 11/29/2019   ALT 12 11/29/2019   PROT 5.9 (L) 11/29/2019   ALBUMIN 3.5 11/29/2019   CALCIUM 8.6 11/29/2019   GFR 92.96 11/29/2019   Lab Results  Component Value Date   CHOL 130 11/29/2019   Lab Results  Component Value Date   HDL 63.00 11/29/2019   Lab Results  Component Value Date   LDLCALC 56 11/29/2019   Lab Results  Component Value Date   TRIG 58.0 11/29/2019   Lab Results  Component Value Date   CHOLHDL 2 11/29/2019   Lab Results  Component Value Date   HGBA1C 6.0 05/27/2018      Assessment & Plan:   Problem List Items Addressed This Visit      Cardiovascular and Mediastinum   Essential hypertension   Relevant Orders   CBC   Comprehensive metabolic panel   Urinalysis, Routine  w reflex microscopic   PVD (peripheral vascular disease) ([place]) - Primary   Relevant Orders   Comprehensive metabolic panel   Lipid panel   Ambulatory referral to Vascular Surgery   VAS [place] ABI WITH/WO TBI     Nervous and Auditory   Excessive cerumen in both ear canals     Other   Mixed hyperlipidemia   Relevant Orders   Comprehensive metabolic panel   Lipid panel   Tobacco use   Elevated LDL cholesterol level   Relevant Orders   Lipid panel   Intermittent claudication ([place])   Relevant Orders   Lipid panel   Ambulatory referral to Vascular Surgery   VAS [place] ABI WITH/WO TBI      No orders of the defined types were placed in this encounter.   Follow-up: Return in about 2 weeks (around 10/23/2020), or STOP SMOKING.Roger Stewart to go back to see Dr. Carlis Abbott about the apparent claudication pain in his right calf.  Stressed the importance of stopping smoking even if it is just "cigars on occasion".  Discussed increasing Lipitor.  Discussed using Pletal.  Patient does not tolerate amlodipine.  Patient did say that he has an ENT likes to go to 4 ear irrigation.  He will let me know who that he has.  Will return here if that doctor is not available.   Libby Maw, MD

## 2020-10-09 NOTE — Patient Instructions (Addendum)
Preventing High Cholesterol Cholesterol is a white, waxy substance similar to fat that the human body needs to help build cells. The liver makes all the cholesterol that a person's body needs. Having high cholesterol (hypercholesterolemia) increases your risk for heart disease and stroke. Extra or excess cholesterol comes from the food that you eat. High cholesterol can often be prevented with diet and lifestyle changes. If you already have high cholesterol, you can control it with diet, lifestyle changes, and medicines. How can high cholesterol affect me? If you have high cholesterol, fatty deposits (plaques) may build up on the walls of your blood vessels. The blood vessels that carry blood away from your heart are called arteries. Plaques make the arteries narrower and stiffer. This in turn can:  Restrict or block blood flow and cause blood clots to form.  Increase your risk for heart attack and stroke. What can increase my risk for high cholesterol? This condition is more likely to develop in people who:  Eat foods that are high in saturated fat or cholesterol. Saturated fat is mostly found in foods that come from animal sources.  Are overweight.  Are not getting enough exercise.  Have a family history of high cholesterol (familial hypercholesterolemia). What actions can I take to prevent this? Nutrition  Eat less saturated fat.  Avoid trans fats (partially hydrogenated oils). These are often found in margarine and in some baked goods, fried foods, and snacks bought in packages.  Avoid precooked or cured meat, such as bacon, sausages, or meat loaves.  Avoid foods and drinks that have added sugars.  Eat more fruits, vegetables, and whole grains.  Choose healthy sources of protein, such as fish, poultry, lean cuts of red meat, beans, peas, lentils, and nuts.  Choose healthy sources of fat, such as: ? Nuts. ? Vegetable oils, especially olive oil. ? Fish that have healthy fats,  such as omega-3 fatty acids. These fish include mackerel or salmon.   Lifestyle  Lose weight if you are overweight. Maintaining a healthy body mass index (BMI) can help prevent or control high cholesterol. It can also lower your risk for diabetes and high blood pressure. Ask your health care provider to help you with a diet and exercise plan to lose weight safely.  Do not use any products that contain nicotine or tobacco, such as cigarettes, e-cigarettes, and chewing tobacco. If you need help quitting, ask your health care provider. Alcohol use  Do not drink alcohol if: ? Your health care provider tells you not to drink. ? You are pregnant, may be pregnant, or are planning to become pregnant.  If you drink alcohol: ? Limit how much you use to:  0-1 drink a day for women.  0-2 drinks a day for men. ? Be aware of how much alcohol is in your drink. In the [place] one drink equals one 12 oz bottle of beer (355 mL), one 5 oz glass of wine (148 mL), or one 1 oz glass of hard liquor (44 mL). Activity  Get enough exercise. Do exercises as told by your health care provider.  Each week, do at least 150 minutes of exercise that takes a medium level of effort (moderate-intensity exercise). This kind of exercise: ? Makes your heart beat faster while allowing you to still be able to talk. ? Can be done in short sessions several times a day or longer sessions a few times a week. For example, on 5 days each week, you could walk fast or ride   your bike 3 times a day for 10 minutes each time.   Medicines  Your health care provider may recommend medicines to help lower cholesterol. This may be a medicine to lower the amount of cholesterol that your liver makes. You may need medicine if: ? Diet and lifestyle changes have not lowered your cholesterol enough. ? You have high cholesterol and other risk factors for heart disease or stroke.  Take over-the-counter and prescription medicines only as told by your  health care provider. General information  Manage your risk factors for high cholesterol. Talk with your health care provider about all your risk factors and how to lower your risk.  Manage other conditions that you have, such as diabetes or high blood pressure (hypertension).  Have blood tests to check your cholesterol levels at regular points in time as told by your health care provider.  Keep all follow-up visits as told by your health care provider. This is important. Where to find more information  American Heart Association: www.heart.org  National Heart, Lung, and Blood Institute: https://wilson-eaton.com/ Summary  High cholesterol increases your risk for heart disease and stroke. By keeping your cholesterol level low, you can reduce your risk for these conditions.  High cholesterol can often be prevented with diet and lifestyle changes.  Work with your health care provider to manage your risk factors, and have your blood tested regularly. This information is not intended to replace advice given to you by your health care provider. Make sure you discuss any questions you have with your health care provider. Document Revised: 03/09/2019 Document Reviewed: 03/09/2019 Elsevier Patient Education  2021 [place], Adult The ears produce a substance called earwax that helps keep bacteria out of the ear and protects the skin in the ear canal. Occasionally, earwax can build up in the ear and cause discomfort or hearing loss. What are the causes? This condition is caused by a buildup of earwax. Ear canals are self-cleaning. Ear wax is made in the outer part of the ear canal and generally falls out in small amounts over time. When the self-cleaning mechanism is not working, earwax builds up and can cause decreased hearing and discomfort. Attempting to clean ears with cotton swabs can push the earwax deep into the ear canal and cause decreased hearing and pain. What increases  the risk? This condition is more likely to develop in people who:  Clean their ears often with cotton swabs.  Pick at their ears.  Use earplugs or in-ear headphones often, or wear hearing aids. The following factors may also make you more likely to develop this condition:  Being male.  Being of older age.  Naturally producing more earwax.  Having narrow ear canals.  Having earwax that is overly thick or sticky.  Having excess hair in the ear canal.  Having eczema.  Being dehydrated. What are the signs or symptoms? Symptoms of this condition include:  Reduced or muffled hearing.  A feeling of fullness in the ear or feeling that the ear is plugged.  Fluid coming from the ear.  Ear pain or an itchy ear.  Ringing in the ear.  Coughing.  Balance problems.  An obvious piece of earwax that can be seen inside the ear canal. How is this diagnosed? This condition may be diagnosed based on:  Your symptoms.  Your medical history.  An ear exam. During the exam, your health care provider will look into your ear with an instrument called an otoscope. You  may have tests, including a hearing test. How is this treated? This condition may be treated by:  Using ear drops to soften the earwax.  Having the earwax removed by a health care provider. The health care provider may: ? Flush the ear with water. ? Use an instrument that has a loop on the end (curette). ? Use a suction device.  Having surgery to remove the wax buildup. This may be done in severe cases. Follow these instructions at home:  Take over-the-counter and prescription medicines only as told by your health care provider.  Do not put any objects, including cotton swabs, into your ear. You can clean the opening of your ear canal with a washcloth or facial tissue.  Follow instructions from your health care provider about cleaning your ears. Do not overclean your ears.  Drink enough fluid to keep your urine  pale yellow. This will help to thin the earwax.  Keep all follow-up visits as told. If earwax builds up in your ears often or if you use hearing aids, consider seeing your health care provider for routine, preventive ear cleanings. Ask your health care provider how often you should schedule your cleanings.  If you have hearing aids, clean them according to instructions from the manufacturer and your health care provider.   Contact a health care provider if:  You have ear pain.  You develop a fever.  You have pus or other fluid coming from your ear.  You have hearing loss.  You have ringing in your ears that does not go away.  You feel like the room is spinning (vertigo).  Your symptoms do not improve with treatment. Get help right away if:  You have bleeding from the affected ear.  You have severe ear pain. Summary  Earwax can build up in the ear and cause discomfort or hearing loss.  The most common symptoms of this condition include reduced or muffled hearing, a feeling of fullness in the ear, or feeling that the ear is plugged.  This condition may be diagnosed based on your symptoms, your medical history, and an ear exam.  This condition may be treated by using ear drops to soften the earwax or by having the earwax removed by a health care provider.  Do not put any objects, including cotton swabs, into your ear. You can clean the opening of your ear canal with a washcloth or facial tissue. This information is not intended to replace advice given to you by your health care provider. Make sure you discuss any questions you have with your health care provider. Document Revised: 09/14/2019 Document Reviewed: 09/14/2019 Elsevier Patient Education  [place].

## 2020-10-10 ENCOUNTER — Other Ambulatory Visit (INDEPENDENT_AMBULATORY_CARE_PROVIDER_SITE_OTHER): Payer: Medicare HMO

## 2020-10-10 DIAGNOSIS — I739 Peripheral vascular disease, unspecified: Secondary | ICD-10-CM | POA: Diagnosis not present

## 2020-10-10 DIAGNOSIS — I10 Essential (primary) hypertension: Secondary | ICD-10-CM | POA: Diagnosis not present

## 2020-10-10 DIAGNOSIS — E782 Mixed hyperlipidemia: Secondary | ICD-10-CM

## 2020-10-10 DIAGNOSIS — E7800 Pure hypercholesterolemia, unspecified: Secondary | ICD-10-CM

## 2020-10-10 LAB — LIPID PANEL
Cholesterol: 145 mg/dL (ref 0–200)
HDL: 71.4 mg/dL (ref >39.00)
LDL Cholesterol: 57 mg/dL (ref 0–99)
NonHDL: 73.23
Total CHOL/HDL Ratio: 2
Triglycerides: 82 mg/dL (ref 0.0–149.0)
VLDL: 16.4 mg/dL (ref 0.0–40.0)

## 2020-10-10 LAB — URINALYSIS, ROUTINE W REFLEX MICROSCOPIC
Bilirubin Urine: NEGATIVE
Hgb urine dipstick: NEGATIVE
Leukocytes,Ua: NEGATIVE
Nitrite: NEGATIVE
Specific Gravity, Urine: 1.025 (ref 1.000–1.030)
Total Protein, Urine: NEGATIVE
Urine Glucose: NEGATIVE
Urobilinogen, UA: 0.2 (ref 0.0–1.0)
pH: 5.5 (ref 5.0–8.0)

## 2020-10-10 LAB — COMPREHENSIVE METABOLIC PANEL
ALT: 16 U/L (ref 0–53)
AST: 20 U/L (ref 0–37)
Albumin: 3.8 g/dL (ref 3.5–5.2)
Alkaline Phosphatase: 80 U/L (ref 39–117)
BUN: 17 mg/dL (ref 6–23)
CO2: 29 mEq/L (ref 19–32)
Calcium: 8.8 mg/dL (ref 8.4–10.5)
Chloride: 104 mEq/L (ref 96–112)
Creatinine, Ser: 0.88 mg/dL (ref 0.40–1.50)
GFR: 84.05 mL/min (ref >60.00)
Glucose, Bld: 92 mg/dL (ref 70–99)
Potassium: 4.5 mEq/L (ref 3.5–5.1)
Sodium: 140 mEq/L (ref 135–145)
Total Bilirubin: 0.5 mg/dL (ref 0.2–1.2)
Total Protein: 6.5 g/dL (ref 6.0–8.3)

## 2020-10-10 LAB — CBC
HCT: 39.5 % (ref 39.0–52.0)
Hemoglobin: 13.4 g/dL (ref 13.0–17.0)
MCHC: 33.9 g/dL (ref 30.0–36.0)
MCV: 97.1 fl (ref 78.0–100.0)
Platelets: 170 10*3/uL (ref 150.0–400.0)
RBC: 4.07 Mil/uL — ABNORMAL LOW (ref 4.22–5.81)
RDW: 14.4 % (ref 11.5–15.5)
WBC: 6.2 10*3/uL (ref 4.0–10.5)

## 2020-10-10 NOTE — Progress Notes (Signed)
Per orders of Dr. Ethelene Hal pt is here for labs pt tolerated draw well.

## 2020-10-16 ENCOUNTER — Ambulatory Visit: Payer: Medicare HMO | Admitting: Family Medicine

## 2020-10-16 ENCOUNTER — Other Ambulatory Visit: Payer: Self-pay

## 2020-10-16 ENCOUNTER — Ambulatory Visit (INDEPENDENT_AMBULATORY_CARE_PROVIDER_SITE_OTHER): Payer: Medicare HMO

## 2020-10-16 DIAGNOSIS — H6123 Impacted cerumen, bilateral: Secondary | ICD-10-CM | POA: Diagnosis not present

## 2020-10-16 NOTE — Progress Notes (Addendum)
Patient has been using Debrox and advised to come in for a repeat/follow up ear lavage. This nurse flushed both ears with was able to clear some cerumen out of the ear canal. Patient tolerated the treatment with minimal discomfort. Patient will continue to use the Debrox as needed.  Noted.

## 2020-10-17 ENCOUNTER — Other Ambulatory Visit: Payer: Self-pay

## 2020-10-17 DIAGNOSIS — I739 Peripheral vascular disease, unspecified: Secondary | ICD-10-CM

## 2020-10-23 ENCOUNTER — Telehealth: Payer: Self-pay

## 2020-10-23 NOTE — Progress Notes (Signed)
Chronic Care Management Pharmacy Assistant   Name: Roger Stewart  MRN: [id number] DOB: 09-10-1945  Reason for [place] Call.   Recent office visits:  10/09/2020 Dr Ethelene Hal MD (PCP) - restart Cilostazol 100 mg twice daily , restart Debrox 6.5%  Recent consult visits:  No recent [facility] visits:  None in previous 6 months  Medications: Outpatient Encounter Medications as of 10/23/2020  Medication Sig Note  . atorvastatin (LIPITOR) 20 MG tablet TAKE 1 TABLET BY MOUTH EVERY DAY   . carbamide peroxide (DEBROX) 6.5 % OTIC solution Place 5 drops in each ear and plug with cotton for 1 hour and then remove. (Patient not taking: Reported on 10/09/2020)   . cilostazol (PLETAL) 100 MG tablet TAKE 1 TABLET BY MOUTH TWICE A DAY   . diclofenac (VOLTAREN) 25 MG EC tablet Take 1 tablet (25 mg total) by mouth 2 (two) times daily as needed. (Patient not taking: Reported on 10/09/2020) 11/29/2019: PRN  . lisinopril (ZESTRIL) 20 MG tablet TAKE 1 TABLET BY MOUTH EVERY DAY** Needs appt before anymore refills given.**   . metoprolol tartrate (LOPRESSOR) 25 MG tablet TAKE 1 TABLET BY MOUTH EVERY DAY    No facility-administered encounter medications on file as of 10/23/2020.   Star Rating Drugs: Atorvastatin 20 mg last filled on 09/09/2020 for [facility] day supply [facility]. Lisinopril 20 mg last filled on 09/01/2020 [facility] day [facility].   Reviewed chart prior to disease state call. Spoke with patient regarding BP  Recent Office Vitals: BP Readings from Last 3 Encounters:  10/09/20 102/64  03/22/20 100/64  11/29/19 112/74   Pulse Readings from Last 3 Encounters:  10/09/20 88  03/22/20 84  11/29/19 87    Wt Readings from Last 3 Encounters:  10/09/20 206 lb 9.6 oz (93.7 kg)  03/22/20 209 lb 9.6 oz (95.1 kg)  12/29/19 200 lb (90.7 kg)     Kidney Function Lab Results  Component Value Date/Time   CREATININE 0.88 10/10/2020 08:57 AM    CREATININE 0.81 11/29/2019 09:43 AM   GFR 84.05 10/10/2020 08:57 AM   GFRNONAA 90.35 12/20/2008 12:00 AM   GFRAA 110 12/24/2007 12:00 AM    BMP Latest Ref Rng & Units 10/10/2020 11/29/2019 05/31/2019  Glucose 70 - 99 mg/dL 92 115(H) 117(H)  BUN 6 - 23 mg/dL 17 18 17   Creatinine 0.40 - 1.50 mg/dL 0.88 0.81 0.82  Sodium 135 - 145 mEq/L 140 141 138  Potassium 3.5 - 5.1 mEq/L 4.5 4.0 4.5  Chloride 96 - 112 mEq/L 104 105 103  CO2 19 - 32 mEq/L 29 29 27   Calcium 8.4 - 10.5 mg/dL 8.8 8.6 9.4    . Current antihypertensive regimen:  ? Lisinopril 20 MG Daily ? Metoprolol 25 MG Daily o  . How often are you checking your Blood Pressure? weekly . Current home BP readings:  ? Patient states his blood pressure ranges around 120's/80'. ? Patient states his dizziness has improved some .  Marland Kitchen What recent interventions/DTPs have been made by any provider to improve Blood Pressure control since last CPP Visit: None ID . Any recent hospitalizations or ED visits since last visit with CPP? No . What diet changes have been made to improve Blood Pressure Control?  o None ID . What exercise is being done to improve your Blood Pressure Control?  ? Patient states he does not exercise as much as he wants due to a blood clot in his leg.  Adherence Review: Is the patient currently on ACE/ARB medication? Yes Does the patient have >5 day gap between last estimated fill dates? No  Schedule a telephone appointment with clinical pharmacist on 11/27/2020 at 11:00 am for a follow up.Sent message to scheduler  to schedule appointment.  [place] Pharmacist Assistant [phone]

## 2020-11-14 ENCOUNTER — Encounter: Payer: Self-pay | Admitting: Vascular Surgery

## 2020-11-14 ENCOUNTER — Ambulatory Visit (HOSPITAL_COMMUNITY)
Admission: RE | Admit: 2020-11-14 | Discharge: 2020-11-14 | Disposition: A | Discharge status: Home / Self Care | Payer: Medicare HMO | Source: Ambulatory Visit | Attending: Vascular Surgery | Admitting: Vascular Surgery

## 2020-11-14 ENCOUNTER — Ambulatory Visit: Payer: Medicare HMO | Admitting: Vascular Surgery

## 2020-11-14 ENCOUNTER — Other Ambulatory Visit: Payer: Self-pay

## 2020-11-14 VITALS — BP 102/65 | HR 72 | Temp 97.8°F | Resp 18 | Ht 72.0 in | Wt 203.0 lb

## 2020-11-14 DIAGNOSIS — I739 Peripheral vascular disease, unspecified: Secondary | ICD-10-CM

## 2020-11-14 NOTE — Progress Notes (Signed)
Patient name: Roger Stewart MRN: [id number] DOB: [birth year] Sex: male  REASON FOR CONSULT: 31-month follow-up for right lower extremity claudication  HPI: Roger Stewart is a 75 y.o. male, with history of hypertension and hyperlipidemia that presents for 49-month follow-up of claudication in his right leg.  He was initially seen with burning in the right calf after walking about 5 to 10 minutes.  He spends about half his year in [place].  On follow-up today feels his right leg is doing about the same.  No rest pain at night.  No tissue loss.  He is smoking cigars.  States ultimately is able to play a complete round of golf.  Still has cramping in the calf particularly with pickleball and is unable to play pickleball.  Past Medical History:  Diagnosis Date  . Cancer ([place])    Basal Cell X 8; Dr [place] Crane  . Colon polyps    Dr Amedeo Plenty  . Diverticulosis of colon   . Hyperlipidemia    elevated TG  . Hypertension   . Sciatica     Past Surgical History:  Procedure Laterality Date  . ABDOMINAL AORTIC ANEURYSM REPAIR  2005  . colonoscopy with polypectomy  2010;2016   Dr Amedeo Plenty  . ESI X3     LS spine  . LUMBAR SPINE SURGERY  04/2010   spur & bulging disc L 4-5, [place] , [place]  08/2010   cyst resected @ op site    Family History  Problem Relation Age of Onset  . Heart disease Mother        CHF ,CABG  . Prostate cancer Father   . Hypertension Father   . Transient ischemic attack Father        in 15s  . Hypertension Brother   . Acromegaly Brother   . Prostate cancer Brother   . Leukemia Brother   . Diabetes Neg Hx     SOCIAL HISTORY: Social History   Socioeconomic History  . Marital status: Legally Separated    Spouse name: Not on file  . Number of children: 1  . Years of education: 46  . Highest education level: Not on file  Occupational History  . Not on file  Tobacco Use  . Smoking status: Current Every Day Smoker    Types: Cigars    Last attempt to  quit: 06/10/2000    Years since quitting: 20.4  . Smokeless tobacco: Never Used  . Tobacco comment:  Smoked [phone], up to 3/4 ppd. 05/22/15 1 cigar a day. 10/26/19 2-4 cigars daily  Vaping Use  . Vaping Use: Never used  Substance and Sexual Activity  . Alcohol use: Yes    Alcohol/week: 16.0 standard drinks    Types: 2 Glasses of wine, 14 Cans of beer per week  . Drug use: No  . Sexual activity: Not on file  Other Topics Concern  . Not on file  Social History Narrative   Denies abuse and feels safe at home.    Social Determinants of Health   Financial Resource Strain: Low Risk   . Difficulty of Paying Living Expenses: Not hard at all  Food Insecurity: No Food Insecurity  . Worried About Charity fundraiser in the Last Year: Never true  . Ran Out of Food in the Last Year: Never true  Transportation Needs: No Transportation Needs  . Lack of Transportation (Medical): No  . Lack of Transportation (Non-Medical): No  Physical Activity:  Insufficiently Active  . Days of Exercise per Week: 4 days  . Minutes of Exercise per Session: 30 min  Stress: No Stress Concern Present  . Feeling of Stress : Not at all  Social Connections: Moderately Isolated  . Frequency of Communication with Friends and Family: More than three times a week  . Frequency of Social Gatherings with Friends and Family: More than three times a week  . Attends Religious Services: Never  . Active Member of Clubs or Organizations: Yes  . Attends Archivist Meetings: More than 4 times per year  . Marital Status: Separated  Intimate Partner Violence: Not At Risk  . Fear of Current or Ex-Partner: No  . Emotionally Abused: No  . Physically Abused: No  . Sexually Abused: No    Allergies  Allergen Reactions  . Norvasc [Amlodipine Besylate] Other (See Comments)    Body aches    Current Outpatient Medications  Medication Sig Dispense Refill  . atorvastatin (LIPITOR) 20 MG tablet TAKE 1 TABLET BY MOUTH EVERY  DAY 90 tablet 1  . lisinopril (ZESTRIL) 20 MG tablet TAKE 1 TABLET BY MOUTH EVERY DAY** Needs appt before anymore refills given.** 90 tablet 0  . metoprolol tartrate (LOPRESSOR) 25 MG tablet TAKE 1 TABLET BY MOUTH EVERY DAY 90 tablet 3  . carbamide peroxide (DEBROX) 6.5 % OTIC solution Place 5 drops in each ear and plug with cotton for 1 hour and then remove. (Patient not taking: No sig reported) 15 mL 0  . cilostazol (PLETAL) 100 MG tablet TAKE 1 TABLET BY MOUTH TWICE A DAY 180 tablet 0  . diclofenac (VOLTAREN) 25 MG EC tablet Take 1 tablet (25 mg total) by mouth 2 (two) times daily as needed. (Patient not taking: No sig reported) 180 tablet 0   No current facility-administered medications for this visit.    REVIEW OF SYSTEMS:  [X]  denotes positive finding, [ ]  denotes negative finding Cardiac  Comments:  Chest pain or chest pressure:    Shortness of breath upon exertion:    Short of breath when lying flat:    Irregular heart rhythm:        Vascular    Pain in calf, thigh, or hip brought on by ambulation: x Right  Pain in feet at night that wakes you up from your sleep:     Blood clot in your veins:    Leg swelling:         Pulmonary    Oxygen at home:    Productive cough:     Wheezing:         Neurologic    Sudden weakness in arms or legs:     Sudden numbness in arms or legs:     Sudden onset of difficulty speaking or slurred speech:    Temporary loss of vision in one eye:     Problems with dizziness:         Gastrointestinal    Blood in stool:     Vomited blood:         Genitourinary    Burning when urinating:     Blood in urine:        Psychiatric    Major depression:         Hematologic    Bleeding problems:    Problems with blood clotting too easily:        Skin    Rashes or ulcers:        Constitutional  Fever or chills:      PHYSICAL EXAM: Vitals:   11/14/20 1141  BP: 102/65  Pulse: 72  Resp: 18  Temp: 97.8 F (36.6 C)  TempSrc: Temporal   SpO2: 92%  Weight: 203 lb (92.1 kg)  Height: 6' (1.829 m)    GENERAL: The patient is a well-nourished male, in no acute distress. The vital signs are documented above. CARDIAC: There is a regular rate and rhythm.  VASCULAR:  2+ palpable femoral pulse bilaterally Left dorsalis pedis and posterior tibial pulse palpable No palpable right pedal pulses PULMONARY: No respiratory  ABDOMEN: Soft and non-tender with normal pitched bowel sounds.  MUSCULOSKELETAL: There are no major deformities or cyanosis. NEUROLOGIC: No focal weakness or paresthesias are detected. SKIN: There are no ulcers or rashes noted. PSYCHIATRIC: The patient has a normal affect.  DATA:   ABIs are 0.80 on the right monophasic and 1.16 on the left triphasic  Assessment/Plan:  75 year old male presents for follow-up of his right lower extremity claudication.   Appears his symptoms are stable over the last year.  We discussed options of intervention and I again discussed with claudication we try and manage very conservatively.  Again discussed claudication is not a limb threatening situation.  Ultimately given stability of symptoms we will see him again in 1 year.  I gave him my card and discussed that he call me if he has any worsening symptoms before follow-up and I'd be happy to offer him an arteriogram with intervention.    I think when his claudication becomes more disabling and he is unable to do many of his activities that should be when we should consider intervention.   Marty Heck, MD [facility] Office: [place]

## 2020-11-16 ENCOUNTER — Telehealth: Payer: Medicare HMO | Admitting: Physician Assistant

## 2020-11-16 DIAGNOSIS — M109 Gout, unspecified: Secondary | ICD-10-CM | POA: Diagnosis not present

## 2020-11-16 MED ORDER — PREDNISONE 20 MG PO TABS: 40.0000 mg | ORAL_TABLET | Freq: Every day | ORAL | 0 refills | Status: DC

## 2020-11-16 NOTE — Progress Notes (Signed)
I have spent 5 minutes in review of e-visit questionnaire, review and updating patient chart, medical decision making and response to patient.   Freada Twersky Cody Boomer Winders, PA-C    

## 2020-11-16 NOTE — Progress Notes (Signed)
E-Visit for Gout  We are sorry that you are not feeling well. We are here to help!  Based on what you shared with me it looks like you have a flare of your gout.  Gout is a form of arthritis. It can cause pain and swelling in the joints. At first, it tends to affect only 1 joint - most frequently the big toe. It happens in people who have too much uric acid in the blood. Uric acid is a chemical that is produced when the body breaks down certain foods. Uric acid can form sharp needle-like crystals that build up in the joints and cause pain. Uric acid crystals can also form inside the tubes that carry urine from the kidneys to the bladder. These crystals can turn into "kidney stones" that can cause pain and problems with the flow of urine. People with gout get sudden "flares" or attacks of severe pain, most often the big toe, ankle, or knee. Often the joint also turns red and swells. Usually, only 1 joint is affected, but some people have pain in more than 1 joint. Gout flares tend to happen more often during the night.  The pain from gout can be extreme. The pain and swelling are worst at the beginning of a gout flare. The symptoms then get better within a few days to weeks. It is not clear how the body "turns off" a gout flare.  Do not start any NEW preventative medicine until the gout has cleared completely. However, If you are already on Probenecid or Allopurinol for CHRONIC gout, you may continue taking this during an active flare up. You will need to reach out to your primary care to be restarted on allopurinol.   For gout flare I have prescribed Prednisone 40 mg daily for 5 days   HOME CARE Losing weight can help relieve gout. It's not clear that following a specific diet plan will help with gout symptoms but eating a balanced diet can help improve your overall health. It can also help you lose weight, if you are overweight. In general, a healthy diet includes plenty of fruits, vegetables, whole  grains, and low-fat dairy products (labelled "low fat", skim, 2%). Avoid sugar sweetened drinks (including sodas, tea, juice and juice blends, coffee drinks and sports drinks) Limit alcohol to 1-2 drinks of beer, spirits or wine daily these can make gout flares worse. Some people with gout also have other health problems, such as heart disease, high blood pressure, kidney disease, or obesity. If you have any of these issues, it's important to work with your doctor to manage them. This can help improve your overall health and might also help with your gout.  GET HELP RIGHT AWAY IF: Your symptoms persist after you have completed your treatment plan You develop severe diarrhea You develop abnormal sensations  You develop vomiting,   You develop weakness  You develop abdominal pain  FOLLOW UP WITH YOUR PRIMARY PROVIDER IF: If your symptoms do not improve within 10 days  MAKE SURE YOU  Understand these instructions. Will watch your condition. Will get help right away if you are not doing well or get worse.  Your e-visit answers were reviewed by a board certified advanced clinical practitioner to complete your personal care plan. Depending upon the condition, your plan could have included both over the counter or prescription medications.  Your safety is important to [place]. If you have drug allergies check your prescription carefully.   You can use MyChart  to ask questions about today's visit, request a non-urgent call back, or ask for a work or school excuse for 24 hours related to this e-Visit. If it has been greater than 24 hours you will need to follow up with your provider, or enter a new e-Visit to address those concerns. You will get an e-mail with a link to a survey asking about your experience.  We hope that your e-visit has been valuable and will speed your recovery! Thank you for using e-visits.

## 2020-11-23 ENCOUNTER — Other Ambulatory Visit: Payer: Self-pay | Admitting: Family

## 2020-11-23 DIAGNOSIS — I739 Peripheral vascular disease, unspecified: Secondary | ICD-10-CM

## 2020-11-24 ENCOUNTER — Telehealth: Payer: Self-pay

## 2020-11-24 NOTE — Progress Notes (Signed)
Spoke to patient to confirmed patient telephone appointment on 11/27/2020 for CCM at 11:00 pm with Junius Argyle the Clinical pharmacist.   Patient aware to have all medications, supplements, blood pressure logs to visit.  Questions: Have you had any recent office visit or specialist visit outside of [facility]?No Are there any concerns you would like to discuss during your office visit?    Patient states he is concern his lisinopril and metoprolol is to strong for him.Patient reports his blood pressure was 110/75 and 98/62 with symptoms of being dizzy.Patient states he didn't take his blood pressure medication and he felt better.  Inform patient to check his blood pressure over the weekend when he is having symptoms of dizziness and after he takes his metoprolol and lisinopril to wait 30 minutes and then check his blood pressure.Ask patient to keep a log and have it ready at his telephone appointment. Patient Verbalized understanding and agreed.  Star Rating Drug: Atorvastatin 20 mg last filled on 09/09/2020 [facility]. Lisinopril 20 mg last filled on 09/01/2020 [facility].  Any gaps in medications fill history? None ID   [place] Clinical Pharmacist Assistant [phone]

## 2020-11-27 ENCOUNTER — Ambulatory Visit (INDEPENDENT_AMBULATORY_CARE_PROVIDER_SITE_OTHER): Payer: Medicare HMO

## 2020-11-27 DIAGNOSIS — E782 Mixed hyperlipidemia: Secondary | ICD-10-CM | POA: Diagnosis not present

## 2020-11-27 DIAGNOSIS — Z720 Tobacco use: Secondary | ICD-10-CM

## 2020-11-27 DIAGNOSIS — I10 Essential (primary) hypertension: Secondary | ICD-10-CM | POA: Diagnosis not present

## 2020-11-27 NOTE — Progress Notes (Signed)
Chronic Care Management Pharmacy Note  12/20/2020 Name:  Roger Stewart MRN:  [id number] DOB:  Dec 25, 1945  Summary: Patient presents for follow-up today. He reports he is doing well overall, but feels his blood pressure might be getting too low. He reports symptoms of dizziness, feeling lightheaded and off balance. Increasing in frequency over the past 6 months. This most often occurs when bending over golfing.   Recommendations/Changes made from today's visit: Recommend decreasing metoprolol to 12.5 mg daily   Plan: CPP follow-up in 3 months  Subjective: Roger Stewart is an 75 y.o. year old male who is a primary patient of Libby Maw, MD.  The CCM team was consulted for assistance with disease management and care coordination needs.    Engaged with patient by telephone for follow up visit in response to provider referral for pharmacy case management and/or care coordination services.   Consent to Services:  The patient was given information about Chronic Care Management services, agreed to services, and gave verbal consent prior to initiation of services.  Please see initial visit note for detailed documentation.   Patient Care Team: Libby Maw, MD as PCP - General (Family Medicine) Germaine Pomfret, [facility] as Pharmacist (Pharmacist)  Recent office visits: 10/09/20: Patient presented to Dr. Ethelene Hal for follow-up.   Recent consult visits: 11/16/20: Patient presented to Raiford Noble, PA-C for Acute Gout. Patient presecribed prednisone 40 mg daily  11/14/20: Patient presented to Dr. Carlis Abbott (Vascular). Not a candidate for surgical intervention.   Hospital visits: None in previous 6 months   Objective:  Lab Results  Component Value Date   CREATININE 0.88 10/10/2020   BUN 17 10/10/2020   GFR 84.05 10/10/2020   GFRNONAA 90.35 12/20/2008   GFRAA 110 12/24/2007   NA 140 10/10/2020   K 4.5 10/10/2020   CALCIUM 8.8 10/10/2020   CO2 29 10/10/2020   GLUCOSE  92 10/10/2020    Lab Results  Component Value Date/Time   HGBA1C 6.0 05/27/2018 10:53 AM   HGBA1C 5.8 05/26/2017 10:46 AM   GFR 84.05 10/10/2020 08:57 AM   GFR 92.96 11/29/2019 09:43 AM   MICROALBUR 0.2 09/10/2006 09:07 AM    Last diabetic Eye exam: No results found for: HMDIABEYEEXA  Last diabetic Foot exam: No results found for: HMDIABFOOTEX   Lab Results  Component Value Date   CHOL 145 10/10/2020   HDL 71.40 10/10/2020   LDLCALC 57 10/10/2020   LDLDIRECT 52.0 11/29/2019   TRIG 82.0 10/10/2020   CHOLHDL 2 10/10/2020    Hepatic Function Latest Ref Rng & Units 10/10/2020 11/29/2019 05/31/2019  Total Protein 6.0 - 8.3 g/dL 6.5 5.9(L) 6.4  Albumin 3.5 - 5.2 g/dL 3.8 3.5 3.8  AST 0 - 37 U/L _0 ALT 0 - 53 U/L _1 Alk Phosphatase 39 - 117 U/L 80 68 81  Total Bilirubin 0.2 - 1.2 mg/dL 0.5 0.7 0.6  Bilirubin, Direct 0.0 - 0.3 mg/dL - - -    Lab Results  Component Value Date/Time   TSH 1.86 05/27/2018 10:53 AM   TSH 1.34 05/13/2017 10:59 AM    CBC Latest Ref Rng & Units 10/10/2020 03/22/2020 11/29/2019  WBC 4.0 - 10.5 K/uL 6.2 7.1 7.2  Hemoglobin 13.0 - 17.0 g/dL 13.4 12.9(L) 12.5(L)  Hematocrit 39.0 - 52.0 % 39.5 37.7(L) 36.9(L)  Platelets 150.0 - 400.0 K/uL 170.0 200.0 200.0    No results found for: VD25OH  Clinical ASCVD: Yes  The 10-year ASCVD  risk score Mikey Bussing DC Jr., et al., 2013) is: 17.4%   Values used to calculate the score:     Age: 41 years     Sex: Male     Is Non-Hispanic African American: No     Diabetic: No     Tobacco smoker: Yes     Systolic Blood Pressure: 956 mmHg     Is BP treated: Yes     HDL Cholesterol: 71.4 mg/dL     Total Cholesterol: 145 mg/dL    Depression screen [facility] 2/9 10/09/2020 10/09/2020 12/29/2019  Decreased Interest 0 0 0  Down, Depressed, Hopeless 1 0 0  PHQ - 2 Score 1 0 0  Altered sleeping 0 - -  Tired, decreased energy 0 - -  Change in appetite 0 - -  Feeling bad or failure about yourself  0 - -  Trouble  concentrating 0 - -  Moving slowly or fidgety/restless 0 - -  Suicidal thoughts 0 - -  PHQ-9 Score 1 - -  Difficult doing work/chores Not difficult at all - -    Social History   Tobacco Use  Smoking Status Every Day   Pack years: 0.00   Types: Cigars   Last attempt to quit: 06/10/2000   Years since quitting: 20.5  Smokeless Tobacco Never  Tobacco Comments    Smoked [phone], up to 3/4 ppd. 05/22/15 1 cigar a day. 10/26/19 2-4 cigars daily   BP Readings from Last 3 Encounters:  11/14/20 102/65  10/09/20 102/64  03/22/20 100/64   Pulse Readings from Last 3 Encounters:  11/14/20 72  10/09/20 88  03/22/20 84   Wt Readings from Last 3 Encounters:  11/14/20 203 lb (92.1 kg)  10/09/20 206 lb 9.6 oz (93.7 kg)  03/22/20 209 lb 9.6 oz (95.1 kg)   BMI Readings from Last 3 Encounters:  11/14/20 27.53 kg/m  10/09/20 28.02 kg/m  03/22/20 28.43 kg/m    Assessment/Interventions: Review of patient past medical history, allergies, medications, health status, including review of consultants reports, laboratory and other test data, was performed as part of comprehensive evaluation and provision of chronic care management services.   SDOH:  (Social Determinants of Health) assessments and interventions performed: Yes SDOH Interventions    Flowsheet Row Most Recent Value  SDOH Interventions   Financial Strain Interventions Intervention Not Indicated      SDOH Screenings   Alcohol Screen: Low Risk    Last Alcohol Screening Score (AUDIT): 2  Depression (PHQ2-9): Low Risk    PHQ-2 Score: 1  Financial Resource Strain: Low Risk    Difficulty of Paying Living Expenses: Not hard at all  Food Insecurity: No Food Insecurity   Worried About Charity fundraiser in the Last Year: Never true   Ran Out of Food in the Last Year: Never true  Housing: Low Risk    Last Housing Risk Score: 0  Physical Activity: Insufficiently Active   Days of Exercise per Week: 4 days   Minutes of Exercise  per Session: 30 min  Social Connections: Moderately Isolated   Frequency of Communication with Friends and Family: More than three times a week   Frequency of Social Gatherings with Friends and Family: More than three times a week   Attends Religious Services: Never   Marine scientist or Organizations: Yes   Attends Music therapist: More than 4 times per year   Marital Status: Separated  Stress: No Stress Concern Present   Feeling of  Stress : Not at all  Tobacco Use: High Risk   Smoking Tobacco Use: Every Day   Smokeless Tobacco Use: Never  Transportation Needs: No Transportation Needs   Lack of Transportation (Medical): No   Lack of Transportation (Non-Medical): No    CCM Care Plan  Allergies  Allergen Reactions   Norvasc [Amlodipine Besylate] Other (See Comments)    Body aches    Medications Reviewed Today     Reviewed by Delorse Limber (Physician Assistant Certified) on 65/79/03 at [place] List Status: <None>   Medication Order Taking? Sig Documenting Provider Last Dose Status Informant  atorvastatin (LIPITOR) 20 MG tablet [id number] No TAKE 1 TABLET BY MOUTH EVERY DAY Kennyth Arnold, FNP Taking Active   Patient not taking:  Discontinued 11/16/20 1555   cilostazol (PLETAL) 100 MG tablet [id number] No TAKE 1 TABLET BY MOUTH TWICE A Jennye Moccasin, FNP Taking Active   Patient not taking:  Discontinued 11/16/20 1555            Med Note (BATES, TEQUILA   Mon Nov 29, 2019  9:32 AM) PRN  lisinopril (ZESTRIL) 20 MG tablet [id number] No TAKE 1 TABLET BY MOUTH EVERY DAY** Needs appt before anymore refills given.Libby Maw, MD Taking Active   metoprolol tartrate (LOPRESSOR) 25 MG tablet [id number] No TAKE 1 TABLET BY MOUTH EVERY DAY Kennyth Arnold, FNP Taking Active             Patient Active Problem List   Diagnosis Date Noted   Anemia 03/22/2020   History of gout 11/29/2019   Ascending aorta dilatation ([place]) 06/28/2019    Atherosclerotic vascular disease 06/03/2019   Chest discomfort 06/03/2019   H/O aortic aneurysm repair 06/03/2019   PVD (peripheral vascular disease) ([place]) 03/18/2019   Intermittent claudication ([place]) 01/01/2019   Elevated LDL cholesterol level 05/29/2018   Benign localized prostatic hyperplasia with lower urinary tract symptoms (LUTS) 05/26/2017   Elevated blood sugar 05/26/2017   Excessive cerumen in both ear canals 05/26/2017   Tobacco use 05/26/2017   Encounter for screening for lung cancer 05/26/2017   Sciatica of right side 05/12/2017   Acute bronchitis with COPD ([place]) 05/12/2017   Screening for hyperlipidemia 05/23/2016   Other abnormal glucose 05/22/2012   Nonspecific abnormal electrocardiogram (ECG) (EKG) 05/22/2012   Family history of prostate cancer 05/22/2012   INGUINAL HERNIA, LEFT, SMALL 05/10/2010   SPINAL STENOSIS, LUMBAR 04/26/2009   Lumbar radiculopathy 04/26/2009   DIVERTICULOSIS, COLON 12/20/2008   History of colonic polyps 12/20/2008   Mixed hyperlipidemia 08/06/2007   Essential hypertension 08/06/2007   SKIN CANCER, HX OF 08/06/2007    Immunization History  Administered Date(s) Administered   Fluad Quad(high Dose 65+) 03/18/2019, 03/22/2020   Influenza, High Dose Seasonal PF 05/26/2017   Moderna Sars-Covid-2 Vaccination 08/09/2019, 09/06/2019   Td 06/11/1999, 05/10/2010   Zoster Recombinat (Shingrix) 02/09/2019    Conditions to be addressed/monitored:  Hypertension, Hyperlipidemia, Tobacco use, Gout, and Peripheral Vascular Disease  Care Plan : General Pharmacy (Adult)  Updates made by Germaine Pomfret, RPH since 12/20/2020 12:00 AM     Problem: Hypertension, Hyperlipidemia, Tobacco use, Gout, and Peripheral Vascular Disease   Priority: High     Long-Range Goal: Patient-Specific Goal   Start Date: 11/27/2020  Expected End Date: 12/20/2021  This Visit's Progress: On track  Priority: High  Note:   Current Barriers:  No barriers  noted  Pharmacist Clinical Goal(s):  Patient will maintain control of blood  pressure as evidenced by BP less than 140/90  through collaboration with PharmD and provider.   Interventions: 1:1 collaboration with Libby Maw, MD regarding development and update of comprehensive plan of care as evidenced by provider attestation and co-signature Inter-disciplinary care team collaboration (see longitudinal plan of care) Comprehensive medication review performed; medication list updated in electronic medical record  Hypertension (BP goal <140/90) -Not ideally controlled -Current treatment: Lisinopril 20 mg daily  Metoprolol tartrate 25 mg daily  -Medications previously tried: NA  -Current home readings: 111/64, 117/77, 98/70, 109/74, 127/75, 115/68, 124/88, 99/68   -Reports hypotensive/ hypertensive symptoms: dizziness, lightheaded, off balance. Increasing in past 6 month. When bending over golfing.  -Educated on Symptoms of hypotension and importance of maintaining adequate hydration; -Drinking 4-5 glasses of water daily  -Counseled to monitor BP at home 2-3 times weekly, document, and provide log at future appointments -Recommended DECREASING metoprolol to 12.5 mg daily   Hyperlipidemia: (LDL goal < 100) -Controlled -Current treatment: Atorvastatin 20 mg daily  Cilostazol 100 mg twice daily  -Medications previously tried: NA  -1 to 2 cigars daily  -Educated on importance of smoking cessation, patient not interested in quitting at this time. -Recommended to continue current medication  Gout (Goal: Prevent gout flares) -Controlled Flare: June and MY, September 2021  -Current treatment  None -Medications previously tried: Allopurinol  -Counseled patient on low purine diet plan. Counseled patient to reduce consumption of high-fructose corn syrup, sweetened soft drinks, fruit juices, meat, and seafood. -Counseled patient to avoid alcohol consumption.  -Recommended to continue  current medication  Patient Goals/Self-Care Activities Patient will:  - check blood pressure 2-3 times weekly, document, and provide at future appointments  Follow Up Plan: Telephone follow up appointment with care management team member scheduled for:  03/05/2021 at 9:00 AM      Medication Assistance: None required.  Patient affirms current coverage meets needs.  Compliance/Adherence/Medication fill history: Care Gaps: PCV13 Shingrix Tdap  Star-Rating Drugs: Atorvastatin 20 mg last filled on 09/09/2020 [facility]. Lisinopril 20 mg last filled on 09/01/2020 [facility].  Patient's preferred pharmacy is:  [facility] [place] [address] [place] [address] Phone: [phone] Fax: [phone]  [id number] [place] [address]Phone: [phone]: [phone] [id number] [address] [place]: [phone]: [id number] [place]one: [phone]: [phone] [id number] [place] [place]one: [phone]: [phone] pill box? Yes Pt endorses 100% compliance  We discussed: Current pharmacy is preferred with insurance plan and patient is satisfied with pharmacy services Patient decided to: Continue current medication management strategy  Care Plan and Follow Up Patient Decision:  Patient agrees to Care Plan and Follow-up.  Plan: Telephone follow up appointment with care management team member scheduled for:  03/05/2021 at 9:00 AM  AleJunius ArgyleharmD, CPP Clinical  Pharmacist [phone]

## 2020-12-02 ENCOUNTER — Other Ambulatory Visit: Payer: Self-pay | Admitting: Family Medicine

## 2020-12-06 ENCOUNTER — Other Ambulatory Visit: Payer: Self-pay | Admitting: Family

## 2020-12-06 DIAGNOSIS — E7800 Pure hypercholesterolemia, unspecified: Secondary | ICD-10-CM

## 2020-12-12 ENCOUNTER — Other Ambulatory Visit: Payer: Self-pay | Admitting: Family Medicine

## 2020-12-12 DIAGNOSIS — I739 Peripheral vascular disease, unspecified: Secondary | ICD-10-CM

## 2020-12-12 MED ORDER — CILOSTAZOL 100 MG PO TABS: 100.0000 mg | ORAL_TABLET | Freq: Two times a day (BID) | ORAL | 1 refills | Status: DC

## 2020-12-12 NOTE — Telephone Encounter (Signed)
Refill request for pending Rx last OV 10/09/20 last refill 07/27/2020. Please advise.

## 2020-12-12 NOTE — Telephone Encounter (Signed)
Done

## 2020-12-12 NOTE — Telephone Encounter (Signed)
What is the name of the medication? cilostazol (PLETAL) 100 MG tablet [[id number]]   Have you contacted your pharmacy to request a refill? Yes, he is needing a refill.   Which pharmacy would you like this sent to? Pharmacy  [id number] [place], [place]  [address], [place] [address]  Phone:  [phone]  Fax:  [phone]  DEA #:  [id number]    Patient notified that their request is being sent to the clinical staff for review and that they should receive a call once it is complete. If they do not receive a call within 72 hours they can check with their pharmacy or our office.

## 2020-12-20 NOTE — Patient Instructions (Signed)
Visit Information It was great speaking with you today!  Please let me know if you have any questions about our visit.   Goals Addressed             This Visit's Progress    Track and Manage My Blood Pressure-Hypertension       Timeframe:  Long-Range Goal Priority:  High Start Date: 11/27/2020                             Expected End Date: 11/27/2021                      Follow Up Date 03/05/2021    - check blood pressure 3 times per week    Why is this important?   You won't feel high blood pressure, but it can still hurt your blood vessels.  High blood pressure can cause heart or kidney problems. It can also cause a stroke.  Making lifestyle changes like losing a little weight or eating less salt will help.  Checking your blood pressure at home and at different times of the day can help to control blood pressure.  If the doctor prescribes medicine remember to take it the way the doctor ordered.  Call the office if you cannot afford the medicine or if there are questions about it.     Notes:          Patient Care Plan: General Pharmacy (Adult)     Problem Identified: Hypertension, Hyperlipidemia, Tobacco use, Gout, and Peripheral Vascular Disease   Priority: High     Long-Range Goal: Patient-Specific Goal   Start Date: 11/27/2020  Expected End Date: 12/20/2021  This Visit's Progress: On track  Priority: High  Note:   Current Barriers:  No barriers noted  Pharmacist Clinical Goal(s):  Patient will maintain control of blood pressure as evidenced by BP less than 140/90  through collaboration with PharmD and provider.   Interventions: 1:1 collaboration with Libby Maw, MD regarding development and update of comprehensive plan of care as evidenced by provider attestation and co-signature Inter-disciplinary care team collaboration (see longitudinal plan of care) Comprehensive medication review performed; medication list updated in electronic medical  record  Hypertension (BP goal <140/90) -Not ideally controlled -Current treatment: Lisinopril 20 mg daily  Metoprolol tartrate 25 mg daily  -Medications previously tried: NA  -Current home readings: 111/64, 117/77, 98/70, 109/74, 127/75, 115/68, 124/88, 99/68   -Reports hypotensive/ hypertensive symptoms: dizziness, lightheaded, off balance. Increasing in past 6 month. When bending over golfing.  -Educated on Symptoms of hypotension and importance of maintaining adequate hydration; -Drinking 4-5 glasses of water daily  -Counseled to monitor BP at home 2-3 times weekly, document, and provide log at future appointments -Recommended DECREASING metoprolol to 12.5 mg daily   Hyperlipidemia: (LDL goal < 100) -Controlled -Current treatment: Atorvastatin 20 mg daily  Cilostazol 100 mg twice daily  -Medications previously tried: NA  -1 to 2 cigars daily  -Educated on importance of smoking cessation, patient not interested in quitting at this time. -Recommended to continue current medication  Gout (Goal: Prevent gout flares) -Controlled Flare: June and MY, September 2021  -Current treatment  None -Medications previously tried: Allopurinol  -Counseled patient on low purine diet plan. Counseled patient to reduce consumption of high-fructose corn syrup, sweetened soft drinks, fruit juices, meat, and seafood. -Counseled patient to avoid alcohol consumption.  -Recommended to continue current medication  Patient Goals/Self-Care  Activities Patient will:  - check blood pressure 2-3 times weekly, document, and provide at future appointments  Follow Up Plan: Telephone follow up appointment with care management team member scheduled for:  03/05/2021 at 9:00 AM      Patient agreed to services and verbal consent obtained.   The patient verbalized understanding of instructions, educational materials, and care plan provided today and declined offer to receive copy of patient instructions,  educational materials, and care plan.   Junius Argyle, PharmD, CPP Clinical Pharmacist [facility] at [facility]  [phone]

## 2020-12-29 ENCOUNTER — Telehealth: Payer: Self-pay | Admitting: Family Medicine

## 2020-12-29 NOTE — Telephone Encounter (Signed)
Pt said his gout is flaring up and wanted to know if allopurinol can be sent in, I did advise him he might need an appt but [place] know if it could just be sent in

## 2021-01-03 NOTE — Telephone Encounter (Signed)
Spoke with patient who states [place] in for him.

## 2021-01-17 ENCOUNTER — Telehealth: Payer: Self-pay

## 2021-01-17 NOTE — Progress Notes (Signed)
Chronic Care Management Pharmacy Assistant   Name: HOWE CHARLESWORTH  MRN: [id number] DOB: [birth year]  Reason for [place] Call.   Recent office visits:  No recent Office Visits  Recent consult visits:  No recent [facility] visits:  None in previous 6 months  Medications: Outpatient Encounter Medications as of 01/17/2021  Medication Sig   atorvastatin (LIPITOR) 20 MG tablet TAKE 1 TABLET BY MOUTH EVERY DAY   cilostazol (PLETAL) 100 MG tablet Take 1 tablet (100 mg total) by mouth 2 (two) times daily.   lisinopril (ZESTRIL) 20 MG tablet TAKE 1 TABLET BY MOUTH EVERY DAY** NEEDS APPT BEFORE ANYMORE REFILLS GIVEN.**   metoprolol tartrate (LOPRESSOR) 25 MG tablet TAKE 1 TABLET BY MOUTH EVERY DAY   No facility-administered encounter medications on file as of 01/17/2021.    Care Gaps: PNA Vaccine Shingrix Vaccine Tetanus Vaccine Influenza Vaccine Star Rating Drugs: Atorvastatin 20 mg last filled on 09/09/2020 [facility]. Lisinopril 20 mg last filled on 12/04/2020 [facility]. Medication Fill Gaps: None ID Reviewed chart prior to disease state call. Spoke with patient regarding BP  Recent Office Vitals: BP Readings from Last 3 Encounters:  11/14/20 102/65  10/09/20 102/64  03/22/20 100/64   Pulse Readings from Last 3 Encounters:  11/14/20 72  10/09/20 88  03/22/20 84    Wt Readings from Last 3 Encounters:  11/14/20 203 lb (92.1 kg)  10/09/20 206 lb 9.6 oz (93.7 kg)  03/22/20 209 lb 9.6 oz (95.1 kg)     Kidney Function Lab Results  Component Value Date/Time   CREATININE 0.88 10/10/2020 08:57 AM   CREATININE 0.81 11/29/2019 09:43 AM   GFR 84.05 10/10/2020 08:57 AM   GFRNONAA 90.35 12/20/2008 12:00 AM   GFRAA 110 12/24/2007 12:00 AM    BMP Latest Ref Rng & Units 10/10/2020 11/29/2019 05/31/2019  Glucose 70 - 99 mg/dL 92 115(H) 117(H)  BUN 6 - 23 mg/dL '17 18 17  '$ Creatinine 0.40 -  1.50 mg/dL 0.88 0.81 0.82  Sodium 135 - 145 mEq/L 140 141 138  Potassium 3.5 - 5.1 mEq/L 4.5 4.0 4.5  Chloride 96 - 112 mEq/L 104 105 103  CO2 19 - 32 mEq/L '29 29 27  '$ Calcium 8.4 - 10.5 mg/dL 8.8 8.6 9.4    Current antihypertensive regimen:  Lisinopril 20 mg daily Metoprolol tartrate 25 mg daily  Patient reports lightheadedness when he stands up fast, but only last 5 to 10 minutes. How often are you checking your Blood Pressure? daily Current home BP readings:  Patients states his blood pressure runs around 110-120/70-80.Patient reports he does not have his readings because [place] the beach [place] moment. What recent interventions/DTPs have been made by any provider to improve Blood Pressure control since last CPP Visit: None ID Any recent hospitalizations or ED visits since last visit with CPP? No What diet changes have been made to improve Blood Pressure Control?  Patient states he drinks plenty of water daily. What exercise is being done to improve your Blood Pressure Control?  Patient reports he exercise daily.  Patient states he needs a refill of atorvastatin 20 mg.Patient states he has requested refill about a month ago and the pharmacy never received the prescription.Patient is completely out.Notified Clinical pharmacist.  Adherence Review: Is the patient currently on ACE/ARB medication? Yes Does the patient have >5 day gap between last estimated fill dates? No   Bessie Kellihan,CPA Clinical  Pharmacist Assistant [phone]

## 2021-03-02 ENCOUNTER — Other Ambulatory Visit: Payer: Self-pay | Admitting: Family Medicine

## 2021-03-05 ENCOUNTER — Ambulatory Visit (INDEPENDENT_AMBULATORY_CARE_PROVIDER_SITE_OTHER): Payer: Medicare HMO

## 2021-03-05 DIAGNOSIS — E7800 Pure hypercholesterolemia, unspecified: Secondary | ICD-10-CM

## 2021-03-05 DIAGNOSIS — I10 Essential (primary) hypertension: Secondary | ICD-10-CM

## 2021-03-05 DIAGNOSIS — Z8739 Personal history of other diseases of the musculoskeletal system and connective tissue: Secondary | ICD-10-CM

## 2021-03-05 DIAGNOSIS — Z720 Tobacco use: Secondary | ICD-10-CM

## 2021-03-05 MED ORDER — ATORVASTATIN CALCIUM 20 MG PO TABS: 20.0000 mg | ORAL_TABLET | Freq: Every day | ORAL | 0 refills | Status: DC

## 2021-03-05 NOTE — Patient Instructions (Signed)
Visit Information It was great speaking with you today!  Please let me know if you have any questions about our visit.   Goals Addressed             This Visit's Progress    Track and Manage My Blood Pressure-Hypertension       Timeframe:  Long-Range Goal Priority:  High Start Date: 11/27/2020                             Expected End Date: 11/27/2021                      Follow Up within 90 days    - check blood pressure weekly - write blood pressure results in a log or diary    Why is this important?   You won't feel high blood pressure, but it can still hurt your blood vessels.  High blood pressure can cause heart or kidney problems. It can also cause a stroke.  Making lifestyle changes like losing a little weight or eating less salt will help.  Checking your blood pressure at home and at different times of the day can help to control blood pressure.  If the doctor prescribes medicine remember to take it the way the doctor ordered.  Call the office if you cannot afford the medicine or if there are questions about it.     Notes:         Patient Care Plan: General Pharmacy (Adult)     Problem Identified: Hypertension, Hyperlipidemia, Tobacco use, Gout, and Peripheral Vascular Disease   Priority: High     Long-Range Goal: Patient-Specific Goal   Start Date: 11/27/2020  Expected End Date: 12/20/2021  This Visit's Progress: Not on track  Recent Progress: On track  Priority: High  Note:   Current Barriers:  No barriers noted  Pharmacist Clinical Goal(s):  Patient will maintain control of blood pressure as evidenced by BP less than 140/90  through collaboration with PharmD and provider.   Interventions: 1:1 collaboration with Libby Maw, MD regarding development and update of comprehensive plan of care as evidenced by provider attestation and co-signature Inter-disciplinary care team collaboration (see longitudinal plan of care) Comprehensive medication  review performed; medication list updated in electronic medical record  Hypertension (BP goal <140/90) -Not ideally controlled -Current treatment: Lisinopril 20 mg daily  Metoprolol tartrate 25 mg daily  -Medications previously tried: NA  -Current home readings: 118-122/75-80  -Denies hypotensive/ hypertensive symptoms: Dizziness symptoms have improved.  -Educated on Symptoms of hypotension and importance of maintaining adequate hydration; -Drinking 4-5 glasses of water daily  -Counseled to monitor BP at home 2-3 times weekly, document, and provide log at future appointments  Hyperlipidemia: (LDL goal < 70) -Controlled -Current treatment: Atorvastatin 20 mg daily  -Current Peripheral Vascular Disease treatment: Cilostazol 100 mg twice daily  -Medications previously tried: NA  -Patient reports atorvastatin never refilled, courtesy refill sent in and patient to schedule follow-up with PCP to continue getting refills.  -Recommended starting aspirin 81 mg daily for cardiovascular protection give history of PVD, atherosclerotic vascular disease  Gout (Goal: Prevent gout flares) -Controlled -Last flare: June, July 2022   -Current treatment  Allopurinol 100 mg daily  -Medications previously tried: Allopurinol  -Counseled patient on low purine diet plan. Counseled patient to reduce consumption of high-fructose corn syrup, sweetened soft drinks, fruit juices, meat, and seafood. -Counseled patient to avoid alcohol consumption.  -  Recommended to continue current medication  Tobacco use (Goal Quit smoking) -Uncontrolled -Patient reports smoking 1-2 cigars daily -Previous quit attempts: NA -Patient smokes After 30 minutes of waking -Patient triggers include: Reading a Book, and When Playing Golf -Patient is in a Pre-Contemplative stage of quitting. Counselled extensively on risks of continued cigarette use and benefits of quitting.  -Provided contact information for [place] Quit Line  (1-800-QUIT-NOW) and encouraged patient to reach out to this group for support.  Patient Goals/Self-Care Activities Patient will:  - check blood pressure 2-3 times weekly, document, and provide at future appointments  Follow Up Plan: Telephone follow up appointment with care management team member scheduled for:  09/06/21 at 8:30 AM    Patient agreed to services and verbal consent obtained.   Patient verbalizes understanding of instructions provided today and agrees to view in [place].   Junius Argyle, PharmD, [place], CPP Clinical Pharmacist [facility] at [facility]  [phone]

## 2021-03-05 NOTE — Progress Notes (Signed)
Chronic Care Management Pharmacy Note  03/05/2021 Name:  STEELE Stewart MRN:  [id number] DOB:  [birth year]  Summary: Patient presents for follow-up today. Previously reported dizziness has improved. He was started on allopurinol and indomethacin for a gout flare he had. Patient reports atorvastatin was never refilled, so he stopped taking.   Recommendations/Changes made from today's visit: Recommend starting aspirin 81 mg daily Counseled extensively on smoking cessation  Courtesy refill of atorvastatin sent in Patient to schedule follow-up with PCP to continue getting refills.   Plan: CPP follow-up in 6 months  Subjective: Roger Stewart is an 75 y.o. year old male who is a primary patient of Libby Maw, MD.  The CCM team was consulted for assistance with disease management and care coordination needs.    Engaged with patient by telephone for follow up visit in response to provider referral for pharmacy case management and/or care coordination services.   Consent to Services:  The patient was given information about Chronic Care Management services, agreed to services, and gave verbal consent prior to initiation of services.  Please see initial visit note for detailed documentation.   Patient Care Team: Libby Maw, MD as PCP - General (Family Medicine) Germaine Pomfret, [facility] as Pharmacist (Pharmacist)  Recent office visits: 10/09/20: Patient presented to Dr. Ethelene Hal for follow-up.   Recent consult visits: 11/16/20: Patient presented to Raiford Noble, PA-C for Acute Gout. Patient presecribed prednisone 40 mg daily  11/14/20: Patient presented to Dr. Carlis Abbott (Vascular). Not a candidate for surgical intervention.   Hospital visits: None in previous 6 months   Objective:  Lab Results  Component Value Date   CREATININE 0.88 10/10/2020   BUN 17 10/10/2020   GFR 84.05 10/10/2020   GFRNONAA 90.35 12/20/2008   GFRAA 110 12/24/2007   NA 140 10/10/2020   K  4.5 10/10/2020   CALCIUM 8.8 10/10/2020   CO2 29 10/10/2020   GLUCOSE 92 10/10/2020    Lab Results  Component Value Date/Time   HGBA1C 6.0 05/27/2018 10:53 AM   HGBA1C 5.8 05/26/2017 10:46 AM   GFR 84.05 10/10/2020 08:57 AM   GFR 92.96 11/29/2019 09:43 AM   MICROALBUR 0.2 09/10/2006 09:07 AM    Last diabetic Eye exam: No results found for: HMDIABEYEEXA  Last diabetic Foot exam: No results found for: HMDIABFOOTEX   Lab Results  Component Value Date   CHOL 145 10/10/2020   HDL 71.40 10/10/2020   LDLCALC 57 10/10/2020   LDLDIRECT 52.0 11/29/2019   TRIG 82.0 10/10/2020   CHOLHDL 2 10/10/2020    Hepatic Function Latest Ref Rng & Units 10/10/2020 11/29/2019 05/31/2019  Total Protein 6.0 - 8.3 g/dL 6.5 5.9(L) 6.4  Albumin 3.5 - 5.2 g/dL 3.8 3.5 3.8  AST 0 - 37 U/L 20 16 17   ALT 0 - 53 U/L 16 12 15   Alk Phosphatase 39 - 117 U/L 80 68 81  Total Bilirubin 0.2 - 1.2 mg/dL 0.5 0.7 0.6  Bilirubin, Direct 0.0 - 0.3 mg/dL - - -    Lab Results  Component Value Date/Time   TSH 1.86 05/27/2018 10:53 AM   TSH 1.34 05/13/2017 10:59 AM    CBC Latest Ref Rng & Units 10/10/2020 03/22/2020 11/29/2019  WBC 4.0 - 10.5 K/uL 6.2 7.1 7.2  Hemoglobin 13.0 - 17.0 g/dL 13.4 12.9(L) 12.5(L)  Hematocrit 39.0 - 52.0 % 39.5 37.7(L) 36.9(L)  Platelets 150.0 - 400.0 K/uL 170.0 200.0 200.0    No results found for: VD25OH  Clinical  ASCVD: Yes  The 10-year ASCVD risk score (Arnett DK, et al., 2019) is: 18.4%   Values used to calculate the score:     Age: 75 years     Sex: Male     Is Non-Hispanic African American: No     Diabetic: No     Tobacco smoker: Yes     Systolic Blood Pressure: 950 mmHg     Is BP treated: Yes     HDL Cholesterol: 71.4 mg/dL     Total Cholesterol: 145 mg/dL    Depression screen [facility] 2/9 10/09/2020 10/09/2020 12/29/2019  Decreased Interest 0 0 0  Down, Depressed, Hopeless 1 0 0  PHQ - 2 Score 1 0 0  Altered sleeping 0 - -  Tired, decreased energy 0 - -  Change in appetite 0 -  -  Feeling bad or failure about yourself  0 - -  Trouble concentrating 0 - -  Moving slowly or fidgety/restless 0 - -  Suicidal thoughts 0 - -  PHQ-9 Score 1 - -  Difficult doing work/chores Not difficult at all - -    Social History   Tobacco Use  Smoking Status Every Day   Types: Cigars   Last attempt to quit: 06/10/2000   Years since quitting: 20.7  Smokeless Tobacco Never  Tobacco Comments    Smoked [id number], up to 3/4 ppd. 05/22/15 1 cigar a day. 10/26/19 2-4 cigars daily   BP Readings from Last 3 Encounters:  11/14/20 102/65  10/09/20 102/64  03/22/20 100/64   Pulse Readings from Last 3 Encounters:  11/14/20 72  10/09/20 88  03/22/20 84   Wt Readings from Last 3 Encounters:  11/14/20 203 lb (92.1 kg)  10/09/20 206 lb 9.6 oz (93.7 kg)  03/22/20 209 lb 9.6 oz (95.1 kg)   BMI Readings from Last 3 Encounters:  11/14/20 27.53 kg/m  10/09/20 28.02 kg/m  03/22/20 28.43 kg/m    Assessment/Interventions: Review of patient past medical history, allergies, medications, health status, including review of consultants reports, laboratory and other test data, was performed as part of comprehensive evaluation and provision of chronic care management services.   SDOH:  (Social Determinants of Health) assessments and interventions performed: Yes SDOH Interventions    Flowsheet Row Most Recent Value  SDOH Interventions   Financial Strain Interventions Intervention Not Indicated       SDOH Screenings   Alcohol Screen: Not on file  Depression (PHQ2-9): Low Risk    PHQ-2 Score: 1  Financial Resource Strain: Low Risk    Difficulty of Paying Living Expenses: Not hard at all  Food Insecurity: Not on file  Housing: Not on file  Physical Activity: Not on file  Social Connections: Not on file  Stress: Not on file  Tobacco Use: High Risk   Smoking Tobacco Use: Every Day   Smokeless Tobacco Use: Never  Transportation Needs: Not on file    [place]  Allergies   Allergen Reactions   Norvasc [Amlodipine Besylate] Other (See Comments)    Body aches    Medications Reviewed Today     Reviewed by Delorse Limber (Physician Assistant Certified) on 58/09/98 at [place] List Status: <None>   Medication Order Taking? Sig Documenting Provider Last Dose Status Informant  atorvastatin (LIPITOR) 20 MG tablet [id number] No TAKE 1 TABLET BY MOUTH EVERY DAY Kennyth Arnold, FNP Taking Active   Patient not taking:  Discontinued 11/16/20 1555   cilostazol (PLETAL) 100 MG tablet  [id number] No TAKE 1 TABLET BY MOUTH TWICE A DAY Kennyth Arnold, FNP Taking Active   Patient not taking:  Discontinued 11/16/20 1555            Med Note (BATES, TEQUILA   Mon Nov 29, 2019  9:32 AM) PRN  lisinopril (ZESTRIL) 20 MG tablet [id number] No TAKE 1 TABLET BY MOUTH EVERY DAY** Needs appt before anymore refills given.Libby Maw, MD Taking Active   metoprolol tartrate (LOPRESSOR) 25 MG tablet [id number] No TAKE 1 TABLET BY MOUTH EVERY DAY Kennyth Arnold, FNP Taking Active             Patient Active Problem List   Diagnosis Date Noted   Anemia 03/22/2020   History of gout 11/29/2019   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   Atherosclerotic vascular disease 06/03/2019   Chest discomfort 06/03/2019   H/O aortic aneurysm repair 06/03/2019   PVD (peripheral vascular disease) ([place]) 03/18/2019   Intermittent claudication ([place]) 01/01/2019   Elevated LDL cholesterol level 05/29/2018   Benign localized prostatic hyperplasia with lower urinary tract symptoms (LUTS) 05/26/2017   Elevated blood sugar 05/26/2017   Excessive cerumen in both ear canals 05/26/2017   Tobacco use 05/26/2017   Encounter for screening for lung cancer 05/26/2017   Sciatica of right side 05/12/2017   Acute bronchitis with COPD ([place]) 05/12/2017   Screening for hyperlipidemia 05/23/2016   Other abnormal glucose 05/22/2012   Nonspecific abnormal electrocardiogram (ECG) (EKG)  05/22/2012   Family history of prostate cancer 05/22/2012   INGUINAL HERNIA, LEFT, SMALL 05/10/2010   SPINAL STENOSIS, LUMBAR 04/26/2009   Lumbar radiculopathy 04/26/2009   DIVERTICULOSIS, COLON 12/20/2008   History of colonic polyps 12/20/2008   Mixed hyperlipidemia 08/06/2007   Essential hypertension 08/06/2007   SKIN CANCER, HX OF 08/06/2007    Immunization History  Administered Date(s) Administered   Fluad Quad(high Dose 65+) 03/18/2019, 03/22/2020   Influenza, High Dose Seasonal PF 05/26/2017   Moderna Sars-Covid-2 Vaccination 08/09/2019, 09/06/2019   Td 06/11/1999, 05/10/2010   Zoster Recombinat (Shingrix) 02/09/2019    Conditions to be addressed/monitored:  Hypertension, Hyperlipidemia, Tobacco use, Gout, and Peripheral Vascular Disease  Care Plan : General Pharmacy (Adult)  Updates made by Germaine Pomfret, RPH since 03/05/2021 12:00 AM     Problem: Hypertension, Hyperlipidemia, Tobacco use, Gout, and Peripheral Vascular Disease   Priority: High     Long-Range Goal: Patient-Specific Goal   Start Date: 11/27/2020  Expected End Date: 12/20/2021  This Visit's Progress: Not on track  Recent Progress: On track  Priority: High  Note:   Current Barriers:  No barriers noted  Pharmacist Clinical Goal(s):  Patient will maintain control of blood pressure as evidenced by BP less than 140/90  through collaboration with PharmD and provider.   Interventions: 1:1 collaboration with Libby Maw, MD regarding development and update of comprehensive plan of care as evidenced by provider attestation and co-signature Inter-disciplinary care team collaboration (see longitudinal plan of care) Comprehensive medication review performed; medication list updated in electronic medical record  Hypertension (BP goal <140/90) -Not ideally controlled -Current treatment: Lisinopril 20 mg daily  Metoprolol tartrate 25 mg daily  -Medications previously tried: NA  -Current home  readings: 118-122/75-80  -Denies hypotensive/ hypertensive symptoms: Dizziness symptoms have improved.  -Educated on Symptoms of hypotension and importance of maintaining adequate hydration; -Drinking 4-5 glasses of water daily  -Counseled to monitor BP at home 2-3 times weekly, document, and provide log at future appointments  Hyperlipidemia: (LDL  goal < 70) -Controlled -Current treatment: Atorvastatin 20 mg daily  -Current Peripheral Vascular Disease treatment: Cilostazol 100 mg twice daily  -Medications previously tried: NA  -Patient reports atorvastatin never refilled, courtesy refill sent in and patient to schedule follow-up with PCP to continue getting refills.  -Recommended starting aspirin 81 mg daily for cardiovascular protection give history of PVD, atherosclerotic vascular disease  Gout (Goal: Prevent gout flares) -Controlled -Last flare: June, July 2022   -Current treatment  Allopurinol 100 mg daily  -Medications previously tried: Allopurinol  -Counseled patient on low purine diet plan. Counseled patient to reduce consumption of high-fructose corn syrup, sweetened soft drinks, fruit juices, meat, and seafood. -Counseled patient to avoid alcohol consumption.  -Recommended to continue current medication  Tobacco use (Goal Quit smoking) -Uncontrolled -Patient reports smoking 1-2 cigars daily -Previous quit attempts: NA -Patient smokes After 30 minutes of waking -Patient triggers include: Reading a Book, and When Playing Golf -Patient is in a Pre-Contemplative stage of quitting. Counselled extensively on risks of continued cigarette use and benefits of quitting.  -Provided contact information for [place] Quit Line (1-800-QUIT-NOW) and encouraged patient to reach out to this group for support.  Patient Goals/Self-Care Activities Patient will:  - check blood pressure 2-3 times weekly, document, and provide at future appointments  Follow Up Plan: Telephone follow up appointment  with care management team member scheduled for:  09/06/21 at 8:30 AM       Medication Assistance: None required.  Patient affirms current coverage meets needs.  Compliance/Adherence/Medication fill history: Care Gaps: PCV13 Shingrix Tdap  Star-Rating Drugs: Atorvastatin 20 mg last filled on 09/09/2020 for [facility] day supply [facility]. Refill sent in  Lisinopril 20 mg last filled on 12/04/2020 [facility]. [facility]  [facility]  [id number] [place] [id number]: [phone]: [phone] Uses pill box? Yes Pt endorses 100% compliance  We discussed: Current pharmacy is preferred with insurance plan and patient is satisfied with pharmacy services Patient decided to: Continue current medication management strategy  Care Plan and Follow Up Patient Decision:  Patient agrees to Care Plan and Follow-up.  Plan: Telephone follow up appointment with care management team member scheduled for:  09/06/21 at 8:30 AM  AJunius Argyle PharmD, CPP Clinical Pharmacist [facility] [phone]

## 2021-03-09 DIAGNOSIS — E7800 Pure hypercholesterolemia, unspecified: Secondary | ICD-10-CM | POA: Diagnosis not present

## 2021-03-09 DIAGNOSIS — I10 Essential (primary) hypertension: Secondary | ICD-10-CM

## 2021-03-29 ENCOUNTER — Telehealth: Payer: Self-pay | Admitting: Family Medicine

## 2021-03-29 NOTE — Telephone Encounter (Signed)
Left message for patient to call back and schedule Medicare Annual Wellness Visit (AWV).   Please offer to do virtually or by telephone.  Left office number and my jabber [phone].  Last AWV:12/29/2019  Please schedule at anytime with Nurse Health Advisor.

## 2021-04-12 ENCOUNTER — Telehealth: Payer: Self-pay

## 2021-04-12 NOTE — Progress Notes (Signed)
    Chronic Care Management Pharmacy Assistant   Name: Roger Stewart  MRN: [id number] DOB: 1945-11-12  Reason for [place] Call.  Recent office visits:  No recent office visit  Recent consult visits:  No recent consult visit  Hospital visits:  None in previous 6 months  Medications: Outpatient Encounter Medications as of 04/12/2021  Medication Sig   allopurinol (ZYLOPRIM) 100 MG tablet Take 100 mg by mouth daily.   atorvastatin (LIPITOR) 20 MG tablet Take 1 tablet (20 mg total) by mouth daily. Needs appointment for additional refills   cilostazol (PLETAL) 100 MG tablet Take 1 tablet (100 mg total) by mouth 2 (two) times daily.   indomethacin (INDOCIN) 50 MG capsule Take 50 mg by mouth 2 (two) times daily as needed.   lisinopril (ZESTRIL) 20 MG tablet TAKE 1 TABLET BY MOUTH EVERY DAY** NEEDS APPT BEFORE ANYMORE REFILLS GIVEN.**   metoprolol tartrate (LOPRESSOR) 25 MG tablet TAKE 1 TABLET BY MOUTH EVERY DAY   No facility-administered encounter medications on file as of 04/12/2021.    Care Gaps: Pneumonia Vaccine Shingrix vaccine Tetanus Vaccine Influenza Vaccine Star Rating Drug: Atorvastatin 20 mg last filled on 03/05/2021 for [facility]. Lisinopril 20 mg last filled on 03/05/2021 [facility] Medication Fill Gaps: None ID  Reviewed chart prior to disease state call. Spoke with patient regarding BP  Recent Office Vitals: BP Readings from Last 3 Encounters:  11/14/20 102/65  10/09/20 102/64  03/22/20 100/64   Pulse Readings from Last 3 Encounters:  11/14/20 72  10/09/20 88  03/22/20 84    Wt Readings from Last 3 Encounters:  11/14/20 203 lb (92.1 kg)  10/09/20 206 lb 9.6 oz (93.7 kg)  03/22/20 209 lb 9.6 oz (95.1 kg)     Kidney Function Lab Results  Component Value Date/Time   CREATININE 0.88 10/10/2020 08:57 AM   CREATININE 0.81 11/29/2019 09:43 AM   GFR 84.05 10/10/2020 08:57 AM   GFRNONAA  90.35 12/20/2008 12:00 AM   GFRAA 110 12/24/2007 12:00 AM    BMP Latest Ref Rng & Units 10/10/2020 11/29/2019 05/31/2019  Glucose 70 - 99 mg/dL 92 115(H) 117(H)  BUN 6 - 23 mg/dL 17 18 17   Creatinine 0.40 - 1.50 mg/dL 0.88 0.81 0.82  Sodium 135 - 145 mEq/L 140 141 138  Potassium 3.5 - 5.1 mEq/L 4.5 4.0 4.5  Chloride 96 - 112 mEq/L 104 105 103  CO2 19 - 32 mEq/L 29 29 27   Calcium 8.4 - 10.5 mg/dL 8.8 8.6 9.4    Current antihypertensive regimen:  Lisinopril 20 mg daily  Metoprolol tartrate 25 mg daily   What recent interventions/DTPs have been made by any provider to improve Blood Pressure control since last CPP Visit: None ID I have attempted without success to contact this patient by phone three times to do her Hypertension Disease State call. I left a Voice message for patient to return my call.   Adherence Review: Is the patient currently on ACE/ARB medication? Yes Does the patient have >5 day gap between last estimated fill dates? No   [place] Clinical Production designer, theatre/television/film [phone]

## 2021-05-08 DIAGNOSIS — Z86010 Personal history of colonic polyps: Secondary | ICD-10-CM | POA: Diagnosis not present

## 2021-05-08 DIAGNOSIS — R031 Nonspecific low blood-pressure reading: Secondary | ICD-10-CM | POA: Diagnosis not present

## 2021-05-11 ENCOUNTER — Telehealth: Payer: Self-pay

## 2021-05-11 NOTE — Progress Notes (Signed)
    Chronic Care Management Pharmacy Assistant   Name: Roger Stewart  MRN: [id number] DOB: [birth year]  Reason for Encounter: Medication Review/ General Adherence Call.   Recent office visits:  No recent office visit   Recent consult visits:  No recent consult visit  Hospital visits:  None in previous 6 months  Medications: Outpatient Encounter Medications as of 05/11/2021  Medication Sig   allopurinol (ZYLOPRIM) 100 MG tablet Take 100 mg by mouth daily.   atorvastatin (LIPITOR) 20 MG tablet Take 1 tablet (20 mg total) by mouth daily. Needs appointment for additional refills   cilostazol (PLETAL) 100 MG tablet Take 1 tablet (100 mg total) by mouth 2 (two) times daily.   indomethacin (INDOCIN) 50 MG capsule Take 50 mg by mouth 2 (two) times daily as needed.   lisinopril (ZESTRIL) 20 MG tablet TAKE 1 TABLET BY MOUTH EVERY DAY** NEEDS APPT BEFORE ANYMORE REFILLS GIVEN.**   metoprolol tartrate (LOPRESSOR) 25 MG tablet TAKE 1 TABLET BY MOUTH EVERY DAY   No facility-administered encounter medications on file as of 05/11/2021.    Care Gaps: Pneumonia Vaccine Shingrix vaccine Tetanus Vaccine Influenza Vaccine Star Rating Drug: Atorvastatin 20 mg last filled on 03/05/2021 [facility]. Lisinopril 20 mg last filled on 03/05/2021 [facility] Medication Fill Gaps: None ID   Called patient and discussed medication adherence  with patient, no issues at this time with current medication.   Patient Denies ED visit since his last CPP follow up.  Patient Denies  any side effects with his medication. Patient Denies  any problems with hiscurrent pharmacy  Patient states he would like to schedule his physical and have some lab work completed.Patient states he will reach his PCP office and schedule it today.  Patient denies any questions or concerns for the clinical pharmacist at this time.  Telephone follow up appointment with Care management team  member scheduled for : 09/06/2021 at 8:30 am.  [place] Pharmacist Assistant [phone]

## 2021-05-30 ENCOUNTER — Other Ambulatory Visit: Payer: Self-pay | Admitting: Family

## 2021-05-30 DIAGNOSIS — I10 Essential (primary) hypertension: Secondary | ICD-10-CM

## 2021-05-31 ENCOUNTER — Other Ambulatory Visit: Payer: Self-pay | Admitting: Family Medicine

## 2021-05-31 DIAGNOSIS — E7800 Pure hypercholesterolemia, unspecified: Secondary | ICD-10-CM

## 2021-06-05 ENCOUNTER — Telehealth: Payer: Self-pay | Admitting: Family Medicine

## 2021-06-05 DIAGNOSIS — I10 Essential (primary) hypertension: Secondary | ICD-10-CM

## 2021-06-05 MED ORDER — LISINOPRIL 20 MG PO TABS: ORAL_TABLET | ORAL | 0 refills | Status: DC

## 2021-06-05 MED ORDER — METOPROLOL TARTRATE 25 MG PO TABS: 25.0000 mg | ORAL_TABLET | Freq: Every day | ORAL | 0 refills | Status: DC

## 2021-06-05 NOTE — Telephone Encounter (Signed)
Refills sent in

## 2021-06-06 ENCOUNTER — Ambulatory Visit (INDEPENDENT_AMBULATORY_CARE_PROVIDER_SITE_OTHER): Payer: Medicare HMO

## 2021-06-06 DIAGNOSIS — Z0000 Encounter for general adult medical examination without abnormal findings: Secondary | ICD-10-CM

## 2021-06-06 NOTE — Patient Instructions (Signed)
Mr. Roger Stewart , Thank you for taking time to come for your Medicare Wellness Visit. I appreciate your ongoing commitment to your health goals. Please review the following plan we discussed and let me know if I can assist you in the future.   Screening recommendations/referrals: Colonoscopy: no longer required  Recommended yearly ophthalmology/optometry visit for glaucoma screening and checkup Recommended yearly dental visit for hygiene and checkup  Vaccinations: Influenza vaccine: declined  Pneumococcal vaccine: completed  Tdap vaccine: due  Shingles vaccine: will consider     Advanced directives: will provide copies   Conditions/risks identified: none   Next appointment: none   Preventive Care 8 Years and Older, Male Preventive care refers to lifestyle choices and visits with your health care provider that can promote health and wellness. What does preventive care include? A yearly physical exam. This is also called an annual well check. Dental exams once or twice a year. Routine eye exams. Ask your health care provider how often you should have your eyes checked. Personal lifestyle choices, including: Daily care of your teeth and gums. Regular physical activity. Eating a healthy diet. Avoiding tobacco and drug use. Limiting alcohol use. Practicing safe sex. Taking low doses of aspirin every day. Taking vitamin and mineral supplements as recommended by your health care provider. What happens during an annual well check? The services and screenings done by your health care provider during your annual well check will depend on your age, overall health, lifestyle risk factors, and family history of disease. Counseling  Your health care provider may ask you questions about your: Alcohol use. Tobacco use. Drug use. Emotional well-being. Home and relationship well-being. Sexual activity. Eating habits. History of falls. Memory and ability to understand (cognition). Work and  work Statistician. Screening  You may have the following tests or measurements: Height, weight, and BMI. Blood pressure. Lipid and cholesterol levels. These may be checked every 5 years, or more frequently if you are over 22 years old. Skin check. Lung cancer screening. You may have this screening every year starting at age 79 if you have a 30-pack-year history of smoking and currently smoke or have quit within the past 15 years. Fecal occult blood test (FOBT) of the stool. You may have this test every year starting at age 79. Flexible sigmoidoscopy or colonoscopy. You may have a sigmoidoscopy every 5 years or a colonoscopy every 10 years starting at age 73. Prostate cancer screening. Recommendations will vary depending on your family history and other risks. Hepatitis C blood test. Hepatitis B blood test. Sexually transmitted disease (STD) testing. Diabetes screening. This is done by checking your blood sugar (glucose) after you have not eaten for a while (fasting). You may have this done every 1-3 years. Abdominal aortic aneurysm (AAA) screening. You may need this if you are a current or former smoker. Osteoporosis. You may be screened starting at age 76 if you are at high risk. Talk with your health care provider about your test results, treatment options, and if necessary, the need for more tests. Vaccines  Your health care provider may recommend certain vaccines, such as: Influenza vaccine. This is recommended every year. Tetanus, diphtheria, and acellular pertussis (Tdap, Td) vaccine. You may need a Td booster every 10 years. Zoster vaccine. You may need this after age 53. Pneumococcal 13-valent conjugate (PCV13) vaccine. One dose is recommended after age 68. Pneumococcal polysaccharide (PPSV23) vaccine. One dose is recommended after age 58. Talk to your health care provider about which screenings and vaccines  you need and how often you need them. This information is not intended to  replace advice given to you by your health care provider. Make sure you discuss any questions you have with your health care provider. Document Released: 06/23/2015 Document Revised: 02/14/2016 Document Reviewed: 03/28/2015 Elsevier Interactive Patient Education  2017 [place] Prevention in the Home Falls can cause injuries. They can happen to people of all ages. There are many things you can do to make your home safe and to help prevent falls. What can I do on the outside of my home? Regularly fix the edges of walkways and driveways and fix any cracks. Remove anything that might make you trip as you walk through a door, such as a raised step or threshold. Trim any bushes or trees on the path to your home. Use bright outdoor lighting. Clear any walking paths of anything that might make someone trip, such as rocks or tools. Regularly check to see if handrails are loose or broken. Make sure that both sides of any steps have handrails. Any raised decks and porches should have guardrails on the edges. Have any leaves, snow, or ice cleared regularly. Use sand or salt on walking paths during winter. Clean up any spills in your garage right away. This includes oil or grease spills. What can I do in the bathroom? Use night lights. Install grab bars by the toilet and in the tub and shower. Do not use towel bars as grab bars. Use non-skid mats or decals in the tub or shower. If you need to sit down in the shower, use a plastic, non-slip stool. Keep the floor dry. Clean up any water that spills on the floor as soon as it happens. Remove soap buildup in the tub or shower regularly. Attach bath mats securely with double-sided non-slip rug tape. Do not have throw rugs and other things on the floor that can make you trip. What can I do in the bedroom? Use night lights. Make sure that you have a light by your bed that is easy to reach. Do not use any sheets or blankets that are too big for  your bed. They should not hang down onto the floor. Have a firm chair that has side arms. You can use this for support while you get dressed. Do not have throw rugs and other things on the floor that can make you trip. What can I do in the kitchen? Clean up any spills right away. Avoid walking on wet floors. Keep items that you use a lot in easy-to-reach places. If you need to reach something above you, use a strong step stool that has a grab bar. Keep electrical cords out of the way. Do not use floor polish or wax that makes floors slippery. If you must use wax, use non-skid floor wax. Do not have throw rugs and other things on the floor that can make you trip. What can I do with my stairs? Do not leave any items on the stairs. Make sure that there are handrails on both sides of the stairs and use them. Fix handrails that are broken or loose. Make sure that handrails are as long as the stairways. Check any carpeting to make sure that it is firmly attached to the stairs. Fix any carpet that is loose or worn. Avoid having throw rugs at the top or bottom of the stairs. If you do have throw rugs, attach them to the floor with carpet tape. Make sure  that you have a light switch at the top of the stairs and the bottom of the stairs. If you do not have them, ask someone to add them for you. What else can I do to help prevent falls? Wear shoes that: Do not have high heels. Have rubber bottoms. Are comfortable and fit you well. Are closed at the toe. Do not wear sandals. If you use a stepladder: Make sure that it is fully opened. Do not climb a closed stepladder. Make sure that both sides of the stepladder are locked into place. Ask someone to hold it for you, if possible. Clearly mark and make sure that you can see: Any grab bars or handrails. First and last steps. Where the edge of each step is. Use tools that help you move around (mobility aids) if they are needed. These  include: Canes. Walkers. Scooters. Crutches. Turn on the lights when you go into a dark area. Replace any light bulbs as soon as they burn out. Set up your furniture so you have a clear path. Avoid moving your furniture around. If any of your floors are uneven, fix them. If there are any pets around you, be aware of where they are. Review your medicines with your doctor. Some medicines can make you feel dizzy. This can increase your chance of falling. Ask your doctor what other things that you can do to help prevent falls. This information is not intended to replace advice given to you by your health care provider. Make sure you discuss any questions you have with your health care provider. Document Released: 03/23/2009 Document Revised: 11/02/2015 Document Reviewed: 07/01/2014 Elsevier Interactive Patient Education  2017 [employer].

## 2021-06-06 NOTE — Progress Notes (Signed)
Subjective:   Roger Stewart is a 75 y.o. male who presents for an Subsequent Medicare Annual Wellness Visit.   I connected with Roger Stewart today by telephone and verified that I am speaking with the correct person using two identifiers. Location patient: home Location provider: work Persons participating in the virtual visit: patient, provider.   I discussed the limitations, risks, security and privacy concerns of performing an evaluation and management service by telephone and the availability of in person appointments. I also discussed with the patient that there may be a patient responsible charge related to this service. The patient expressed understanding and verbally consented to this telephonic visit.    Interactive audio and video telecommunications were attempted between this provider and patient, however failed, due to patient having technical difficulties OR patient did not have access to video capability.  We continued and completed visit with audio only.    Review of Systems     Cardiac Risk Factors include: advanced age (>13men, >78 women);male gender;dyslipidemia;hypertension     Objective:    Today's Vitals   There is no height or weight on file to calculate BMI.  Advanced Directives 06/06/2021 06/06/2021 12/29/2019  Does Patient Have a Medical Advance Directive? Yes Yes Yes  Type of Paramedic of [place];Living will [place];Living will [place];Living will  Copy of [place] in Chart? No - copy requested No - copy requested No - copy requested    Current Medications (verified) Outpatient Encounter Medications as of 06/06/2021  Medication Sig   allopurinol (ZYLOPRIM) 100 MG tablet Take 100 mg by mouth daily.   atorvastatin (LIPITOR) 20 MG tablet TAKE 1 TABLET BY MOUTH DAILY. NEEDS APPOINTMENT FOR ADDITIONAL REFILLS   cilostazol (PLETAL) 100 MG tablet Take 1 tablet (100 mg  total) by mouth 2 (two) times daily.   indomethacin (INDOCIN) 50 MG capsule Take 50 mg by mouth 2 (two) times daily as needed.   lisinopril (ZESTRIL) 20 MG tablet TAKE 1 TABLET BY MOUTH EVERY DAY. **NEEDS APPT BEFORE ANYMORE REFILLS GIVEN**   metoprolol tartrate (LOPRESSOR) 25 MG tablet Take 1 tablet (25 mg total) by mouth daily.   No facility-administered encounter medications on file as of 06/06/2021.    Allergies (verified) Norvasc [amlodipine besylate]   History: Past Medical History:  Diagnosis Date   Cancer ([place])    Basal Cell X 8; Dr Nevada Crane   Colon polyps    Dr Amedeo Plenty   Diverticulosis of colon    Hyperlipidemia    elevated TG   Hypertension    Sciatica    Past Surgical History:  Procedure Laterality Date   ABDOMINAL AORTIC ANEURYSM REPAIR  2005   colonoscopy with polypectomy  2010;2016   Dr Amedeo Plenty   [facility] X3     LS spine   LUMBAR SPINE SURGERY  04/2010   spur & bulging disc L 4-5, [place] , [place] SURGERY  08/2010   cyst resected @ op site   Family History  Problem Relation Age of Onset   Heart disease Mother        CHF ,CABG   Prostate cancer Father    Hypertension Father    Transient ischemic attack Father        in 106s   Hypertension Brother    Acromegaly Brother    Prostate cancer Brother    Leukemia Brother    Diabetes Neg Hx    Social History  Socioeconomic History   Marital status: Legally Separated    Spouse name: Not on file   Number of children: 1   Years of education: 28   Highest education level: Not on file  Occupational History   Not on file  Tobacco Use   Smoking status: Every Day    Types: Cigars    Last attempt to quit: 06/10/2000    Years since quitting: 21.0   Smokeless tobacco: Never   Tobacco comments:     Smoked [id number], up to 3/4 ppd. 05/22/15 1 cigar a day. 10/26/19 2-4 cigars daily  Vaping Use   Vaping Use: Never used  Substance and Sexual Activity   Alcohol use: Yes    Alcohol/week: 16.0 standard drinks     Types: 2 Glasses of wine, 14 Cans of beer per week   Drug use: No   Sexual activity: Not on file  Other Topics Concern   Not on file  Social History Narrative   Denies abuse and feels safe at home.    Social Determinants of Health   Financial Resource Strain: Low Risk    Difficulty of Paying Living Expenses: Not hard at all  Food Insecurity: No Food Insecurity   Worried About Charity fundraiser in the Last Year: Never true   [place] in the Last Year: Never true  Transportation Needs: No Transportation Needs   Lack of Transportation (Medical): No   Lack of Transportation (Non-Medical): No  Physical Activity: Inactive   Days of Exercise per Week: 0 days   Minutes of Exercise per Session: 0 min  Stress: No Stress Concern Present   Feeling of Stress : Not at all  Social Connections: Moderately Isolated   Frequency of Communication with Friends and Family: Twice a week   Frequency of Social Gatherings with Friends and Family: Twice a week   Attends Religious Services: Never   Marine scientist or Organizations: Yes   Attends Music therapist: More than 4 times per year   Marital Status: Separated    Tobacco Counseling Ready to quit: Not Answered Counseling given: Not Answered Tobacco comments:  Smoked [phone], up to 3/4 ppd. 05/22/15 1 cigar a day. 10/26/19 2-4 cigars daily   Clinical Intake:  Pre-visit preparation completed: Yes  Pain : No/denies pain     Nutritional Risks: None Diabetes: No  How often do you need to have someone help you when you read instructions, pamphlets, or other written materials from your doctor or pharmacy?: 1 - Never What is the last grade level you completed in school?: college  Diabetic?no   Interpreter Needed?: No  Information entered by :: [employer] of Daily Living In your present state of health, do you have any difficulty performing the following activities: 06/06/2021 06/05/2021   Hearing? N Y  Vision? N N  Difficulty concentrating or making decisions? N N  Walking or climbing stairs? N N  Dressing or bathing? N N  Doing errands, shopping? N N  Preparing Food and eating ? N N  Using the Toilet? N N  In the past six months, have you accidently leaked urine? N N  Do you have problems with loss of bowel control? N N  Managing your Medications? N N  Managing your Finances? N N  Housekeeping or managing your Housekeeping? N N  Some recent data might be hidden    Patient Care Team: Libby Maw, MD as PCP - General (  Family Medicine) Germaine Pomfret, [facility] as Pharmacist (Pharmacist)  Indicate any recent Medical Services you may have received from other than [facility] providers in the past year (date may be approximate).     Assessment:   This is a routine wellness examination for [place].  Hearing/Vision screen Vision Screening - Comments:: Annual eye exams wear  glasses   Dietary issues and exercise activities discussed: Current Exercise Habits: The patient does not participate in regular exercise at present   Goals Addressed   None    Depression Screen PHQ 2/9 Scores 06/06/2021 06/06/2021 10/09/2020 10/09/2020 12/29/2019 05/31/2019 05/23/2016  PHQ - 2 Score 0 0 1 0 0 0 0  PHQ- 9 Score - - 1 - - - -    Fall Risk Fall Risk  06/06/2021 06/05/2021 10/09/2020 12/29/2019 11/29/2019  Falls in the past year? 0 0 0 0 0  Comment - - - - -  Number falls in past yr: 0 0 - 0 -  Injury with Fall? 0 0 - 0 -  Follow up Falls evaluation completed - - Falls prevention discussed -    FALL RISK PREVENTION PERTAINING TO THE HOME:  Any stairs in or around the home? No  If so, are there any without handrails? No  Home free of loose throw rugs in walkways, pet beds, electrical cords, etc? Yes  Adequate lighting in your home to reduce risk of falls? Yes   ASSISTIVE DEVICES UTILIZED TO PREVENT FALLS:  Life alert? No  Use of a cane, walker or w/c? No  Grab bars in  the bathroom? Yes  Shower chair or bench in shower? Yes  Elevated toilet seat or a handicapped toilet? Yes    Cognitive Function: Normal cognitive status assessed by direct observation by this Nurse Health Advisor. No abnormalities found.         Immunizations Immunization History  Administered Date(s) Administered   Fluad Quad(high Dose 65+) 03/18/2019, 03/22/2020   Influenza, High Dose Seasonal PF 05/26/2017   Moderna Sars-Covid-2 Vaccination 08/09/2019, 09/06/2019   Td 06/11/1999, 05/10/2010   Zoster Recombinat (Shingrix) 02/09/2019    TDAP status: Due, Education has been provided regarding the importance of this vaccine. Advised may receive this vaccine at local pharmacy or Health Dept. Aware to provide a copy of the vaccination record if obtained from local pharmacy or Health Dept. Verbalized acceptance and understanding.  Flu Vaccine status: Declined, Education has been provided regarding the importance of this vaccine but patient still declined. Advised may receive this vaccine at local pharmacy or Health Dept. Aware to provide a copy of the vaccination record if obtained from local pharmacy or Health Dept. Verbalized acceptance and understanding.  Pneumococcal vaccine status: Up to date  Covid-19 vaccine status: Completed vaccines  Qualifies for Shingles Vaccine? Yes   Zostavax completed No   Shingrix Completed?: No.    Education has been provided regarding the importance of this vaccine. Patient has been advised to call insurance company to determine out of pocket expense if they have not yet received this vaccine. Advised may also receive vaccine at local pharmacy or Health Dept. Verbalized acceptance and understanding.  Screening Tests Health Maintenance  Topic Date Due   Pneumonia Vaccine 4+ Years old (1 - PCV) Never done   Zoster Vaccines- Shingrix (2 of 2) 04/06/2019   TETANUS/TDAP  05/10/2020   INFLUENZA VACCINE  01/08/2021   COLONOSCOPY (Pts 45-37yrs Insurance  coverage will need to be confirmed)  02/09/2025   Hepatitis C Screening  Completed  HPV VACCINES  Aged Out   COVID-19 Vaccine  Discontinued    Health Maintenance  Health Maintenance Due  Topic Date Due   Pneumonia Vaccine 19+ Years old (1 - PCV) Never done   Zoster Vaccines- Shingrix (2 of 2) 04/06/2019   TETANUS/TDAP  05/10/2020   INFLUENZA VACCINE  01/08/2021    Colorectal cancer screening: No longer required.   Lung Cancer Screening: (Low Dose CT Chest recommended if Age 11-80 years, 30 pack-year currently smoking OR have quit w/in 15years.) does not qualify.   Lung Cancer Screening Referral: n/a  Additional Screening:  Hepatitis C Screening: does not qualify; Completed 05/23/2016  Vision Screening: Recommended annual ophthalmology exams for early detection of glaucoma and other disorders of the eye. Is the patient up to date with their annual eye exam?  Yes  Who is the provider or what is the name of the office in which the patient attends annual eye exams? [facility]  If pt is not established with a provider, would they like to be referred to a provider to establish care? No .   Dental Screening: Recommended annual dental exams for proper oral hygiene  Community Resource Referral / Chronic Care Management: CRR required this visit?  No   CCM required this visit?  No      Plan:     I have personally reviewed and noted the following in the patients chart:   Medical and social history Use of alcohol, tobacco or illicit drugs  Current medications and supplements including opioid prescriptions. Patient is not currently taking opioid prescriptions. Functional ability and status Nutritional status Physical activity Advanced directives List of other physicians Hospitalizations, surgeries, and ER visits in previous 12 months Vitals Screenings to include cognitive, depression, and falls Referrals and appointments  In addition, I have reviewed and discussed with  patient certain preventive protocols, quality metrics, and best practice recommendations. A written personalized care plan for preventive services as well as general preventive health recommendations were provided to patient.     [name], LPN   [id number]   Nurse Notes: none

## 2021-06-08 ENCOUNTER — Other Ambulatory Visit: Payer: Self-pay | Admitting: Family Medicine

## 2021-06-08 DIAGNOSIS — I739 Peripheral vascular disease, unspecified: Secondary | ICD-10-CM

## 2021-06-13 ENCOUNTER — Telehealth: Payer: Self-pay

## 2021-06-13 NOTE — Progress Notes (Signed)
° ° °  Chronic Care Management Pharmacy Assistant   Name: Roger Stewart  MRN: [id number] DOB: 11/04/1945  Reason for Encounter: Medication Review/General Adherence Call.   Recent office visits:  06/06/2021 [name] LPN (PCP Office) Medicare annual wellness completed, No medication changes noted.  Recent consult visits:  No recent consult visit  Hospital visits:  None in previous 6 months  Medications: Outpatient Encounter Medications as of 06/13/2021  Medication Sig   allopurinol (ZYLOPRIM) 100 MG tablet Take 100 mg by mouth daily.   atorvastatin (LIPITOR) 20 MG tablet TAKE 1 TABLET BY MOUTH DAILY. NEEDS APPOINTMENT FOR ADDITIONAL REFILLS   cilostazol (PLETAL) 100 MG tablet TAKE 1 TABLET BY MOUTH TWICE A DAY   indomethacin (INDOCIN) 50 MG capsule Take 50 mg by mouth 2 (two) times daily as needed.   lisinopril (ZESTRIL) 20 MG tablet TAKE 1 TABLET BY MOUTH EVERY DAY. **NEEDS APPT BEFORE ANYMORE REFILLS GIVEN**   metoprolol tartrate (LOPRESSOR) 25 MG tablet Take 1 tablet (25 mg total) by mouth daily.   No facility-administered encounter medications on file as of 06/13/2021.   Care Gaps: Pneumonia Vaccine Shingrix vaccine Tetanus Vaccine Influenza Vaccine  Star Rating Drug: Atorvastatin 20 mg last filled on 06/01/2021 for [facility] day supply [facility]. Lisinopril 20 mg last filled on 06/01/2021 [facility] day supply at [facility]  Medication Fill Gaps: None ID  Called patient and discussed medication adherence  with patient, no issues at this time with current medication.   Patient Denies ED visit since his last CPP follow up.  Patient Denies  any side effects with his medication. Patient Denies  any problems with hiscurrent pharmacy  Tobacco: Do you currently use any tobacco products?   Yes, Patient states he smokes one cigar a day.  How many cigarettes a day are you smoking?  Patient denies smoking cigarettes, but reports smoking one cigar daily.  Is there anything we can  do to help support you? Patient states he is not interest in stopping at this time, and denies any help.  Patient states he would like to schedule a physical with his PCP in June 2023.Informed patient I would reach out to his PCP office to schedule him appointment.  I reach out to patient PCP office to schedule a physical with his PCP.Physical appointment was schedule on 11/13/2021 at 11:00 am.  Patient Verbalized understanding, and appreciation.   Telephone follow up appointment with Care management team member scheduled for : 09/06/2021 at 8:30 am.  [place] Pharmacist Assistant [phone]

## 2021-07-16 DIAGNOSIS — M5451 Vertebrogenic low back pain: Secondary | ICD-10-CM | POA: Diagnosis not present

## 2021-07-16 DIAGNOSIS — M9904 Segmental and somatic dysfunction of sacral region: Secondary | ICD-10-CM | POA: Diagnosis not present

## 2021-07-16 DIAGNOSIS — M62830 Muscle spasm of back: Secondary | ICD-10-CM | POA: Diagnosis not present

## 2021-07-16 DIAGNOSIS — M9905 Segmental and somatic dysfunction of pelvic region: Secondary | ICD-10-CM | POA: Diagnosis not present

## 2021-07-16 DIAGNOSIS — M9903 Segmental and somatic dysfunction of lumbar region: Secondary | ICD-10-CM | POA: Diagnosis not present

## 2021-08-21 DIAGNOSIS — M62830 Muscle spasm of back: Secondary | ICD-10-CM | POA: Diagnosis not present

## 2021-08-21 DIAGNOSIS — M9904 Segmental and somatic dysfunction of sacral region: Secondary | ICD-10-CM | POA: Diagnosis not present

## 2021-08-21 DIAGNOSIS — M5451 Vertebrogenic low back pain: Secondary | ICD-10-CM | POA: Diagnosis not present

## 2021-08-21 DIAGNOSIS — M9905 Segmental and somatic dysfunction of pelvic region: Secondary | ICD-10-CM | POA: Diagnosis not present

## 2021-08-21 DIAGNOSIS — M9903 Segmental and somatic dysfunction of lumbar region: Secondary | ICD-10-CM | POA: Diagnosis not present

## 2021-08-26 ENCOUNTER — Other Ambulatory Visit: Payer: Self-pay | Admitting: Family Medicine

## 2021-08-26 DIAGNOSIS — I10 Essential (primary) hypertension: Secondary | ICD-10-CM

## 2021-09-06 ENCOUNTER — Telehealth: Payer: Medicare HMO

## 2021-09-10 DIAGNOSIS — M9905 Segmental and somatic dysfunction of pelvic region: Secondary | ICD-10-CM | POA: Diagnosis not present

## 2021-09-10 DIAGNOSIS — M9904 Segmental and somatic dysfunction of sacral region: Secondary | ICD-10-CM | POA: Diagnosis not present

## 2021-09-10 DIAGNOSIS — M62830 Muscle spasm of back: Secondary | ICD-10-CM | POA: Diagnosis not present

## 2021-09-10 DIAGNOSIS — M9903 Segmental and somatic dysfunction of lumbar region: Secondary | ICD-10-CM | POA: Diagnosis not present

## 2021-09-10 DIAGNOSIS — M5451 Vertebrogenic low back pain: Secondary | ICD-10-CM | POA: Diagnosis not present

## 2021-09-19 ENCOUNTER — Telehealth: Payer: Self-pay | Admitting: Family Medicine

## 2021-09-19 NOTE — Telephone Encounter (Signed)
Pt needs a refill for his vitamin torvasatin he has 0 left. He needs it sent to [facility]. [phone] on [phone] . I am so sorry I failed to get the name of the pharmacy. ?

## 2021-09-19 NOTE — Telephone Encounter (Signed)
Found the pharmacy it is [facility] [phone] ?

## 2021-09-22 ENCOUNTER — Other Ambulatory Visit: Payer: Self-pay | Admitting: Family Medicine

## 2021-09-22 DIAGNOSIS — E7800 Pure hypercholesterolemia, unspecified: Secondary | ICD-10-CM

## 2021-10-11 ENCOUNTER — Ambulatory Visit (INDEPENDENT_AMBULATORY_CARE_PROVIDER_SITE_OTHER): Payer: Medicare HMO | Admitting: Family Medicine

## 2021-10-11 ENCOUNTER — Encounter: Payer: Self-pay | Admitting: Family Medicine

## 2021-10-11 VITALS — BP 100/68 | HR 76 | Temp 97.1°F | Ht 72.0 in | Wt 196.0 lb

## 2021-10-11 DIAGNOSIS — I10 Essential (primary) hypertension: Secondary | ICD-10-CM | POA: Diagnosis not present

## 2021-10-11 DIAGNOSIS — E7800 Pure hypercholesterolemia, unspecified: Secondary | ICD-10-CM

## 2021-10-11 DIAGNOSIS — I7090 Unspecified atherosclerosis: Secondary | ICD-10-CM

## 2021-10-11 DIAGNOSIS — E782 Mixed hyperlipidemia: Secondary | ICD-10-CM

## 2021-10-11 MED ORDER — LISINOPRIL 10 MG PO TABS: 10.0000 mg | ORAL_TABLET | Freq: Every day | ORAL | 3 refills | Status: DC

## 2021-10-11 NOTE — Progress Notes (Signed)
? ?Established Patient Office Visit ? ?Subjective   ?Patient ID: Roger Stewart, male    DOB: May 19, 1946  Age: 76 y.o. MRN: [id number] ? ?Chief Complaint  ?Patient presents with  ? Annual Exam  ?  CPE, becoming dizzy sometimes feel like BP meds should be adjusted. Discuss clots in right calf. Patient not fasting.   ? ? ?HPI for follow-up hypertension, hyperlipidemia, and peripheral vascular disease disease affecting the right lower limb.  He has noted increasing pain in his right calf with less activity.  Continues with atorvastatin and Pletal metoprolol and lisinopril.  Blood pressure has been running in the less than 110/60 range.  Many pressures are in the 95 range.  He has noted lightheadedness with standing frequently.  He has checked multiple blood pressures.  Continues to smoke cigars. ? ? ? ?Review of Systems  ?Constitutional: Negative.   ?HENT: Negative.    ?Eyes:  Negative for blurred vision, discharge and redness.  ?Respiratory: Negative.    ?Cardiovascular: Negative.   ?Gastrointestinal:  Negative for abdominal pain.  ?Genitourinary: Negative.   ?Musculoskeletal: Negative.  Negative for myalgias.  ?Skin:  Negative for rash.  ?Neurological:  Positive for dizziness. Negative for tingling, loss of consciousness, weakness and headaches.  ?Endo/Heme/Allergies:  Negative for polydipsia.  ? ?  ?Objective:  ?  ? ?BP 100/68 (BP Location: Left Arm, Patient Position: Sitting, Cuff Size: Normal)   Pulse 76   Temp (!) 97.1 ?F (36.2 ?C) (Temporal)   Ht 6' (1.829 m)   Wt 196 lb (88.9 kg)   SpO2 95%   BMI 26.58 kg/m?  ?BP Readings from Last 3 Encounters:  ?10/11/21 100/68  ?11/14/20 102/65  ?10/09/20 102/64  ? ?Wt Readings from Last 3 Encounters:  ?10/11/21 196 lb (88.9 kg)  ?11/14/20 203 lb (92.1 kg)  ?10/09/20 206 lb 9.6 oz (93.7 kg)  ? ?  ? ?Physical Exam ?Constitutional:   ?   General: He is not in acute distress. ?   Appearance: Normal appearance. He is not ill-appearing, toxic-appearing or diaphoretic.   ?HENT:  ?   Head: Normocephalic and atraumatic.  ?   Right Ear: External ear normal.  ?   Left Ear: External ear normal.  ?   Mouth/Throat:  ?   Mouth: Mucous membranes are moist.  ?   Pharynx: Oropharynx is clear. No oropharyngeal exudate or posterior oropharyngeal erythema.  ?Eyes:  ?   General: No scleral icterus.    ?   Right eye: No discharge.     ?   Left eye: No discharge.  ?   Extraocular Movements: Extraocular movements intact.  ?   Conjunctiva/sclera: Conjunctivae normal.  ?   Pupils: Pupils are equal, round, and reactive to light.  ?Cardiovascular:  ?   Rate and Rhythm: Normal rate and regular rhythm.  ?   Pulses:     ?     Dorsalis pedis pulses are 0 on the right side and 1+ on the left side.  ?     Posterior tibial pulses are 0 on the right side and 1+ on the left side.  ?   Comments: Greater than 3 seconds capillary refill right great toe. ? ? ?Less than 3-second Valeri refill in left great toe. ?Pulmonary:  ?   Effort: Pulmonary effort is normal. No respiratory distress.  ?   Breath sounds: Normal breath sounds.  ?Abdominal:  ?   General: Bowel sounds are normal.  ?   Tenderness: There is no  abdominal tenderness. There is no guarding.  ?Musculoskeletal:  ?   Cervical back: No rigidity or tenderness.  ?Skin: ?   General: Skin is warm and dry.  ?Neurological:  ?   Mental Status: He is alert and oriented to person, place, and time.  ?Psychiatric:     ?   Mood and Affect: Mood normal.     ?   Behavior: Behavior normal.  ? ? ? ?No results found for any visits on 10/11/21. ? ? ? ?The 10-year ASCVD risk score (Arnett DK, et al., 2019) is: 17.8% ? ?  ?Assessment & Plan:  ? ?Problem List Items Addressed This Visit   ? ?  ? Cardiovascular and Mediastinum  ? Essential hypertension - Primary  ? Relevant Orders  ? Comprehensive metabolic panel  ? Urinalysis, Routine w reflex microscopic  ? CBC  ? Atherosclerotic vascular disease  ? Relevant Orders  ? Ambulatory referral to Vascular Surgery  ? Lipid panel  ?  ?  Other  ? Mixed hyperlipidemia  ? Relevant Orders  ? Lipid panel  ? Elevated LDL cholesterol level  ? Relevant Orders  ? Lipid panel  ? LDL cholesterol, direct  ? ? ?Return in about 4 weeks (around 11/08/2021).  ?Have discontinued lisinopril.  Continue metoprolol.  We will continue to check and record blood pressures.  Will return fasting for above ordered blood work.  Continue cilostazol atorvastatin.  Referral back to vascular surgery possible stenting. ? ?Libby Maw, MD ? ?

## 2021-10-12 ENCOUNTER — Other Ambulatory Visit (INDEPENDENT_AMBULATORY_CARE_PROVIDER_SITE_OTHER): Payer: Medicare HMO

## 2021-10-12 DIAGNOSIS — I10 Essential (primary) hypertension: Secondary | ICD-10-CM

## 2021-10-12 DIAGNOSIS — I7090 Unspecified atherosclerosis: Secondary | ICD-10-CM | POA: Diagnosis not present

## 2021-10-12 DIAGNOSIS — E782 Mixed hyperlipidemia: Secondary | ICD-10-CM | POA: Diagnosis not present

## 2021-10-12 DIAGNOSIS — E7800 Pure hypercholesterolemia, unspecified: Secondary | ICD-10-CM | POA: Diagnosis not present

## 2021-10-12 LAB — COMPREHENSIVE METABOLIC PANEL
ALT: 13 U/L (ref 0–53)
AST: 16 U/L (ref 0–37)
Albumin: 3.8 g/dL (ref 3.5–5.2)
Alkaline Phosphatase: 73 U/L (ref 39–117)
BUN: 24 mg/dL — ABNORMAL HIGH (ref 6–23)
CO2: 29 mEq/L (ref 19–32)
Calcium: 9 mg/dL (ref 8.4–10.5)
Chloride: 106 mEq/L (ref 96–112)
Creatinine, Ser: 0.91 mg/dL (ref 0.40–1.50)
GFR: 81.8 mL/min (ref >60.00)
Glucose, Bld: 96 mg/dL (ref 70–99)
Potassium: 4.2 mEq/L (ref 3.5–5.1)
Sodium: 141 mEq/L (ref 135–145)
Total Bilirubin: 0.5 mg/dL (ref 0.2–1.2)
Total Protein: 6.8 g/dL (ref 6.0–8.3)

## 2021-10-12 LAB — URINALYSIS, ROUTINE W REFLEX MICROSCOPIC
Hgb urine dipstick: NEGATIVE
Ketones, ur: NEGATIVE
Leukocytes,Ua: NEGATIVE
Nitrite: NEGATIVE
RBC / HPF: NONE SEEN (ref 0–?)
Specific Gravity, Urine: >=1.03 — AB (ref 1.000–1.030)
Total Protein, Urine: NEGATIVE
Urine Glucose: NEGATIVE
Urobilinogen, UA: 0.2 (ref 0.0–1.0)
pH: 5.5 (ref 5.0–8.0)

## 2021-10-12 LAB — CBC
HCT: 38.6 % — ABNORMAL LOW (ref 39.0–52.0)
Hemoglobin: 13 g/dL (ref 13.0–17.0)
MCHC: 33.7 g/dL (ref 30.0–36.0)
MCV: 97.5 fl (ref 78.0–100.0)
Platelets: 177 10*3/uL (ref 150.0–400.0)
RBC: 3.96 Mil/uL — ABNORMAL LOW (ref 4.22–5.81)
RDW: 14.2 % (ref 11.5–15.5)
WBC: 6.4 10*3/uL (ref 4.0–10.5)

## 2021-10-12 LAB — LIPID PANEL
Cholesterol: 136 mg/dL (ref 0–200)
HDL: 69.7 mg/dL (ref >39.00)
LDL Cholesterol: 56 mg/dL (ref 0–99)
NonHDL: 66.34
Total CHOL/HDL Ratio: 2
Triglycerides: 54 mg/dL (ref 0.0–149.0)
VLDL: 10.8 mg/dL (ref 0.0–40.0)

## 2021-10-12 LAB — LDL CHOLESTEROL, DIRECT: Direct LDL: 56 mg/dL

## 2021-11-01 DIAGNOSIS — M9903 Segmental and somatic dysfunction of lumbar region: Secondary | ICD-10-CM | POA: Diagnosis not present

## 2021-11-01 DIAGNOSIS — M62830 Muscle spasm of back: Secondary | ICD-10-CM | POA: Diagnosis not present

## 2021-11-01 DIAGNOSIS — M9905 Segmental and somatic dysfunction of pelvic region: Secondary | ICD-10-CM | POA: Diagnosis not present

## 2021-11-01 DIAGNOSIS — M9904 Segmental and somatic dysfunction of sacral region: Secondary | ICD-10-CM | POA: Diagnosis not present

## 2021-11-01 DIAGNOSIS — M47817 Spondylosis without myelopathy or radiculopathy, lumbosacral region: Secondary | ICD-10-CM | POA: Diagnosis not present

## 2021-11-01 DIAGNOSIS — M5432 Sciatica, left side: Secondary | ICD-10-CM | POA: Diagnosis not present

## 2021-11-02 DIAGNOSIS — M9905 Segmental and somatic dysfunction of pelvic region: Secondary | ICD-10-CM | POA: Diagnosis not present

## 2021-11-02 DIAGNOSIS — M9903 Segmental and somatic dysfunction of lumbar region: Secondary | ICD-10-CM | POA: Diagnosis not present

## 2021-11-02 DIAGNOSIS — M62830 Muscle spasm of back: Secondary | ICD-10-CM | POA: Diagnosis not present

## 2021-11-02 DIAGNOSIS — M47817 Spondylosis without myelopathy or radiculopathy, lumbosacral region: Secondary | ICD-10-CM | POA: Diagnosis not present

## 2021-11-02 DIAGNOSIS — M9904 Segmental and somatic dysfunction of sacral region: Secondary | ICD-10-CM | POA: Diagnosis not present

## 2021-11-02 DIAGNOSIS — M5432 Sciatica, left side: Secondary | ICD-10-CM | POA: Diagnosis not present

## 2021-11-08 DIAGNOSIS — M9905 Segmental and somatic dysfunction of pelvic region: Secondary | ICD-10-CM | POA: Diagnosis not present

## 2021-11-08 DIAGNOSIS — M62830 Muscle spasm of back: Secondary | ICD-10-CM | POA: Diagnosis not present

## 2021-11-08 DIAGNOSIS — M47817 Spondylosis without myelopathy or radiculopathy, lumbosacral region: Secondary | ICD-10-CM | POA: Diagnosis not present

## 2021-11-08 DIAGNOSIS — M5432 Sciatica, left side: Secondary | ICD-10-CM | POA: Diagnosis not present

## 2021-11-08 DIAGNOSIS — M9903 Segmental and somatic dysfunction of lumbar region: Secondary | ICD-10-CM | POA: Diagnosis not present

## 2021-11-08 DIAGNOSIS — M9904 Segmental and somatic dysfunction of sacral region: Secondary | ICD-10-CM | POA: Diagnosis not present

## 2021-11-09 DIAGNOSIS — M9903 Segmental and somatic dysfunction of lumbar region: Secondary | ICD-10-CM | POA: Diagnosis not present

## 2021-11-09 DIAGNOSIS — M9905 Segmental and somatic dysfunction of pelvic region: Secondary | ICD-10-CM | POA: Diagnosis not present

## 2021-11-09 DIAGNOSIS — M62830 Muscle spasm of back: Secondary | ICD-10-CM | POA: Diagnosis not present

## 2021-11-09 DIAGNOSIS — M5432 Sciatica, left side: Secondary | ICD-10-CM | POA: Diagnosis not present

## 2021-11-09 DIAGNOSIS — M9904 Segmental and somatic dysfunction of sacral region: Secondary | ICD-10-CM | POA: Diagnosis not present

## 2021-11-09 DIAGNOSIS — M47817 Spondylosis without myelopathy or radiculopathy, lumbosacral region: Secondary | ICD-10-CM | POA: Diagnosis not present

## 2021-11-12 DIAGNOSIS — M25512 Pain in left shoulder: Secondary | ICD-10-CM | POA: Diagnosis not present

## 2021-11-12 DIAGNOSIS — M19012 Primary osteoarthritis, left shoulder: Secondary | ICD-10-CM | POA: Diagnosis not present

## 2021-11-12 DIAGNOSIS — M47812 Spondylosis without myelopathy or radiculopathy, cervical region: Secondary | ICD-10-CM | POA: Diagnosis not present

## 2021-11-13 ENCOUNTER — Encounter: Payer: Medicare HMO | Admitting: Family Medicine

## 2021-11-25 ENCOUNTER — Other Ambulatory Visit: Payer: Self-pay | Admitting: Family Medicine

## 2021-11-25 DIAGNOSIS — I10 Essential (primary) hypertension: Secondary | ICD-10-CM

## 2021-11-26 ENCOUNTER — Other Ambulatory Visit: Payer: Self-pay | Admitting: Family Medicine

## 2021-11-28 DIAGNOSIS — D122 Benign neoplasm of ascending colon: Secondary | ICD-10-CM | POA: Diagnosis not present

## 2021-11-28 DIAGNOSIS — Z86010 Personal history of colonic polyps: Secondary | ICD-10-CM | POA: Diagnosis not present

## 2021-11-28 DIAGNOSIS — K5730 Diverticulosis of large intestine without perforation or abscess without bleeding: Secondary | ICD-10-CM | POA: Diagnosis not present

## 2021-11-28 DIAGNOSIS — Z09 Encounter for follow-up examination after completed treatment for conditions other than malignant neoplasm: Secondary | ICD-10-CM | POA: Diagnosis not present

## 2021-11-30 DIAGNOSIS — D122 Benign neoplasm of ascending colon: Secondary | ICD-10-CM | POA: Diagnosis not present

## 2021-12-04 DIAGNOSIS — C4442 Squamous cell carcinoma of skin of scalp and neck: Secondary | ICD-10-CM | POA: Diagnosis not present

## 2021-12-06 ENCOUNTER — Other Ambulatory Visit: Payer: Self-pay | Admitting: Family Medicine

## 2021-12-06 DIAGNOSIS — I739 Peripheral vascular disease, unspecified: Secondary | ICD-10-CM

## 2021-12-12 DIAGNOSIS — R293 Abnormal posture: Secondary | ICD-10-CM | POA: Diagnosis not present

## 2021-12-12 DIAGNOSIS — M6281 Muscle weakness (generalized): Secondary | ICD-10-CM | POA: Diagnosis not present

## 2021-12-12 DIAGNOSIS — M25512 Pain in left shoulder: Secondary | ICD-10-CM | POA: Diagnosis not present

## 2021-12-12 DIAGNOSIS — M25612 Stiffness of left shoulder, not elsewhere classified: Secondary | ICD-10-CM | POA: Diagnosis not present

## 2021-12-12 DIAGNOSIS — M19012 Primary osteoarthritis, left shoulder: Secondary | ICD-10-CM | POA: Diagnosis not present

## 2021-12-13 ENCOUNTER — Other Ambulatory Visit: Payer: Self-pay | Admitting: *Deleted

## 2021-12-13 DIAGNOSIS — I739 Peripheral vascular disease, unspecified: Secondary | ICD-10-CM

## 2021-12-17 DIAGNOSIS — M6281 Muscle weakness (generalized): Secondary | ICD-10-CM | POA: Diagnosis not present

## 2021-12-17 DIAGNOSIS — M25512 Pain in left shoulder: Secondary | ICD-10-CM | POA: Diagnosis not present

## 2021-12-17 DIAGNOSIS — M25612 Stiffness of left shoulder, not elsewhere classified: Secondary | ICD-10-CM | POA: Diagnosis not present

## 2021-12-17 DIAGNOSIS — R293 Abnormal posture: Secondary | ICD-10-CM | POA: Diagnosis not present

## 2021-12-17 DIAGNOSIS — M19012 Primary osteoarthritis, left shoulder: Secondary | ICD-10-CM | POA: Diagnosis not present

## 2021-12-21 ENCOUNTER — Other Ambulatory Visit: Payer: Self-pay | Admitting: Family Medicine

## 2021-12-21 DIAGNOSIS — E7800 Pure hypercholesterolemia, unspecified: Secondary | ICD-10-CM

## 2022-01-08 ENCOUNTER — Ambulatory Visit (HOSPITAL_COMMUNITY)
Admission: RE | Admit: 2022-01-08 | Discharge: 2022-01-08 | Disposition: A | Discharge status: Home / Self Care | Payer: Medicare HMO | Source: Ambulatory Visit | Attending: Vascular Surgery | Admitting: Vascular Surgery

## 2022-01-08 ENCOUNTER — Ambulatory Visit: Payer: Medicare HMO | Admitting: Vascular Surgery

## 2022-01-08 ENCOUNTER — Encounter: Payer: Self-pay | Admitting: Vascular Surgery

## 2022-01-08 VITALS — BP 151/93 | HR 69 | Temp 97.9°F | Resp 18 | Ht 72.0 in | Wt 191.0 lb

## 2022-01-08 DIAGNOSIS — I70219 Atherosclerosis of native arteries of extremities with intermittent claudication, unspecified extremity: Secondary | ICD-10-CM | POA: Insufficient documentation

## 2022-01-08 DIAGNOSIS — I739 Peripheral vascular disease, unspecified: Secondary | ICD-10-CM

## 2022-01-08 DIAGNOSIS — I70211 Atherosclerosis of native arteries of extremities with intermittent claudication, right leg: Secondary | ICD-10-CM

## 2022-01-08 NOTE — Progress Notes (Signed)
Patient name: Roger Stewart MRN: [id number] DOB: [birth year] Sex: male  REASON FOR CONSULT: 1 year follow-up for right lower extremity claudication  HPI: Roger Stewart is a 76 y.o. male, with history of hypertension and hyperlipidemia that presents for 1 year follow-up of claudication in his right leg.  He was initially seen with burning in the right calf after walking about 5 to 10 minutes.  He spends about half his year in [place].  Sounds like he is still able to play golf but cannot play tennis or pickle ball.  States he can walk about a quarter of a mile depending on his pace.  No rest pain or tissue loss.  Past Medical History:  Diagnosis Date   Cancer ([place])    Basal Cell X 8; Dr Nevada Crane   Colon polyps    Dr Amedeo Plenty   Diverticulosis of colon    Hyperlipidemia    elevated TG   Hypertension    Sciatica     Past Surgical History:  Procedure Laterality Date   ABDOMINAL AORTIC ANEURYSM REPAIR  2005   colonoscopy with polypectomy  2010;2016   Dr Amedeo Plenty   [facility] X3     LS spine   LUMBAR SPINE SURGERY  04/2010   spur & bulging disc L 4-5, [place] , [place] SURGERY  08/2010   cyst resected @ op site    Family History  Problem Relation Age of Onset   Heart disease Mother        CHF ,CABG   Prostate cancer Father    Hypertension Father    Transient ischemic attack Father        in 97s   Hypertension Brother    Acromegaly Brother    Prostate cancer Brother    Leukemia Brother    Diabetes Neg Hx     SOCIAL HISTORY: Social History   Socioeconomic History   Marital status: Legally Separated    Spouse name: Not on file   Number of children: 1   Years of education: 16   Highest education level: Not on file  Occupational History   Not on file  Tobacco Use   Smoking status: Every Day    Types: Cigars    Last attempt to quit: 06/10/2000    Years since quitting: 21.5   Smokeless tobacco: Never   Tobacco comments:     Smoked [phone], up to 3/4 ppd. 05/22/15 1  cigar a day. 10/26/19 2-4 cigars daily  Vaping Use   Vaping Use: Never used  Substance and Sexual Activity   Alcohol use: Yes    Alcohol/week: 16.0 standard drinks of alcohol    Types: 2 Glasses of wine, 14 Cans of beer per week   Drug use: No   Sexual activity: Not on file  Other Topics Concern   Not on file  Social History Narrative   Denies abuse and feels safe at home.    Social Determinants of Health   Financial Resource Strain: Low Risk  (06/06/2021)   Overall Financial Resource Strain (CARDIA)    Difficulty of Paying Living Expenses: Not hard at all  Food Insecurity: No Food Insecurity (06/06/2021)   Hunger Vital Sign    Worried About Running Out of Food in the Last Year: Never true    Ran Out of Food in the Last Year: Never true  Transportation Needs: No Transportation Needs (06/06/2021)   PRAPARE - Hydrologist (Medical):  No    Lack of Transportation (Non-Medical): No  Physical Activity: Inactive (06/06/2021)   Exercise Vital Sign    Days of Exercise per Week: 0 days    Minutes of Exercise per Session: 0 min  Stress: No Stress Concern Present (06/06/2021)   [place]    Feeling of Stress : Not at all  Social Connections: Moderately Isolated (06/06/2021)   Social Connection and Isolation Panel [NHANES]    Frequency of Communication with Friends and Family: Twice a week    Frequency of Social Gatherings with Friends and Family: Twice a week    Attends Religious Services: Never    Marine scientist or Organizations: Yes    Attends Music therapist: More than 4 times per year    Marital Status: Separated  Intimate Partner Violence: Not At Risk (06/06/2021)   Humiliation, Afraid, Rape, and Kick questionnaire    Fear of Current or Ex-Partner: No    Emotionally Abused: No    Physically Abused: No    Sexually Abused: No    Allergies  Allergen Reactions    Norvasc [Amlodipine Besylate] Other (See Comments)    Body aches    Current Outpatient Medications  Medication Sig Dispense Refill   allopurinol (ZYLOPRIM) 100 MG tablet Take 100 mg by mouth daily.     atorvastatin (LIPITOR) 20 MG tablet TAKE 1 TABLET BY MOUTH EVERY DAY NEEDS APPOINTMENT FOR ADDITIONAL REFILLS 90 tablet 3   cilostazol (PLETAL) 100 MG tablet TAKE 1 TABLET BY MOUTH TWICE A DAY 180 tablet 1   indomethacin (INDOCIN) 50 MG capsule Take 50 mg by mouth 2 (two) times daily as needed.     metoprolol tartrate (LOPRESSOR) 25 MG tablet TAKE 1 TABLET BY MOUTH EVERY DAY 90 tablet 2   lisinopril (ZESTRIL) 20 MG tablet TAKE 1 TABLET BY MOUTH EVERY DAY. **NEEDS APPT BEFORE ANYMORE REFILLS GIVEN** 90 tablet 3   No current facility-administered medications for this visit.    REVIEW OF SYSTEMS:  '[X]'$  denotes positive finding, '[ ]'$  denotes negative finding Cardiac  Comments:  Chest pain or chest pressure:    Shortness of breath upon exertion:    Short of breath when lying flat:    Irregular heart rhythm:        Vascular    Pain in calf, thigh, or hip brought on by ambulation: x Right  Pain in feet at night that wakes you up from your sleep:     Blood clot in your veins:    Leg swelling:         Pulmonary    Oxygen at home:    Productive cough:     Wheezing:         Neurologic    Sudden weakness in arms or legs:     Sudden numbness in arms or legs:     Sudden onset of difficulty speaking or slurred speech:    Temporary loss of vision in one eye:     Problems with dizziness:         Gastrointestinal    Blood in stool:     Vomited blood:         Genitourinary    Burning when urinating:     Blood in urine:        Psychiatric    Major depression:         Hematologic    Bleeding problems:    Problems with  blood clotting too easily:        Skin    Rashes or ulcers:        Constitutional    Fever or chills:      PHYSICAL EXAM: Vitals:   01/08/22 1206  BP: (!)  151/93  Pulse: 69  Resp: 18  Temp: 97.9 F (36.6 C)  TempSrc: Temporal  SpO2: 94%  Weight: 191 lb (86.6 kg)  Height: 6' (1.829 m)    GENERAL: The patient is a well-nourished male, in no acute distress. The vital signs are documented above. CARDIAC: There is a regular rate and rhythm.  VASCULAR:  2+ palpable femoral pulse bilaterally Left dorsalis pedis and posterior tibial pulse palpable No palpable right pedal pulses PULMONARY: No respiratory  ABDOMEN: Soft and non-tender. MUSCULOSKELETAL: There are no major deformities or cyanosis. NEUROLOGIC: No focal weakness or paresthesias are detected. SKIN: There are no ulcers or rashes noted. PSYCHIATRIC: The patient has a normal affect.  DATA:   ABIs are 0.64 on the right monophasic (previously 0.8) and 1.01 on the left triphasic  Assessment/Plan:  76 year old male presents for follow-up of his right lower extremity claudication.   Sounds like he is still able to play golf but is severely limited when doing more active sports like tennis and pickleball.  I discussed that we should reserve intervention for claudication when it becomes lifestyle limiting.  Discussed he has no signs of rest pain or tissue loss to suggest critical limb ischemia that would be a much more urgent need for intervention.  I did discuss basic steps of transfemoral access with aortogram and lower extreme arteriogram and possible endovascular intervention with a focus on the right leg.  He is going to think about his options and may contact us to schedule intervention.  Otherwise I will set up a follow-up in 1 year with ABIs.  Marty Heck, MD [facility] Office: [place]

## 2022-01-10 ENCOUNTER — Other Ambulatory Visit: Payer: Self-pay

## 2022-01-10 DIAGNOSIS — I739 Peripheral vascular disease, unspecified: Secondary | ICD-10-CM

## 2022-01-18 ENCOUNTER — Telehealth: Payer: Self-pay

## 2022-01-18 NOTE — Telephone Encounter (Signed)
Returned call to pt in regards to scheduling surgery. Offered pt 01/24/22 to have procedure done, pt declined and stated "let me think about that" he stated that he has a golf event and would definitely be free towards the end of October. I advised the pt to call our office back some time in mid September to schedule. He agreed with this plan.

## 2022-01-22 ENCOUNTER — Other Ambulatory Visit: Payer: Self-pay

## 2022-01-22 ENCOUNTER — Telehealth: Payer: Self-pay

## 2022-01-22 DIAGNOSIS — I739 Peripheral vascular disease, unspecified: Secondary | ICD-10-CM

## 2022-01-22 DIAGNOSIS — I70211 Atherosclerosis of native arteries of extremities with intermittent claudication, right leg: Secondary | ICD-10-CM

## 2022-01-22 NOTE — Telephone Encounter (Signed)
Patient contacted office stating he would like to proceed with scheduling aortogram and requested Aug 17 if available. Patient scheduled- instructions reviewed. Patient verbalized understanding.

## 2022-01-24 ENCOUNTER — Telehealth: Payer: Self-pay | Admitting: Vascular Surgery

## 2022-01-24 ENCOUNTER — Ambulatory Visit (HOSPITAL_BASED_OUTPATIENT_CLINIC_OR_DEPARTMENT_OTHER): Payer: Medicare HMO

## 2022-01-24 ENCOUNTER — Ambulatory Visit (HOSPITAL_COMMUNITY)
Admission: RE | Admit: 2022-01-24 | Discharge: 2022-01-24 | Disposition: A | Discharge status: Home / Self Care | Payer: Medicare HMO | Attending: Vascular Surgery | Admitting: Vascular Surgery

## 2022-01-24 ENCOUNTER — Encounter (HOSPITAL_COMMUNITY)
Admission: RE | Disposition: A | Discharge status: Home / Self Care | Payer: Self-pay | Source: Home / Self Care | Attending: Vascular Surgery

## 2022-01-24 DIAGNOSIS — E785 Hyperlipidemia, unspecified: Secondary | ICD-10-CM | POA: Diagnosis not present

## 2022-01-24 DIAGNOSIS — Z87891 Personal history of nicotine dependence: Secondary | ICD-10-CM | POA: Insufficient documentation

## 2022-01-24 DIAGNOSIS — Z01810 Encounter for preprocedural cardiovascular examination: Secondary | ICD-10-CM

## 2022-01-24 DIAGNOSIS — I10 Essential (primary) hypertension: Secondary | ICD-10-CM | POA: Insufficient documentation

## 2022-01-24 DIAGNOSIS — I70211 Atherosclerosis of native arteries of extremities with intermittent claudication, right leg: Secondary | ICD-10-CM | POA: Insufficient documentation

## 2022-01-24 DIAGNOSIS — I739 Peripheral vascular disease, unspecified: Secondary | ICD-10-CM

## 2022-01-24 HISTORY — PX: ABDOMINAL AORTOGRAM W/LOWER EXTREMITY: CATH118223

## 2022-01-24 LAB — POCT I-STAT, CHEM 8
BUN: 14 mg/dL (ref 8–23)
Calcium, Ion: 1.13 mmol/L — ABNORMAL LOW (ref 1.15–1.40)
Chloride: 104 mmol/L (ref 98–111)
Creatinine, Ser: 0.8 mg/dL (ref 0.61–1.24)
Glucose, Bld: 112 mg/dL — ABNORMAL HIGH (ref 70–99)
HCT: 41 % (ref 39.0–52.0)
Hemoglobin: 13.9 g/dL (ref 13.0–17.0)
Potassium: 4.1 mmol/L (ref 3.5–5.1)
Sodium: 141 mmol/L (ref 135–145)
TCO2: 27 mmol/L (ref 22–32)

## 2022-01-24 SURGERY — ABDOMINAL AORTOGRAM W/LOWER EXTREMITY
Anesthesia: LOCAL | Laterality: Right

## 2022-01-24 MED ORDER — HEPARIN (PORCINE) IN NACL 1000-0.9 UT/500ML-% IV SOLN
INTRAVENOUS | Status: DC | PRN
  Administered 2022-01-24 (×2): 500 mL

## 2022-01-24 MED ORDER — LIDOCAINE HCL (PF) 1 % IJ SOLN
INTRAMUSCULAR | Status: DC | PRN
  Administered 2022-01-24: 15 mL

## 2022-01-24 MED ORDER — HEPARIN (PORCINE) IN NACL 1000-0.9 UT/500ML-% IV SOLN
INTRAVENOUS | Status: AC
  Filled 2022-01-24: qty 500

## 2022-01-24 MED ORDER — ONDANSETRON HCL 4 MG/2ML IJ SOLN: 4.0000 mg | Freq: Four times a day (QID) | INTRAMUSCULAR | Status: DC | PRN

## 2022-01-24 MED ORDER — SODIUM CHLORIDE 0.9 % IV SOLN: 250.0000 mL | INTRAVENOUS | Status: DC | PRN

## 2022-01-24 MED ORDER — HYDRALAZINE HCL 20 MG/ML IJ SOLN: 5.0000 mg | INTRAMUSCULAR | Status: DC | PRN

## 2022-01-24 MED ORDER — SODIUM CHLORIDE 0.9 % IV SOLN: INTRAVENOUS | Status: DC

## 2022-01-24 MED ORDER — MIDAZOLAM HCL 2 MG/2ML IJ SOLN
INTRAMUSCULAR | Status: AC
  Filled 2022-01-24: qty 2

## 2022-01-24 MED ORDER — ACETAMINOPHEN 325 MG PO TABS
650.0000 mg | ORAL_TABLET | ORAL | Status: DC | PRN
Start: 2022-01-24 — End: 2022-01-24

## 2022-01-24 MED ORDER — FENTANYL CITRATE (PF) 100 MCG/2ML IJ SOLN
INTRAMUSCULAR | Status: DC | PRN
  Administered 2022-01-24: 50 ug via INTRAVENOUS

## 2022-01-24 MED ORDER — FENTANYL CITRATE (PF) 100 MCG/2ML IJ SOLN
INTRAMUSCULAR | Status: AC
  Filled 2022-01-24: qty 2

## 2022-01-24 MED ORDER — SODIUM CHLORIDE 0.9% FLUSH: 3.0000 mL | Freq: Two times a day (BID) | INTRAVENOUS | Status: DC

## 2022-01-24 MED ORDER — IODIXANOL 320 MG/ML IV SOLN
INTRAVENOUS | Status: DC | PRN
  Administered 2022-01-24: 97 mL

## 2022-01-24 MED ORDER — SODIUM CHLORIDE 0.9% FLUSH: 3.0000 mL | INTRAVENOUS | Status: DC | PRN

## 2022-01-24 MED ORDER — MIDAZOLAM HCL 2 MG/2ML IJ SOLN
INTRAMUSCULAR | Status: DC | PRN
  Administered 2022-01-24: 1 mg via INTRAVENOUS

## 2022-01-24 MED ORDER — LIDOCAINE HCL (PF) 1 % IJ SOLN
INTRAMUSCULAR | Status: AC
  Filled 2022-01-24: qty 30

## 2022-01-24 MED ORDER — LABETALOL HCL 5 MG/ML IV SOLN: 10.0000 mg | INTRAVENOUS | Status: DC | PRN

## 2022-01-24 SURGICAL SUPPLY — 10 items
CATH CROSS OVER TEMPO 5F (CATHETERS) IMPLANT
CATH OMNI FLUSH 5F 65CM (CATHETERS) IMPLANT
DEVICE CLOSURE MYNXGRIP 5F (Vascular Products) IMPLANT
KIT MICROPUNCTURE NIT STIFF (SHEATH) IMPLANT
KIT PV (KITS) ×2 IMPLANT
SHEATH PINNACLE 5F 10CM (SHEATH) IMPLANT
SYR MEDRAD MARK V 150ML (SYRINGE) IMPLANT
TRANSDUCER W/STOPCOCK (MISCELLANEOUS) ×2 IMPLANT
TRAY PV CATH (CUSTOM PROCEDURE TRAY) ×2 IMPLANT
WIRE BENTSON .035X145CM (WIRE) IMPLANT

## 2022-01-24 NOTE — Op Note (Signed)
    Patient name: Roger Stewart MRN: [id number] DOB: May 01, 1946 Sex: male  01/24/2022 Pre-operative Diagnosis: Lifestyle limiting claudication right lower extremity Post-operative diagnosis:  Same Surgeon:  Marty Heck, MD Procedure Performed: 1.  Ultrasound-guided access left common femoral artery 2.  Aortogram with catheter selection of aorta 3.  Right lower extremity arteriogram with selection of second-order branches 4.  Mynx closure of the left common femoral artery 5.  30 minutes of monitored moderate conscious sedation time  Indications: 76 year old male previously followed for right lower extremity claudication.  This has become more disabling and limits his activities.  He presents today for aortogram, lower extremity arteriogram, possible intervention with a focus on the right leg.  Findings:   Aortogram showed no flow-limiting stenosis in the aortoiliac segment.  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  Right lower extremity runoff showed a patent common femoral and profunda with a flush SFA occlusion.  He reconstitutes an [place] of SFA distally and then he has an above-knee popliteal occlusion.  Has a below-knee popliteal target with three-vessel runoff.   Procedure:  The patient was identified in the holding area and taken to room 8.  The patient was then placed supine on the table and prepped and draped in the usual sterile fashion.  A time out was called.  Ultrasound was used to evaluate the left common femoral artery.  It was patent .  A digital ultrasound image was acquired.  A micropuncture needle was used to access the left common femoral artery under ultrasound guidance.  An 018 wire was advanced without resistance and a micropuncture sheath was placed.  The 018 wire was removed and a benson wire was placed.  The micropuncture sheath was exchanged for a 5 french sheath.  An  omniflush catheter was advanced over the wire to the level of L-1.  An abdominal angiogram was obtained.  Next, using the omniflush catheter and a benson wire, the aortic bifurcation was crossed and the catheter was placed into theright external iliac artery and right runoff was obtained.  Pertinent findings are noted above.  Patient will need vein mapping for right leg bypass.  Wires and catheters removed.  Mynx closure deployed in left common femoral artery.  Plan: We will obtain vein mapping today.  Patient is debating right femoropopliteal bypass.  I will have a phone visit next week to further discuss.      Marty Heck, MD [facility] [place] Office: [phone]

## 2022-01-24 NOTE — H&P (Signed)
History and Physical Interval Note:  01/24/2022 12:04 PM  Roger Stewart  has presented today for surgery, with the diagnosis of pvd w/ claudication.  The various methods of treatment have been discussed with the patient and family. After consideration of risks, benefits and other options for treatment, the patient has consented to  Procedure(s): ABDOMINAL AORTOGRAM W/LOWER EXTREMITY (N/A) as a surgical intervention.  The patient's history has been reviewed, patient examined, no change in status, stable for surgery.  I have reviewed the patient's chart and labs.  Questions were answered to the patient's satisfaction.     Marty Heck  Patient name: Roger Stewart MRN: [id number]        DOB: 04-Sep-1945          Sex: male   REASON FOR CONSULT: 1 year follow-up for right lower extremity claudication   HPI: Roger Stewart is a 76 y.o. male, with history of hypertension and hyperlipidemia that presents for 1 year follow-up of claudication in his right leg.  He was initially seen with burning in the right calf after walking about 5 to 10 minutes.  He spends about half his year in [place].  Sounds like he is still able to play golf but cannot play tennis or pickle ball.  States he can walk about a quarter of a mile depending on his pace.  No rest pain or tissue loss.       Past Medical History:  Diagnosis Date   Cancer ([place])      Basal Cell X 8; Dr [place] Crane   Colon polyps      Dr Amedeo Plenty   Diverticulosis of colon     Hyperlipidemia      elevated TG   Hypertension     Sciatica             Past Surgical History:  Procedure Laterality Date   ABDOMINAL AORTIC ANEURYSM REPAIR   2005   colonoscopy with polypectomy   2010;2016    Dr Amedeo Plenty   [facility] X3        LS spine   LUMBAR SPINE SURGERY   04/2010    spur & bulging disc L 4-5, [place] , [place] SURGERY   08/2010    cyst resected @ op site           Family History  Problem Relation Age of Onset   Heart disease Mother           CHF ,CABG   Prostate cancer Father     Hypertension Father     Transient ischemic attack Father          in 55s   Hypertension Brother     Acromegaly Brother     Prostate cancer Brother     Leukemia Brother     Diabetes Neg Hx        SOCIAL HISTORY: Social History         Socioeconomic History   Marital status: Legally Separated      Spouse name: Not on file   Number of children: 1   Years of education: 16   Highest education level: Not on file  Occupational History   Not on file  Tobacco Use   Smoking status: Every Day      Types: Cigars      Last attempt to quit: 06/10/2000      Years since quitting: 21.5   Smokeless tobacco: Never   Tobacco comments:  Smoked [phone], up to 3/4 ppd. 05/22/15 1 cigar a day. 10/26/19 2-4 cigars daily  Vaping Use   Vaping Use: Never used  Substance and Sexual Activity   Alcohol use: Yes      Alcohol/week: 16.0 standard drinks of alcohol      Types: 2 Glasses of wine, 14 Cans of beer per week   Drug use: No   Sexual activity: Not on file  Other Topics Concern   Not on file  Social History Narrative    Denies abuse and feels safe at home.     Social Determinants of Health        Financial Resource Strain: Low Risk  (06/06/2021)    Overall Financial Resource Strain (CARDIA)     Difficulty of Paying Living Expenses: Not hard at all  Food Insecurity: No Food Insecurity (06/06/2021)    Hunger Vital Sign     Worried About Running Out of Food in the Last Year: Never true     Ran Out of Food in the Last Year: Never true  Transportation Needs: No Transportation Needs (06/06/2021)    PRAPARE - Armed forces logistics/support/administrative officer (Medical): No     Lack of Transportation (Non-Medical): No  Physical Activity: Inactive (06/06/2021)    Exercise Vital Sign     Days of Exercise per Week: 0 days     Minutes of Exercise per Session: 0 min  Stress: No Stress Concern Present (06/06/2021)    [place]     Feeling of Stress : Not at all  Social Connections: Moderately Isolated (06/06/2021)    Social Connection and Isolation Panel [NHANES]     Frequency of Communication with Friends and Family: Twice a week     Frequency of Social Gatherings with Friends and Family: Twice a week     Attends Religious Services: Never     Marine scientist or Organizations: Yes     Attends Music therapist: More than 4 times per year     Marital Status: Separated  Intimate Partner Violence: Not At Risk (06/06/2021)    Humiliation, Afraid, Rape, and Kick questionnaire     Fear of Current or Ex-Partner: No     Emotionally Abused: No     Physically Abused: No     Sexually Abused: No           Allergies  Allergen Reactions   Norvasc [Amlodipine Besylate] Other (See Comments)      Body aches            Current Outpatient Medications  Medication Sig Dispense Refill   allopurinol (ZYLOPRIM) 100 MG tablet Take 100 mg by mouth daily.       atorvastatin (LIPITOR) 20 MG tablet TAKE 1 TABLET BY MOUTH EVERY DAY NEEDS APPOINTMENT FOR ADDITIONAL REFILLS 90 tablet 3   cilostazol (PLETAL) 100 MG tablet TAKE 1 TABLET BY MOUTH TWICE A DAY 180 tablet 1   indomethacin (INDOCIN) 50 MG capsule Take 50 mg by mouth 2 (two) times daily as needed.       metoprolol tartrate (LOPRESSOR) 25 MG tablet TAKE 1 TABLET BY MOUTH EVERY DAY 90 tablet 2   lisinopril (ZESTRIL) 20 MG tablet TAKE 1 TABLET BY MOUTH EVERY DAY. **NEEDS APPT BEFORE ANYMORE REFILLS GIVEN** 90 tablet 3    No current facility-administered medications for this visit.      REVIEW OF SYSTEMS:  '[X]'$  denotes positive  finding, '[ ]'$  denotes negative finding Cardiac   Comments:  Chest pain or chest pressure:      Shortness of breath upon exertion:      Short of breath when lying flat:      Irregular heart rhythm:             Vascular      Pain in calf, thigh, or hip brought on by ambulation: x Right  Pain in  feet at night that wakes you up from your sleep:       Blood clot in your veins:      Leg swelling:              Pulmonary      Oxygen at home:      Productive cough:       Wheezing:              Neurologic      Sudden weakness in arms or legs:       Sudden numbness in arms or legs:       Sudden onset of difficulty speaking or slurred speech:      Temporary loss of vision in one eye:       Problems with dizziness:              Gastrointestinal      Blood in stool:       Vomited blood:              Genitourinary      Burning when urinating:       Blood in urine:             Psychiatric      Major depression:              Hematologic      Bleeding problems:      Problems with blood clotting too easily:             Skin      Rashes or ulcers:             Constitutional      Fever or chills:          PHYSICAL EXAM:    Vitals:    01/08/22 1206  BP: (!) 151/93  Pulse: 69  Resp: 18  Temp: 97.9 F (36.6 C)  TempSrc: Temporal  SpO2: 94%  Weight: 191 lb (86.6 kg)  Height: 6' (1.829 m)      GENERAL: The patient is a well-nourished male, in no acute distress. The vital signs are documented above. CARDIAC: There is a regular rate and rhythm.  VASCULAR:  2+ palpable femoral pulse bilaterally Left dorsalis pedis and posterior tibial pulse palpable No palpable right pedal pulses PULMONARY: No respiratory  ABDOMEN: Soft and non-tender. MUSCULOSKELETAL: There are no major deformities or cyanosis. NEUROLOGIC: No focal weakness or paresthesias are detected. SKIN: There are no ulcers or rashes noted. PSYCHIATRIC: The patient has a normal affect.   DATA:    ABIs are 0.64 on the right monophasic (previously 0.8) and 1.01 on the left triphasic   Assessment/Plan:   76 year old male presents for follow-up of his right lower extremity claudication.   Sounds like he is still able to play golf but is severely limited when doing more active sports like tennis and pickleball.   I discussed that we should reserve intervention for claudication when it becomes lifestyle limiting.  Discussed he has no signs of rest pain or  tissue loss to suggest critical limb ischemia that would be a much more urgent need for intervention.  I did discuss basic steps of transfemoral access with aortogram and lower extreme arteriogram and possible endovascular intervention with a focus on the right leg.  He is going to think about his options and may contact us to schedule intervention.  Otherwise I will set up a follow-up in 1 year with ABIs.   Marty Heck, MD [facility] [place] Office: [phone]

## 2022-01-24 NOTE — Progress Notes (Signed)
Bilateral LE vein mapping study completed. Please see CV Proc for preliminary results.  Anderson Malta  Vu Liebman BS, RVT 01/24/2022 2:48 PM

## 2022-01-24 NOTE — Telephone Encounter (Signed)
-----   Message from Marty Heck, MD sent at 01/24/2022  2:16 PM EDT ----- Patient name: Roger Stewart MRN: [id number]        DOB: January 07, 1946          Sex: male  01/24/2022 Pre-operative Diagnosis: Lifestyle limiting claudication right lower extremity Post-operative diagnosis:  Same Surgeon:  Marty Heck, MD Procedure Performed: 1.  Ultrasound-guided access left common femoral artery 2.  Aortogram with catheter selection of aorta 3.  Right lower extremity arteriogram with selection of second-order branches 4.  Mynx closure of the left common femoral artery 5.  30 minutes of monitored moderate conscious sedation time  #Can you schedule phone visit next Tuesday with me to discuss right leg fem pop bypass?  Thanks,  Gerald Stabs

## 2022-01-25 ENCOUNTER — Encounter (HOSPITAL_COMMUNITY): Payer: Self-pay | Admitting: Vascular Surgery

## 2022-01-29 ENCOUNTER — Telehealth: Payer: Self-pay

## 2022-01-29 ENCOUNTER — Ambulatory Visit (INDEPENDENT_AMBULATORY_CARE_PROVIDER_SITE_OTHER): Payer: Medicare HMO | Admitting: Vascular Surgery

## 2022-01-29 ENCOUNTER — Encounter: Payer: Self-pay | Admitting: Vascular Surgery

## 2022-01-29 VITALS — Ht 72.0 in | Wt 192.0 lb

## 2022-01-29 DIAGNOSIS — I70211 Atherosclerosis of native arteries of extremities with intermittent claudication, right leg: Secondary | ICD-10-CM | POA: Diagnosis not present

## 2022-01-29 NOTE — Progress Notes (Signed)
Virtual Visit via Telephone Note    I connected with Roger Stewart on 01/29/2022 using the [employer] by telephone and verified that I was speaking with the correct person using two identifiers. Patient was located at home. I am located at [facility] office.   The limitations of evaluation and management by telemedicine and the availability of in person appointments have been previously discussed with the patient and are documented in the patients chart. The patient expressed understanding and consented to proceed.  PCP: Libby Maw, MD  Chief Complaint: Discuss right leg femoral to popliteal artery bypass for disabling claudication  History of Present Illness: Roger Stewart is a 76 y.o. male with history of hypertension hyperlipidemia followed for claudication of the right leg.  Ultimately his claudication become more disabling.  He underwent angiogram on 01/24/2022 showing a flush SFA occlusion on the right.  I offered him a right common femoral to below-knee popliteal bypass and he presents to further discuss.  Vein mapping was obtained in the hospital.  Past Medical History:  Diagnosis Date   Cancer ([place])    Basal Cell X 8; Dr Nevada Crane   Colon polyps    Dr Amedeo Plenty   Diverticulosis of colon    Hyperlipidemia    elevated TG   Hypertension    Sciatica     Past Surgical History:  Procedure Laterality Date   ABDOMINAL AORTIC ANEURYSM REPAIR  2005   ABDOMINAL AORTOGRAM W/LOWER EXTREMITY Right 01/24/2022   Procedure: ABDOMINAL AORTOGRAM W/LOWER EXTREMITY;  Surgeon: Marty Heck, MD;  Location: [facility];  Service: Cardiovascular;  Laterality: Right;   colonoscopy with polypectomy  2010;2016   Dr Amedeo Plenty   [facility] X[facility]     LS spine   LUMBAR SPINE SURGERY  04/2010   spur & bulging disc L 4-5, [place] , [place] SURGERY  08/2010   cyst resected @ op site    Current Meds  Medication Sig   allopurinol (ZYLOPRIM) 100 MG tablet Take 100 mg by mouth daily as needed  (gout flares.).   atorvastatin (LIPITOR) 20 MG tablet TAKE 1 TABLET BY MOUTH EVERY DAY NEEDS APPOINTMENT FOR ADDITIONAL REFILLS   cetirizine (ZYRTEC) 10 MG tablet Take 10 mg by mouth daily as needed for allergies.   cilostazol (PLETAL) 100 MG tablet TAKE 1 TABLET BY MOUTH TWICE A DAY   Coenzyme Q10-Vitamin E (QUNOL ULTRA COQ10 PO) Take 1 capsule by mouth in the morning.   Ibuprofen-Acetaminophen 125-250 MG TABS Take 1 tablet by mouth daily as needed (pain.).   indomethacin (INDOCIN) 50 MG capsule Take 50 mg by mouth 2 (two) times daily as needed (gout flares.).   lisinopril (ZESTRIL) 20 MG tablet TAKE 1 TABLET BY MOUTH EVERY DAY. **NEEDS APPT BEFORE ANYMORE REFILLS GIVEN**   metoprolol tartrate (LOPRESSOR) 25 MG tablet TAKE 1 TABLET BY MOUTH EVERY DAY   Misc Natural Products (BRAINSTRONG MEMORY SUPPORT) TABS Take 1 tablet by mouth daily.   Misc Natural Products (PROSTATE SUPPORT PO) Take 1 capsule by mouth in the morning.   Multiple Vitamin (MULTIVITAMIN WITH MINERALS) TABS tablet Take 1 tablet by mouth daily.   OVER THE COUNTER MEDICATION Take 1 capsule by mouth in the morning. Nitric Oxide Supplement   Polyethyl Glycol-Propyl Glycol (LUBRICANT EYE DROPS) 0.4-0.3 % SOLN Place 1-2 drops into both eyes 3 (three) times daily as needed (dry/irritated eyes.).    12 system ROS was negative unless otherwise noted in HPI  Observations/Objective:  Angiogram images reviewed from 01/24/2022 showing a flush right SFA occlusion with reconstitution of an [place] of SFA distally and then an above-knee popliteal occlusion and then a below-knee target with three-vessel runoff.  Assessment and Plan:  76 year old male with disabling claudication in the right leg.  I reviewed his arteriogram images with him and discussed he has a flush SFA occlusion on the right that makes endovascular therapy with stenting not a good option.  I have offered him a right common femoral to below-knee popliteal bypass with vein.   Vein mapping does show a usable great saphenous vein.  We discussed basic steps of the operation as well as 3 to 4 days in the hospital and up to a 4-week recovery.  He is leaning toward having surgery in the spring when he is back from [place].  I will tentatively arrange a 20-monthfollow-up with me.  He will contact our office if he changes his mind on timing.   Follow Up Instructions:   Follow up: 6 month follow-up with ABI's   I discussed the assessment and treatment plan with the patient. The patient was provided an opportunity to ask questions and all were answered. The patient agreed with the plan and demonstrated an understanding of the instructions.   The patient was advised to call back or seek an in-person evaluation if the symptoms worsen or if the condition fails to improve as anticipated.  I spent 5 minutes with the patient via telephone encounter.   Signed, CMarty HeckVascular and Vein Specialists of [place]: [place]  01/29/2022, 3:32 PM

## 2022-01-29 NOTE — Telephone Encounter (Signed)
Pt called to ask when he could take his bandage off since he forgot to ask him at his visit today.  Reviewed pt's chart, returned call for clarification, two identifiers used. Informed him that he could remove the bandage now, gave him washing instructions, and care of site instructions. Confirmed understanding.

## 2022-01-30 NOTE — Telephone Encounter (Signed)
Pt has already been seen. Closing encounter.

## 2022-02-04 ENCOUNTER — Other Ambulatory Visit: Payer: Self-pay

## 2022-02-04 DIAGNOSIS — I739 Peripheral vascular disease, unspecified: Secondary | ICD-10-CM

## 2022-03-26 DIAGNOSIS — M9905 Segmental and somatic dysfunction of pelvic region: Secondary | ICD-10-CM | POA: Diagnosis not present

## 2022-03-26 DIAGNOSIS — M62830 Muscle spasm of back: Secondary | ICD-10-CM | POA: Diagnosis not present

## 2022-03-26 DIAGNOSIS — M5451 Vertebrogenic low back pain: Secondary | ICD-10-CM | POA: Diagnosis not present

## 2022-03-26 DIAGNOSIS — M9904 Segmental and somatic dysfunction of sacral region: Secondary | ICD-10-CM | POA: Diagnosis not present

## 2022-03-26 DIAGNOSIS — M9903 Segmental and somatic dysfunction of lumbar region: Secondary | ICD-10-CM | POA: Diagnosis not present

## 2022-04-01 DIAGNOSIS — M62830 Muscle spasm of back: Secondary | ICD-10-CM | POA: Diagnosis not present

## 2022-04-01 DIAGNOSIS — M9904 Segmental and somatic dysfunction of sacral region: Secondary | ICD-10-CM | POA: Diagnosis not present

## 2022-04-01 DIAGNOSIS — M9903 Segmental and somatic dysfunction of lumbar region: Secondary | ICD-10-CM | POA: Diagnosis not present

## 2022-04-01 DIAGNOSIS — M9905 Segmental and somatic dysfunction of pelvic region: Secondary | ICD-10-CM | POA: Diagnosis not present

## 2022-04-01 DIAGNOSIS — M5451 Vertebrogenic low back pain: Secondary | ICD-10-CM | POA: Diagnosis not present

## 2022-04-08 DIAGNOSIS — M9905 Segmental and somatic dysfunction of pelvic region: Secondary | ICD-10-CM | POA: Diagnosis not present

## 2022-04-08 DIAGNOSIS — Z85828 Personal history of other malignant neoplasm of skin: Secondary | ICD-10-CM | POA: Diagnosis not present

## 2022-04-08 DIAGNOSIS — Z08 Encounter for follow-up examination after completed treatment for malignant neoplasm: Secondary | ICD-10-CM | POA: Diagnosis not present

## 2022-04-08 DIAGNOSIS — M9904 Segmental and somatic dysfunction of sacral region: Secondary | ICD-10-CM | POA: Diagnosis not present

## 2022-04-08 DIAGNOSIS — M62830 Muscle spasm of back: Secondary | ICD-10-CM | POA: Diagnosis not present

## 2022-04-08 DIAGNOSIS — M9903 Segmental and somatic dysfunction of lumbar region: Secondary | ICD-10-CM | POA: Diagnosis not present

## 2022-04-08 DIAGNOSIS — D1801 Hemangioma of skin and subcutaneous tissue: Secondary | ICD-10-CM | POA: Diagnosis not present

## 2022-04-08 DIAGNOSIS — C44399 Other specified malignant neoplasm of skin of other parts of face: Secondary | ICD-10-CM | POA: Diagnosis not present

## 2022-04-08 DIAGNOSIS — M5451 Vertebrogenic low back pain: Secondary | ICD-10-CM | POA: Diagnosis not present

## 2022-04-08 DIAGNOSIS — L821 Other seborrheic keratosis: Secondary | ICD-10-CM | POA: Diagnosis not present

## 2022-04-08 DIAGNOSIS — L570 Actinic keratosis: Secondary | ICD-10-CM | POA: Diagnosis not present

## 2022-04-08 DIAGNOSIS — Z7189 Other specified counseling: Secondary | ICD-10-CM | POA: Diagnosis not present

## 2022-04-08 DIAGNOSIS — L578 Other skin changes due to chronic exposure to nonionizing radiation: Secondary | ICD-10-CM | POA: Diagnosis not present

## 2022-04-08 DIAGNOSIS — C44319 Basal cell carcinoma of skin of other parts of face: Secondary | ICD-10-CM | POA: Diagnosis not present

## 2022-04-15 DIAGNOSIS — M9903 Segmental and somatic dysfunction of lumbar region: Secondary | ICD-10-CM | POA: Diagnosis not present

## 2022-04-15 DIAGNOSIS — M9904 Segmental and somatic dysfunction of sacral region: Secondary | ICD-10-CM | POA: Diagnosis not present

## 2022-04-15 DIAGNOSIS — M62830 Muscle spasm of back: Secondary | ICD-10-CM | POA: Diagnosis not present

## 2022-04-15 DIAGNOSIS — M9905 Segmental and somatic dysfunction of pelvic region: Secondary | ICD-10-CM | POA: Diagnosis not present

## 2022-04-15 DIAGNOSIS — M5451 Vertebrogenic low back pain: Secondary | ICD-10-CM | POA: Diagnosis not present

## 2022-04-22 DIAGNOSIS — M5451 Vertebrogenic low back pain: Secondary | ICD-10-CM | POA: Diagnosis not present

## 2022-04-22 DIAGNOSIS — M9904 Segmental and somatic dysfunction of sacral region: Secondary | ICD-10-CM | POA: Diagnosis not present

## 2022-04-22 DIAGNOSIS — M9903 Segmental and somatic dysfunction of lumbar region: Secondary | ICD-10-CM | POA: Diagnosis not present

## 2022-04-22 DIAGNOSIS — M9905 Segmental and somatic dysfunction of pelvic region: Secondary | ICD-10-CM | POA: Diagnosis not present

## 2022-04-22 DIAGNOSIS — M62830 Muscle spasm of back: Secondary | ICD-10-CM | POA: Diagnosis not present

## 2022-06-11 ENCOUNTER — Other Ambulatory Visit: Payer: Self-pay | Admitting: Family Medicine

## 2022-06-11 DIAGNOSIS — I739 Peripheral vascular disease, unspecified: Secondary | ICD-10-CM

## 2022-06-13 DIAGNOSIS — J984 Other disorders of lung: Secondary | ICD-10-CM | POA: Diagnosis not present

## 2022-06-13 DIAGNOSIS — I10 Essential (primary) hypertension: Secondary | ICD-10-CM | POA: Diagnosis not present

## 2022-06-13 DIAGNOSIS — R059 Cough, unspecified: Secondary | ICD-10-CM | POA: Diagnosis not present

## 2022-06-13 DIAGNOSIS — R918 Other nonspecific abnormal finding of lung field: Secondary | ICD-10-CM | POA: Diagnosis not present

## 2022-06-16 DIAGNOSIS — R002 Palpitations: Secondary | ICD-10-CM | POA: Diagnosis not present

## 2022-06-16 DIAGNOSIS — Z85828 Personal history of other malignant neoplasm of skin: Secondary | ICD-10-CM | POA: Diagnosis not present

## 2022-06-16 DIAGNOSIS — R54 Age-related physical debility: Secondary | ICD-10-CM | POA: Diagnosis not present

## 2022-06-16 DIAGNOSIS — J9811 Atelectasis: Secondary | ICD-10-CM | POA: Diagnosis not present

## 2022-06-16 DIAGNOSIS — M1990 Unspecified osteoarthritis, unspecified site: Secondary | ICD-10-CM | POA: Diagnosis not present

## 2022-06-16 DIAGNOSIS — Z79899 Other long term (current) drug therapy: Secondary | ICD-10-CM | POA: Diagnosis not present

## 2022-06-16 DIAGNOSIS — I10 Essential (primary) hypertension: Secondary | ICD-10-CM | POA: Diagnosis not present

## 2022-06-16 DIAGNOSIS — E1151 Type 2 diabetes mellitus with diabetic peripheral angiopathy without gangrene: Secondary | ICD-10-CM | POA: Diagnosis not present

## 2022-06-16 DIAGNOSIS — J181 Lobar pneumonia, unspecified organism: Secondary | ICD-10-CM | POA: Diagnosis not present

## 2022-06-16 DIAGNOSIS — R918 Other nonspecific abnormal finding of lung field: Secondary | ICD-10-CM | POA: Diagnosis not present

## 2022-06-16 DIAGNOSIS — Z743 Need for continuous supervision: Secondary | ICD-10-CM | POA: Diagnosis not present

## 2022-06-16 DIAGNOSIS — R001 Bradycardia, unspecified: Secondary | ICD-10-CM | POA: Diagnosis not present

## 2022-06-16 DIAGNOSIS — R69 Illness, unspecified: Secondary | ICD-10-CM | POA: Diagnosis not present

## 2022-06-16 DIAGNOSIS — Z7984 Long term (current) use of oral hypoglycemic drugs: Secondary | ICD-10-CM | POA: Diagnosis not present

## 2022-06-16 DIAGNOSIS — C4490 Unspecified malignant neoplasm of skin, unspecified: Secondary | ICD-10-CM | POA: Diagnosis not present

## 2022-06-16 DIAGNOSIS — F1090 Alcohol use, unspecified, uncomplicated: Secondary | ICD-10-CM | POA: Diagnosis not present

## 2022-06-16 DIAGNOSIS — R509 Fever, unspecified: Secondary | ICD-10-CM | POA: Diagnosis not present

## 2022-06-16 DIAGNOSIS — J438 Other emphysema: Secondary | ICD-10-CM | POA: Diagnosis not present

## 2022-06-16 DIAGNOSIS — J440 Chronic obstructive pulmonary disease with acute lower respiratory infection: Secondary | ICD-10-CM | POA: Diagnosis not present

## 2022-06-16 DIAGNOSIS — F17290 Nicotine dependence, other tobacco product, uncomplicated: Secondary | ICD-10-CM | POA: Diagnosis not present

## 2022-06-16 DIAGNOSIS — E1165 Type 2 diabetes mellitus with hyperglycemia: Secondary | ICD-10-CM | POA: Diagnosis not present

## 2022-06-16 DIAGNOSIS — R059 Cough, unspecified: Secondary | ICD-10-CM | POA: Diagnosis not present

## 2022-06-16 DIAGNOSIS — J9601 Acute respiratory failure with hypoxia: Secondary | ICD-10-CM | POA: Diagnosis not present

## 2022-06-16 DIAGNOSIS — J439 Emphysema, unspecified: Secondary | ICD-10-CM | POA: Diagnosis not present

## 2022-06-16 DIAGNOSIS — Z1152 Encounter for screening for COVID-19: Secondary | ICD-10-CM | POA: Diagnosis not present

## 2022-06-16 DIAGNOSIS — J189 Pneumonia, unspecified organism: Secondary | ICD-10-CM | POA: Diagnosis not present

## 2022-06-16 DIAGNOSIS — E119 Type 2 diabetes mellitus without complications: Secondary | ICD-10-CM | POA: Diagnosis not present

## 2022-06-16 DIAGNOSIS — E785 Hyperlipidemia, unspecified: Secondary | ICD-10-CM | POA: Diagnosis not present

## 2022-06-16 DIAGNOSIS — J441 Chronic obstructive pulmonary disease with (acute) exacerbation: Secondary | ICD-10-CM | POA: Diagnosis not present

## 2022-06-21 DIAGNOSIS — E785 Hyperlipidemia, unspecified: Secondary | ICD-10-CM | POA: Diagnosis not present

## 2022-06-21 DIAGNOSIS — Z85828 Personal history of other malignant neoplasm of skin: Secondary | ICD-10-CM | POA: Diagnosis not present

## 2022-06-21 DIAGNOSIS — Z9981 Dependence on supplemental oxygen: Secondary | ICD-10-CM | POA: Diagnosis not present

## 2022-06-21 DIAGNOSIS — M1990 Unspecified osteoarthritis, unspecified site: Secondary | ICD-10-CM | POA: Diagnosis not present

## 2022-06-21 DIAGNOSIS — E1151 Type 2 diabetes mellitus with diabetic peripheral angiopathy without gangrene: Secondary | ICD-10-CM | POA: Diagnosis not present

## 2022-06-21 DIAGNOSIS — J188 Other pneumonia, unspecified organism: Secondary | ICD-10-CM | POA: Diagnosis not present

## 2022-06-21 DIAGNOSIS — Z7952 Long term (current) use of systemic steroids: Secondary | ICD-10-CM | POA: Diagnosis not present

## 2022-06-21 DIAGNOSIS — Z87891 Personal history of nicotine dependence: Secondary | ICD-10-CM | POA: Diagnosis not present

## 2022-06-21 DIAGNOSIS — J189 Pneumonia, unspecified organism: Secondary | ICD-10-CM | POA: Diagnosis not present

## 2022-06-21 DIAGNOSIS — J438 Other emphysema: Secondary | ICD-10-CM | POA: Diagnosis not present

## 2022-06-21 DIAGNOSIS — E1165 Type 2 diabetes mellitus with hyperglycemia: Secondary | ICD-10-CM | POA: Diagnosis not present

## 2022-06-21 DIAGNOSIS — I10 Essential (primary) hypertension: Secondary | ICD-10-CM | POA: Diagnosis not present

## 2022-06-21 DIAGNOSIS — J440 Chronic obstructive pulmonary disease with acute lower respiratory infection: Secondary | ICD-10-CM | POA: Diagnosis not present

## 2022-06-21 DIAGNOSIS — J9601 Acute respiratory failure with hypoxia: Secondary | ICD-10-CM | POA: Diagnosis not present

## 2022-06-28 ENCOUNTER — Ambulatory Visit (INDEPENDENT_AMBULATORY_CARE_PROVIDER_SITE_OTHER): Payer: Medicare HMO

## 2022-06-28 VITALS — Ht 72.0 in | Wt 190.0 lb

## 2022-06-28 DIAGNOSIS — Z0000 Encounter for general adult medical examination without abnormal findings: Secondary | ICD-10-CM | POA: Diagnosis not present

## 2022-06-28 NOTE — Progress Notes (Signed)
I connected with [name] today by telephone and verified that I am speaking with the correct person using two identifiers. Location patient: home Location provider: work Persons participating in the virtual visit: Bertram, Haddix LPN.   I discussed the limitations, risks, security and privacy concerns of performing an evaluation and management service by telephone and the availability of in person appointments. I also discussed with the patient that there may be a patient responsible charge related to this service. The patient expressed understanding and verbally consented to this telephonic visit.    Interactive audio and video telecommunications were attempted between this provider and patient, however failed, due to patient having technical difficulties OR patient did not have access to video capability.  We continued and completed visit with audio only.     Vital signs may be patient reported or missing.  Subjective:   BENEDETTO RYDER is a 77 y.o. male who presents for Medicare Annual/Subsequent preventive examination.  Review of Systems     Cardiac Risk Factors include: advanced age (>36mn, >>59women);smoking/ tobacco exposure;dyslipidemia;hypertension;male gender     Objective:    Today's Vitals   06/28/22 1131  Weight: 190 lb (86.2 kg)  Height: 6' (1.829 m)   Body mass index is 25.77 kg/m.     06/28/2022   11:37 AM 01/24/2022   10:26 AM 06/06/2021   12:54 PM 06/06/2021   12:49 PM 12/29/2019   10:47 AM  Advanced Directives  Does Patient Have a Medical Advance Directive? Yes Yes Yes Yes Yes  Type of AParamedicof ACornwallLiving will HCharlesLiving will HDrainLiving will HBoiling SpringsLiving will HRocky HillLiving will  Does patient want to make changes to medical advance directive?  No - Patient declined     Copy of HLaPlacein Chart?  No - copy requested No - copy requested No - copy requested No - copy requested No - copy requested    Current Medications (verified) Outpatient Encounter Medications as of 06/28/2022  Medication Sig   allopurinol (ZYLOPRIM) 100 MG tablet Take 100 mg by mouth daily as needed (gout flares.).   atorvastatin (LIPITOR) 20 MG tablet TAKE 1 TABLET BY MOUTH EVERY DAY NEEDS APPOINTMENT FOR ADDITIONAL REFILLS   cetirizine (ZYRTEC) 10 MG tablet Take 10 mg by mouth daily as needed for allergies.   cilostazol (PLETAL) 100 MG tablet TAKE 1 TABLET BY MOUTH TWICE A DAY   Ibuprofen-Acetaminophen 125-250 MG TABS Take 1 tablet by mouth daily as needed (pain.).   indomethacin (INDOCIN) 50 MG capsule Take 50 mg by mouth 2 (two) times daily as needed (gout flares.).   metoprolol tartrate (LOPRESSOR) 25 MG tablet TAKE 1 TABLET BY MOUTH EVERY DAY   Misc Natural Products (BRAINSTRONG MEMORY SUPPORT) TABS Take 1 tablet by mouth daily.   Misc Natural Products (PROSTATE SUPPORT PO) Take 1 capsule by mouth in the morning.   Multiple Vitamin (MULTIVITAMIN WITH MINERALS) TABS tablet Take 1 tablet by mouth daily.   OVER THE COUNTER MEDICATION Take 1 capsule by mouth in the morning. Nitric Oxide Supplement   Polyethyl Glycol-Propyl Glycol (LUBRICANT EYE DROPS) 0.4-0.3 % SOLN Place 1-2 drops into both eyes 3 (three) times daily as needed (dry/irritated eyes.).   Coenzyme Q10-Vitamin E (QUNOL ULTRA COQ10 PO) Take 1 capsule by mouth in the morning. (Patient not taking: Reported on 06/28/2022)   lisinopril (ZESTRIL) 20 MG tablet TAKE 1 TABLET BY MOUTH EVERY  DAY. **NEEDS APPT BEFORE ANYMORE REFILLS GIVEN** (Patient not taking: Reported on 06/28/2022)   No facility-administered encounter medications on file as of 06/28/2022.    Allergies (verified) Norvasc [amlodipine besylate]   History: Past Medical History:  Diagnosis Date   Cancer ([place])    Basal Cell X 8; Dr [place] Crane   Colon polyps    Dr Amedeo Plenty   Diverticulosis of colon     Hyperlipidemia    elevated TG   Hypertension    Sciatica    Past Surgical History:  Procedure Laterality Date   ABDOMINAL AORTIC ANEURYSM REPAIR  2005   ABDOMINAL AORTOGRAM W/LOWER EXTREMITY Right 01/24/2022   Procedure: ABDOMINAL AORTOGRAM W/LOWER EXTREMITY;  Surgeon: Marty Heck, MD;  Location: [facility];  Service: Cardiovascular;  Laterality: Right;   colonoscopy with polypectomy  2010;2016   Dr Amedeo Plenty   [facility] X3     LS spine   LUMBAR SPINE SURGERY  04/2010   spur & bulging disc L 4-5, [place] , [place] SURGERY  08/2010   cyst resected @ op site   Family History  Problem Relation Age of Onset   Heart disease Mother        CHF ,CABG   Prostate cancer Father    Hypertension Father    Transient ischemic attack Father        in 68s   Hypertension Brother    Acromegaly Brother    Prostate cancer Brother    Leukemia Brother    Diabetes Neg Hx    Social History   Socioeconomic History   Marital status: Legally Separated    Spouse name: Not on file   Number of children: 1   Years of education: 16   Highest education level: Not on file  Occupational History   Not on file  Tobacco Use   Smoking status: Every Day    Types: Cigars    Last attempt to quit: 06/10/2000    Years since quitting: 22.0   Smokeless tobacco: Never   Tobacco comments:     Smoked [phone], up to 3/4 ppd. 05/22/15 1 cigar a day. 10/26/19 2-4 cigars daily  Vaping Use   Vaping Use: Never used  Substance and Sexual Activity   Alcohol use: Yes    Alcohol/week: 16.0 standard drinks of alcohol    Types: 2 Glasses of wine, 14 Cans of beer per week   Drug use: No   Sexual activity: Not on file  Other Topics Concern   Not on file  Social History Narrative   Denies abuse and feels safe at home.    Social Determinants of Health   Financial Resource Strain: Low Risk  (06/28/2022)   Overall Financial Resource Strain (CARDIA)    Difficulty of Paying Living Expenses: Not hard at  all  Food Insecurity: No Food Insecurity (06/28/2022)   Hunger Vital Sign    Worried About Running Out of Food in the Last Year: Never true    Ran Out of Food in the Last Year: Never true  Transportation Needs: No Transportation Needs (06/28/2022)   PRAPARE - Hydrologist (Medical): No    Lack of Transportation (Non-Medical): No  Physical Activity: Insufficiently Active (06/28/2022)   Exercise Vital Sign    Days of Exercise per Week: 3 days    Minutes of Exercise per Session: 30 min  Stress: No Stress Concern Present (06/28/2022)   [place]  Stress Questionnaire    Feeling of Stress : Not at all  Social Connections: Moderately Isolated (06/06/2021)   Social Connection and Isolation Panel [NHANES]    Frequency of Communication with Friends and Family: Twice a week    Frequency of Social Gatherings with Friends and Family: Twice a week    Attends Religious Services: Never    Marine scientist or Organizations: Yes    Attends Music therapist: More than 4 times per year    Marital Status: Separated    Tobacco Counseling Ready to quit: Not Answered Counseling given: Not Answered Tobacco comments:  Smoked [phone], up to 3/4 ppd. 05/22/15 1 cigar a day. 10/26/19 2-4 cigars daily   Clinical Intake:  Pre-visit preparation completed: Yes  Pain : No/denies pain     Nutritional Status: BMI 25 -29 Overweight Nutritional Risks: None Diabetes: No  How often do you need to have someone help you when you read instructions, pamphlets, or other written materials from your doctor or pharmacy?: 1 - Never  Diabetic? no  Interpreter Needed?: No  Information entered by :: NAllen LPN   Activities of Daily Living    06/28/2022   11:37 AM  In your present state of health, do you have any difficulty performing the following activities:  Hearing? 1  Vision? 0  Difficulty concentrating or making  decisions? 0  Walking or climbing stairs? 0  Dressing or bathing? 0  Doing errands, shopping? 0  Preparing Food and eating ? N  Using the Toilet? N  In the past six months, have you accidently leaked urine? N  Do you have problems with loss of bowel control? N  Managing your Medications? N  Managing your Finances? N  Housekeeping or managing your Housekeeping? N    Patient Care Team: Libby Maw, MD as PCP - General (Family Medicine) Germaine Pomfret, [facility] as Pharmacist (Pharmacist)  Indicate any recent Medical Services you may have received from other than [facility] providers in the past year (date may be approximate).     Assessment:   This is a routine wellness examination for Maxemiliano.  Hearing/Vision screen Vision Screening - Comments:: Regular eye exams,   Dietary issues and exercise activities discussed: Current Exercise Habits: Home exercise routine, Type of exercise: strength training/weights;Other - see comments (stationary bike, vibrating machine), Time (Minutes): 30, Frequency (Times/Week): 3, Weekly Exercise (Minutes/Week): 90   Goals Addressed             This Visit's Progress    Patient Stated       06/28/2022, no goals       Depression Screen    06/28/2022   11:37 AM 10/11/2021   11:13 AM 10/11/2021   10:41 AM 06/06/2021   12:51 PM 06/06/2021   12:48 PM 10/09/2020    4:55 PM 10/09/2020    3:17 PM  PHQ 2/9 Scores  PHQ - 2 Score 0 1 0 0 0 1 0  PHQ- 9 Score  4    1     Fall Risk    06/28/2022   11:37 AM 10/11/2021   10:41 AM 06/06/2021   12:50 PM 06/05/2021    2:24 PM 10/09/2020    3:17 PM  Fall Risk   Falls in the past year? 0 0 0 0 0  Number falls in past yr: 0 0 0 0   Injury with Fall? 0  0 0   Risk for fall due to : Medication side effect  Follow up Falls prevention discussed;Education provided;Falls evaluation completed  Falls evaluation completed      FALL RISK PREVENTION PERTAINING TO THE HOME:  Any stairs in or around the home?  No  If so, are there any without handrails? N/a Home free of loose throw rugs in walkways, pet beds, electrical cords, etc? Yes  Adequate lighting in your home to reduce risk of falls? Yes   ASSISTIVE DEVICES UTILIZED TO PREVENT FALLS:  Life alert? No  Use of a cane, walker or w/c? No  Grab bars in the bathroom? No  Shower chair or bench in shower? No  Elevated toilet seat or a handicapped toilet? No   TIMED UP AND GO:  Was the test performed? No .      Cognitive Function:        06/28/2022   11:39 AM  6CIT Screen  What Year? 0 points  What month? 0 points  What time? 3 points  Count back from 20 2 points  Months in reverse 0 points  Repeat phrase 0 points  Total Score 5 points    Immunizations Immunization History  Administered Date(s) Administered   Fluad Quad(high Dose 65+) 03/18/2019, 03/22/2020   Influenza, High Dose Seasonal PF 05/26/2017   Moderna Sars-Covid-2 Vaccination 08/09/2019, 09/06/2019   Td 06/11/1999, 05/10/2010   Zoster Recombinat (Shingrix) 02/09/2019    TDAP status: Due, Education has been provided regarding the importance of this vaccine. Advised may receive this vaccine at local pharmacy or Health Dept. Aware to provide a copy of the vaccination record if obtained from local pharmacy or Health Dept. Verbalized acceptance and understanding.  Flu Vaccine status: Declined, Education has been provided regarding the importance of this vaccine but patient still declined. Advised may receive this vaccine at local pharmacy or Health Dept. Aware to provide a copy of the vaccination record if obtained from local pharmacy or Health Dept. Verbalized acceptance and understanding.  Pneumococcal vaccine status: Declined,  Education has been provided regarding the importance of this vaccine but patient still declined. Advised may receive this vaccine at local pharmacy or Health Dept. Aware to provide a copy of the vaccination record if obtained from local  pharmacy or Health Dept. Verbalized acceptance and understanding.   Covid-19 vaccine status: Completed vaccines  Qualifies for Shingles Vaccine? Yes   Zostavax completed No   Shingrix Completed?: No.    Education has been provided regarding the importance of this vaccine. Patient has been advised to call insurance company to determine out of pocket expense if they have not yet received this vaccine. Advised may also receive vaccine at local pharmacy or Health Dept. Verbalized acceptance and understanding.  Screening Tests Health Maintenance  Topic Date Due   DTaP/Tdap/Td (3 - Tdap) 05/10/2020   INFLUENZA VACCINE  01/08/2022   Medicare Annual Wellness (AWV)  06/06/2022   Zoster Vaccines- Shingrix (2 of 2) 10/23/2022 (Originally 04/06/2019)   COLONOSCOPY (Pts 45-21yr Insurance coverage will need to be confirmed)  02/09/2025   Hepatitis C Screening  Completed   HPV VACCINES  Aged Out   Pneumonia Vaccine 77 Years old  Discontinued   COVID-19 Vaccine  Discontinued    Health Maintenance  Health Maintenance Due  Topic Date Due   DTaP/Tdap/Td (3 - Tdap) 05/10/2020   INFLUENZA VACCINE  01/08/2022   Medicare Annual Wellness (AWV)  06/06/2022    Colorectal cancer screening: Type of screening: Colonoscopy. Completed 02/10/2020. Repeat every 5 years  Lung Cancer Screening: (Low Dose CT Chest recommended if  Age 48-80 years, 30 pack-year currently smoking OR have quit w/in 15years.) does not qualify.   Lung Cancer Screening Referral: no  Additional Screening:  Hepatitis C Screening: does qualify; Completed 05/23/2016  Vision Screening: Recommended annual ophthalmology exams for early detection of glaucoma and other disorders of the eye. Is the patient up to date with their annual eye exam?  Yes  Who is the provider or what is the name of the office in which the patient attends annual eye exams? Can't remember name If pt is not established with a provider, would they like to be referred  to a provider to establish care? No .   Dental Screening: Recommended annual dental exams for proper oral hygiene  Community Resource Referral / Chronic Care Management: CRR required this visit?  No   CCM required this visit?  No      Plan:     I have personally reviewed and noted the following in the patient's chart:   Medical and social history Use of alcohol, tobacco or illicit drugs  Current medications and supplements including opioid prescriptions. Patient is not currently taking opioid prescriptions. Functional ability and status Nutritional status Physical activity Advanced directives List of other physicians Hospitalizations, surgeries, and ER visits in previous 12 months Vitals Screenings to include cognitive, depression, and falls Referrals and appointments  In addition, I have reviewed and discussed with patient certain preventive protocols, quality metrics, and best practice recommendations. A written personalized care plan for preventive services as well as general preventive health recommendations were provided to patient.     Kellie Simmering, LPN   [id number]   Nurse Notes: none  Due to this being a virtual visit, the after visit summary with patients personalized plan was offered to patient via mail or my-chart. Patient would like to access on my-chart

## 2022-06-28 NOTE — Patient Instructions (Signed)
Roger Stewart , Thank you for taking time to come for your Medicare Wellness Visit. I appreciate your ongoing commitment to your health goals. Please review the following plan we discussed and let me know if I can assist you in the future.   These are the goals we discussed:  Goals      Patient Stated     Would like to start exercising more     Patient Stated     06/28/2022, no goals     Track and Manage My Blood Pressure-Hypertension     Timeframe:  Long-Range Goal Priority:  High Start Date: 11/27/2020                             Expected End Date: 11/27/2021                      Follow Up within 90 days    - check blood pressure weekly - write blood pressure results in a log or diary    Why is this important?   You won't feel high blood pressure, but it can still hurt your blood vessels.  High blood pressure can cause heart or kidney problems. It can also cause a stroke.  Making lifestyle changes like losing a little weight or eating less salt will help.  Checking your blood pressure at home and at different times of the day can help to control blood pressure.  If the doctor prescribes medicine remember to take it the way the doctor ordered.  Call the office if you cannot afford the medicine or if there are questions about it.     Notes:         This is a list of the screening recommended for you and due dates:  Health Maintenance  Topic Date Due   DTaP/Tdap/Td vaccine (3 - Tdap) 05/10/2020   Flu Shot  01/08/2022   Zoster (Shingles) Vaccine (2 of 2) 10/23/2022*   Medicare Annual Wellness Visit  06/29/2023   Colon Cancer Screening  02/09/2025   Hepatitis C Screening: USPSTF Recommendation to screen - Ages 18-79 yo.  Completed   HPV Vaccine  Aged Out   Pneumonia Vaccine  Discontinued   COVID-19 Vaccine  Discontinued  *Topic was postponed. The date shown is not the original due date.    Advanced directives: Please bring a copy of your POA (Power of Attorney) and/or Living  Will to your next appointment.   Conditions/risks identified: smoking  Next appointment: Follow up in one year for your annual wellness visit.   Preventive Care 78 Years and Older, Male  Preventive care refers to lifestyle choices and visits with your health care provider that can promote health and wellness. What does preventive care include? A yearly physical exam. This is also called an annual well check. Dental exams once or twice a year. Routine eye exams. Ask your health care provider how often you should have your eyes checked. Personal lifestyle choices, including: Daily care of your teeth and gums. Regular physical activity. Eating a healthy diet. Avoiding tobacco and drug use. Limiting alcohol use. Practicing safe sex. Taking low doses of aspirin every day. Taking vitamin and mineral supplements as recommended by your health care provider. What happens during an annual well check? The services and screenings done by your health care provider during your annual well check will depend on your age, overall health, lifestyle risk factors, and family history  of disease. Counseling  Your health care provider may ask you questions about your: Alcohol use. Tobacco use. Drug use. Emotional well-being. Home and relationship well-being. Sexual activity. Eating habits. History of falls. Memory and ability to understand (cognition). Work and work Statistician. Screening  You may have the following tests or measurements: Height, weight, and BMI. Blood pressure. Lipid and cholesterol levels. These may be checked every 5 years, or more frequently if you are over 68 years old. Skin check. Lung cancer screening. You may have this screening every year starting at age 40 if you have a 30-pack-year history of smoking and currently smoke or have quit within the past 15 years. Fecal occult blood test (FOBT) of the stool. You may have this test every year starting at age 15. Flexible  sigmoidoscopy or colonoscopy. You may have a sigmoidoscopy every 5 years or a colonoscopy every 10 years starting at age 63. Prostate cancer screening. Recommendations will vary depending on your family history and other risks. Hepatitis C blood test. Hepatitis B blood test. Sexually transmitted disease (STD) testing. Diabetes screening. This is done by checking your blood sugar (glucose) after you have not eaten for a while (fasting). You may have this done every 1-3 years. Abdominal aortic aneurysm (AAA) screening. You may need this if you are a current or former smoker. Osteoporosis. You may be screened starting at age 44 if you are at high risk. Talk with your health care provider about your test results, treatment options, and if necessary, the need for more tests. Vaccines  Your health care provider may recommend certain vaccines, such as: Influenza vaccine. This is recommended every year. Tetanus, diphtheria, and acellular pertussis (Tdap, Td) vaccine. You may need a Td booster every 10 years. Zoster vaccine. You may need this after age 16. Pneumococcal 13-valent conjugate (PCV13) vaccine. One dose is recommended after age 15. Pneumococcal polysaccharide (PPSV23) vaccine. One dose is recommended after age 36. Talk to your health care provider about which screenings and vaccines you need and how often you need them. This information is not intended to replace advice given to you by your health care provider. Make sure you discuss any questions you have with your health care provider. Document Released: 06/23/2015 Document Revised: 02/14/2016 Document Reviewed: 03/28/2015 Elsevier Interactive Patient Education  2017 [employer] Prevention in the Home Falls can cause injuries. They can happen to people of all ages. There are many things you can do to make your home safe and to help prevent falls. What can I do on the outside of my home? Regularly fix the edges of walkways and  driveways and fix any cracks. Remove anything that might make you trip as you walk through a door, such as a raised step or threshold. Trim any bushes or trees on the path to your home. Use bright outdoor lighting. Clear any walking paths of anything that might make someone trip, such as rocks or tools. Regularly check to see if handrails are loose or broken. Make sure that both sides of any steps have handrails. Any raised decks and porches should have guardrails on the edges. Have any leaves, snow, or ice cleared regularly. Use sand or salt on walking paths during winter. Clean up any spills in your garage right away. This includes oil or grease spills. What can I do in the bathroom? Use night lights. Install grab bars by the toilet and in the tub and shower. Do not use towel bars as grab bars. Use  non-skid mats or decals in the tub or shower. If you need to sit down in the shower, use a plastic, non-slip stool. Keep the floor dry. Clean up any water that spills on the floor as soon as it happens. Remove soap buildup in the tub or shower regularly. Attach bath mats securely with double-sided non-slip rug tape. Do not have throw rugs and other things on the floor that can make you trip. What can I do in the bedroom? Use night lights. Make sure that you have a light by your bed that is easy to reach. Do not use any sheets or blankets that are too big for your bed. They should not hang down onto the floor. Have a firm chair that has side arms. You can use this for support while you get dressed. Do not have throw rugs and other things on the floor that can make you trip. What can I do in the kitchen? Clean up any spills right away. Avoid walking on wet floors. Keep items that you use a lot in easy-to-reach places. If you need to reach something above you, use a strong step stool that has a grab bar. Keep electrical cords out of the way. Do not use floor polish or wax that makes floors  slippery. If you must use wax, use non-skid floor wax. Do not have throw rugs and other things on the floor that can make you trip. What can I do with my stairs? Do not leave any items on the stairs. Make sure that there are handrails on both sides of the stairs and use them. Fix handrails that are broken or loose. Make sure that handrails are as long as the stairways. Check any carpeting to make sure that it is firmly attached to the stairs. Fix any carpet that is loose or worn. Avoid having throw rugs at the top or bottom of the stairs. If you do have throw rugs, attach them to the floor with carpet tape. Make sure that you have a light switch at the top of the stairs and the bottom of the stairs. If you do not have them, ask someone to add them for you. What else can I do to help prevent falls? Wear shoes that: Do not have high heels. Have rubber bottoms. Are comfortable and fit you well. Are closed at the toe. Do not wear sandals. If you use a stepladder: Make sure that it is fully opened. Do not climb a closed stepladder. Make sure that both sides of the stepladder are locked into place. Ask someone to hold it for you, if possible. Clearly mark and make sure that you can see: Any grab bars or handrails. First and last steps. Where the edge of each step is. Use tools that help you move around (mobility aids) if they are needed. These include: Canes. Walkers. Scooters. Crutches. Turn on the lights when you go into a dark area. Replace any light bulbs as soon as they burn out. Set up your furniture so you have a clear path. Avoid moving your furniture around. If any of your floors are uneven, fix them. If there are any pets around you, be aware of where they are. Review your medicines with your doctor. Some medicines can make you feel dizzy. This can increase your chance of falling. Ask your doctor what other things that you can do to help prevent falls. This information is not  intended to replace advice given to you by your health care  provider. Make sure you discuss any questions you have with your health care provider. Document Released: 03/23/2009 Document Revised: 11/02/2015 Document Reviewed: 07/01/2014 Elsevier Interactive Patient Education  2017 [employer].

## 2022-07-04 DIAGNOSIS — J439 Emphysema, unspecified: Secondary | ICD-10-CM | POA: Diagnosis not present

## 2022-07-04 DIAGNOSIS — Z8679 Personal history of other diseases of the circulatory system: Secondary | ICD-10-CM | POA: Diagnosis not present

## 2022-07-04 DIAGNOSIS — Z792 Long term (current) use of antibiotics: Secondary | ICD-10-CM | POA: Diagnosis not present

## 2022-07-04 DIAGNOSIS — J189 Pneumonia, unspecified organism: Secondary | ICD-10-CM | POA: Diagnosis not present

## 2022-07-04 DIAGNOSIS — E1151 Type 2 diabetes mellitus with diabetic peripheral angiopathy without gangrene: Secondary | ICD-10-CM | POA: Diagnosis not present

## 2022-07-04 DIAGNOSIS — Z79899 Other long term (current) drug therapy: Secondary | ICD-10-CM | POA: Diagnosis not present

## 2022-07-04 DIAGNOSIS — E785 Hyperlipidemia, unspecified: Secondary | ICD-10-CM | POA: Diagnosis not present

## 2022-07-04 DIAGNOSIS — I10 Essential (primary) hypertension: Secondary | ICD-10-CM | POA: Diagnosis not present

## 2022-07-04 DIAGNOSIS — Z8701 Personal history of pneumonia (recurrent): Secondary | ICD-10-CM | POA: Diagnosis not present

## 2022-07-05 DIAGNOSIS — Z85828 Personal history of other malignant neoplasm of skin: Secondary | ICD-10-CM | POA: Diagnosis not present

## 2022-07-05 DIAGNOSIS — I10 Essential (primary) hypertension: Secondary | ICD-10-CM | POA: Diagnosis not present

## 2022-07-05 DIAGNOSIS — Z87891 Personal history of nicotine dependence: Secondary | ICD-10-CM | POA: Diagnosis not present

## 2022-07-05 DIAGNOSIS — Z9981 Dependence on supplemental oxygen: Secondary | ICD-10-CM | POA: Diagnosis not present

## 2022-07-05 DIAGNOSIS — J188 Other pneumonia, unspecified organism: Secondary | ICD-10-CM | POA: Diagnosis not present

## 2022-07-05 DIAGNOSIS — E1165 Type 2 diabetes mellitus with hyperglycemia: Secondary | ICD-10-CM | POA: Diagnosis not present

## 2022-07-05 DIAGNOSIS — M1990 Unspecified osteoarthritis, unspecified site: Secondary | ICD-10-CM | POA: Diagnosis not present

## 2022-07-05 DIAGNOSIS — E1151 Type 2 diabetes mellitus with diabetic peripheral angiopathy without gangrene: Secondary | ICD-10-CM | POA: Diagnosis not present

## 2022-07-05 DIAGNOSIS — J9601 Acute respiratory failure with hypoxia: Secondary | ICD-10-CM | POA: Diagnosis not present

## 2022-07-05 DIAGNOSIS — Z7952 Long term (current) use of systemic steroids: Secondary | ICD-10-CM | POA: Diagnosis not present

## 2022-07-05 DIAGNOSIS — J438 Other emphysema: Secondary | ICD-10-CM | POA: Diagnosis not present

## 2022-07-05 DIAGNOSIS — J440 Chronic obstructive pulmonary disease with acute lower respiratory infection: Secondary | ICD-10-CM | POA: Diagnosis not present

## 2022-07-05 DIAGNOSIS — E785 Hyperlipidemia, unspecified: Secondary | ICD-10-CM | POA: Diagnosis not present

## 2022-07-29 DIAGNOSIS — M9905 Segmental and somatic dysfunction of pelvic region: Secondary | ICD-10-CM | POA: Diagnosis not present

## 2022-07-29 DIAGNOSIS — M62830 Muscle spasm of back: Secondary | ICD-10-CM | POA: Diagnosis not present

## 2022-07-29 DIAGNOSIS — M9903 Segmental and somatic dysfunction of lumbar region: Secondary | ICD-10-CM | POA: Diagnosis not present

## 2022-07-29 DIAGNOSIS — M5451 Vertebrogenic low back pain: Secondary | ICD-10-CM | POA: Diagnosis not present

## 2022-07-29 DIAGNOSIS — M9904 Segmental and somatic dysfunction of sacral region: Secondary | ICD-10-CM | POA: Diagnosis not present

## 2022-08-12 DIAGNOSIS — C44399 Other specified malignant neoplasm of skin of other parts of face: Secondary | ICD-10-CM | POA: Diagnosis not present

## 2022-08-13 DIAGNOSIS — C44399 Other specified malignant neoplasm of skin of other parts of face: Secondary | ICD-10-CM | POA: Diagnosis not present

## 2022-08-20 DIAGNOSIS — Z4802 Encounter for removal of sutures: Secondary | ICD-10-CM | POA: Diagnosis not present

## 2022-09-02 ENCOUNTER — Other Ambulatory Visit: Payer: Self-pay | Admitting: Family Medicine

## 2022-09-02 DIAGNOSIS — I10 Essential (primary) hypertension: Secondary | ICD-10-CM

## 2022-09-18 DIAGNOSIS — M5451 Vertebrogenic low back pain: Secondary | ICD-10-CM | POA: Diagnosis not present

## 2022-09-18 DIAGNOSIS — M62830 Muscle spasm of back: Secondary | ICD-10-CM | POA: Diagnosis not present

## 2022-09-18 DIAGNOSIS — M9905 Segmental and somatic dysfunction of pelvic region: Secondary | ICD-10-CM | POA: Diagnosis not present

## 2022-09-18 DIAGNOSIS — M9904 Segmental and somatic dysfunction of sacral region: Secondary | ICD-10-CM | POA: Diagnosis not present

## 2022-09-18 DIAGNOSIS — M9903 Segmental and somatic dysfunction of lumbar region: Secondary | ICD-10-CM | POA: Diagnosis not present

## 2022-10-08 ENCOUNTER — Ambulatory Visit (INDEPENDENT_AMBULATORY_CARE_PROVIDER_SITE_OTHER): Payer: Medicare HMO | Admitting: Vascular Surgery

## 2022-10-08 DIAGNOSIS — I70211 Atherosclerosis of native arteries of extremities with intermittent claudication, right leg: Secondary | ICD-10-CM | POA: Diagnosis not present

## 2022-10-08 NOTE — Progress Notes (Signed)
Virtual Visit via Telephone Note    I connected with Roger Stewart on 10/08/2022 using the [email or web address] by telephone and verified that I was speaking with the correct person using two identifiers. Patient was located at home. I am located at [facility] office.   The limitations of evaluation and management by telemedicine and the availability of in person appointments have been previously discussed with the patient and are documented in the patients chart. The patient expressed understanding and consented to proceed.  PCP: Mliss Sax, MD  Chief Complaint: Discuss right leg femoral to popliteal artery bypass for disabling claudication  History of Present Illness: Roger Stewart is a 77 y.o. male with history of hypertension hyperlipidemia followed for claudication of the right leg.  Ultimately his claudication became more disabling.  He underwent angiogram on 01/24/2022 showing a flush SFA occlusion on the right.  I offered him a right common femoral to below-knee popliteal bypass and he presents to further discuss.    Since last discussion he is now back from [place].  States his right leg is getting no better.  He has tried walking therapies.  Unable to play pickle ball or tennis.  States he can walk but really not far.  Worried about not having good cardiovascular fitness because his leg limits him.  Past Medical History:  Diagnosis Date   Cancer (HCC)    Basal Cell X 8; Dr Margo Aye   Colon polyps    Dr Madilyn Fireman   Diverticulosis of colon    Hyperlipidemia    elevated TG   Hypertension    Sciatica     Past Surgical History:  Procedure Laterality Date   ABDOMINAL AORTIC ANEURYSM REPAIR  2005   ABDOMINAL AORTOGRAM W/LOWER EXTREMITY Right 01/24/2022   Procedure: ABDOMINAL AORTOGRAM W/LOWER EXTREMITY;  Surgeon: Cephus Shelling, MD;  Location: [facility];  Service: Cardiovascular;  Laterality: Right;   colonoscopy with polypectomy  2010;2016   Dr Madilyn Fireman   [facility] X3     LS  spine   LUMBAR SPINE SURGERY  04/2010   spur & bulging disc L 4-5, [place] , [place]   LUMBAR SPINE SURGERY  08/2010   cyst resected @ op site    No outpatient medications have been marked as taking for the 10/08/22 encounter (Appointment) with Cephus Shelling, MD.    12 system ROS was negative unless otherwise noted in HPI   Observations/Objective:  Angiogram images reviewed from 01/24/2022 showing a flush right SFA occlusion with reconstitution of an [place] of SFA distally and then an above-knee popliteal occlusion and then a below-knee target with three-vessel runoff.  Assessment and Plan:  77 year old male with disabling claudication in the right leg.  I re-reviewed his arteriogram images and discussed he has a flush SFA occlusion on the right that makes endovascular therapy with stenting not a good option.  I have offered him a right common femoral to below-knee popliteal bypass with vein.  Vein mapping does show a usable great saphenous vein.  We discussed basic steps of the operation as well as 3 to 4 days in the hospital and up to a 4-6 week recovery.  His right leg is not getting any better with conservative therapy.  He is leaning toward having surgery in either June or August pending his schedule as he just got back from [place].  Follow Up Instructions:  Schedule right femoral-popliteal bypass  I discussed the assessment and treatment plan with the patient. The  patient was provided an opportunity to ask questions and all were answered. The patient agreed with the plan and demonstrated an understanding of the instructions.   The patient was advised to call back or seek an in-person evaluation if the symptoms worsen or if the condition fails to improve as anticipated.  I spent 5 minutes with the patient via telephone encounter.   Signed, Cephus Shelling [facility] Office: [phone]  Cephus Shelling   10/08/2022, 9:47 AM

## 2022-10-11 ENCOUNTER — Telehealth: Payer: Self-pay | Admitting: Vascular Surgery

## 2022-10-15 ENCOUNTER — Encounter (HOSPITAL_COMMUNITY): Payer: Medicare HMO

## 2022-10-15 ENCOUNTER — Ambulatory Visit: Payer: Medicare HMO | Admitting: Vascular Surgery

## 2022-10-18 NOTE — Telephone Encounter (Signed)
Appt has been scheduled.

## 2022-11-18 ENCOUNTER — Ambulatory Visit (INDEPENDENT_AMBULATORY_CARE_PROVIDER_SITE_OTHER): Payer: Medicare HMO | Admitting: Family Medicine

## 2022-11-18 ENCOUNTER — Encounter: Payer: Self-pay | Admitting: Family Medicine

## 2022-11-18 VITALS — BP 124/72 | HR 56 | Temp 97.0°F | Ht 72.0 in | Wt 196.2 lb

## 2022-11-18 DIAGNOSIS — I7090 Unspecified atherosclerosis: Secondary | ICD-10-CM

## 2022-11-18 DIAGNOSIS — Z720 Tobacco use: Secondary | ICD-10-CM

## 2022-11-18 DIAGNOSIS — I10 Essential (primary) hypertension: Secondary | ICD-10-CM

## 2022-11-18 DIAGNOSIS — Z8739 Personal history of other diseases of the musculoskeletal system and connective tissue: Secondary | ICD-10-CM | POA: Diagnosis not present

## 2022-11-18 DIAGNOSIS — E7800 Pure hypercholesterolemia, unspecified: Secondary | ICD-10-CM

## 2022-11-18 LAB — CBC
HCT: 39.8 % (ref 39.0–52.0)
Hemoglobin: 13.3 g/dL (ref 13.0–17.0)
MCHC: 33.4 g/dL (ref 30.0–36.0)
MCV: 97 fl (ref 78.0–100.0)
Platelets: 208 10*3/uL (ref 150.0–400.0)
RBC: 4.1 Mil/uL — ABNORMAL LOW (ref 4.22–5.81)
RDW: 15 % (ref 11.5–15.5)
WBC: 6.8 10*3/uL (ref 4.0–10.5)

## 2022-11-18 LAB — BASIC METABOLIC PANEL
BUN: 13 mg/dL (ref 6–23)
CO2: 28 mEq/L (ref 19–32)
Calcium: 9 mg/dL (ref 8.4–10.5)
Chloride: 101 mEq/L (ref 96–112)
Creatinine, Ser: 0.83 mg/dL (ref 0.40–1.50)
GFR: 84.29 mL/min (ref >60.00)
Glucose, Bld: 107 mg/dL — ABNORMAL HIGH (ref 70–99)
Potassium: 4.2 mEq/L (ref 3.5–5.1)
Sodium: 138 mEq/L (ref 135–145)

## 2022-11-18 LAB — LIPID PANEL
Cholesterol: 138 mg/dL (ref 0–200)
HDL: 69.1 mg/dL (ref >39.00)
LDL Cholesterol: 52 mg/dL (ref 0–99)
NonHDL: 68.84
Total CHOL/HDL Ratio: 2
Triglycerides: 83 mg/dL (ref 0.0–149.0)
VLDL: 16.6 mg/dL (ref 0.0–40.0)

## 2022-11-18 LAB — URIC ACID: Uric Acid, Serum: 4.6 mg/dL (ref 4.0–7.8)

## 2022-11-18 MED ORDER — ALLOPURINOL 100 MG PO TABS: 100.0000 mg | ORAL_TABLET | Freq: Every day | ORAL | 1 refills | Status: DC

## 2022-11-18 NOTE — Progress Notes (Signed)
Established Patient Office Visit   Subjective:  Patient ID: Roger Stewart, male    DOB: [birth year]  Age: 77 y.o. MRN: [id number]  Chief Complaint  Patient presents with   Annual Exam    CPE, no concerns. Patient fasting.     HPI Encounter Diagnoses  Name Primary?   Essential hypertension Yes   Elevated LDL cholesterol level    Atherosclerotic vascular disease    Tobacco use    History of gout    For follow-up of above.  Continues metoprolol for hypertension and vascular disease.  Continues atorvastatin for elevated cholesterol and vascular disease.  Continues Pletal for vascular disease.  Has been able to walk is much as a mile and a half without claudication pains.  Continues allopurinol for history of gout.  Continues to smoke a cigar each day.   Review of Systems  Constitutional: Negative.   HENT: Negative.    Eyes:  Negative for blurred vision, discharge and redness.  Respiratory: Negative.    Cardiovascular: Negative.   Gastrointestinal:  Negative for abdominal pain.  Genitourinary: Negative.   Musculoskeletal: Negative.  Negative for myalgias.  Skin:  Negative for rash.  Neurological:  Negative for tingling, loss of consciousness and weakness.  Endo/Heme/Allergies:  Negative for polydipsia.      11/18/2022   10:36 AM 11/18/2022   10:30 AM 06/28/2022   11:37 AM  Depression screen PHQ 2/9  Decreased Interest 0 0 0  Down, Depressed, Hopeless 0 0 0  PHQ - 2 Score 0 0 0  Altered sleeping 0    Tired, decreased energy 0    Change in appetite 0    Feeling bad or failure about yourself  0    Trouble concentrating 0    Moving slowly or fidgety/restless 0    Suicidal thoughts 0    PHQ-9 Score 0    Difficult doing work/chores Not difficult at all        Current Outpatient Medications:    atorvastatin (LIPITOR) 20 MG tablet, TAKE 1 TABLET BY MOUTH EVERY DAY NEEDS APPOINTMENT FOR ADDITIONAL REFILLS, Disp: 90 tablet, Rfl: 3   cetirizine (ZYRTEC) 10 MG tablet, Take  10 mg by mouth daily as needed for allergies., Disp: , Rfl:    cilostazol (PLETAL) 100 MG tablet, TAKE 1 TABLET BY MOUTH TWICE A DAY, Disp: 180 tablet, Rfl: 1   Ibuprofen-Acetaminophen 125-250 MG TABS, Take 1 tablet by mouth daily as needed (pain.)., Disp: , Rfl:    indomethacin (INDOCIN) 50 MG capsule, Take 50 mg by mouth 2 (two) times daily as needed (gout flares.)., Disp: , Rfl:    metoprolol tartrate (LOPRESSOR) 25 MG tablet, TAKE 1 TABLET BY MOUTH EVERY DAY, Disp: 90 tablet, Rfl: 1   Misc Natural Products (BRAINSTRONG MEMORY SUPPORT) TABS, Take 1 tablet by mouth daily., Disp: , Rfl:    Misc Natural Products (PROSTATE SUPPORT PO), Take 1 capsule by mouth in the morning., Disp: , Rfl:    Multiple Vitamin (MULTIVITAMIN WITH MINERALS) TABS tablet, Take 1 tablet by mouth daily., Disp: , Rfl:    OVER THE COUNTER MEDICATION, Take 1 capsule by mouth in the morning. Nitric Oxide Supplement, Disp: , Rfl:    Polyethyl Glycol-Propyl Glycol (LUBRICANT EYE DROPS) 0.4-0.3 % SOLN, Place 1-2 drops into both eyes 3 (three) times daily as needed (dry/irritated eyes.)., Disp: , Rfl:    allopurinol (ZYLOPRIM) 100 MG tablet, Take 1 tablet (100 mg total) by mouth daily., Disp: 90 tablet, Rfl: 1  Coenzyme Q10-Vitamin E (QUNOL ULTRA COQ10 PO), Take 1 capsule by mouth in the morning. (Patient not taking: Reported on 06/28/2022), Disp: , Rfl:    Objective:     BP 124/72 (BP Location: Right Arm, Patient Position: Sitting, Cuff Size: Normal)   Pulse (!) 56   Temp (!) 97 F (36.1 C) (Temporal)   Ht 6' (1.829 m)   Wt 196 lb 3.2 oz (89 kg)   SpO2 95%   BMI 26.61 kg/m    Physical Exam Constitutional:      General: He is not in acute distress.    Appearance: Normal appearance. He is not ill-appearing, toxic-appearing or diaphoretic.  HENT:     Head: Normocephalic and atraumatic.     Right Ear: External ear normal.     Left Ear: External ear normal.     Mouth/Throat:     Mouth: Mucous membranes are moist.      Pharynx: Oropharynx is clear. No oropharyngeal exudate or posterior oropharyngeal erythema.  Eyes:     General: No scleral icterus.       Right eye: No discharge.        Left eye: No discharge.     Extraocular Movements: Extraocular movements intact.     Conjunctiva/sclera: Conjunctivae normal.     Pupils: Pupils are equal, round, and reactive to light.  Cardiovascular:     Rate and Rhythm: Normal rate and regular rhythm.  Pulmonary:     Effort: Pulmonary effort is normal. No respiratory distress.     Breath sounds: Normal breath sounds.  Abdominal:     General: Bowel sounds are normal.  Musculoskeletal:     Cervical back: No rigidity or tenderness.  Lymphadenopathy:     Cervical: No cervical adenopathy.  Skin:    General: Skin is warm and dry.  Neurological:     Mental Status: He is alert and oriented to person, place, and time.  Psychiatric:        Mood and Affect: Mood normal.        Behavior: Behavior normal.      No results found for any visits on 11/18/22.    The 10-year ASCVD risk score (Arnett DK, et al., 2019) is: 26.3%    Assessment & Plan:   Essential hypertension -     Basic metabolic panel -     CBC  Elevated LDL cholesterol level -     Lipid panel  Atherosclerotic vascular disease -     Lipid panel  Tobacco use  History of gout -     Uric acid -     Allopurinol; Take 1 tablet (100 mg total) by mouth daily.  Dispense: 90 tablet; Refill: 1    Return in about 6 months (around 05/20/2023).  Advised patient to stop smoking.  Advised that this could halt the progression of his vascular disease.  He was given information tips to quit smoking.  Mliss Sax, MD

## 2022-11-26 ENCOUNTER — Encounter: Payer: Self-pay | Admitting: Vascular Surgery

## 2022-11-26 ENCOUNTER — Ambulatory Visit: Payer: Medicare HMO | Admitting: Vascular Surgery

## 2022-11-26 ENCOUNTER — Ambulatory Visit (HOSPITAL_COMMUNITY)
Admission: RE | Admit: 2022-11-26 | Discharge: 2022-11-26 | Disposition: A | Discharge status: Home / Self Care | Payer: Medicare HMO | Source: Ambulatory Visit | Attending: Vascular Surgery | Admitting: Vascular Surgery

## 2022-11-26 VITALS — BP 125/83 | HR 69 | Temp 97.8°F | Resp 16 | Ht 72.0 in | Wt 195.0 lb

## 2022-11-26 DIAGNOSIS — I739 Peripheral vascular disease, unspecified: Secondary | ICD-10-CM | POA: Diagnosis not present

## 2022-11-26 DIAGNOSIS — I70211 Atherosclerosis of native arteries of extremities with intermittent claudication, right leg: Secondary | ICD-10-CM | POA: Diagnosis not present

## 2022-11-26 LAB — VAS US ABI WITH/WO TBI
Left ABI: 1.25
Right ABI: 0.76

## 2022-11-26 NOTE — Progress Notes (Signed)
Patient name: Roger Stewart MRN: [id number] DOB: 1946-04-22 Sex: male  REASON FOR CONSULT: 6 month follow-up + ABI for right lower extremity claudication  HPI: Roger Stewart is a 77 y.o. male, with history of hypertension and hyperlipidemia that presents for 6 month follow-up of claudication in his right leg.  He was initially seen with burning in the right calf after walking about 5 to 10 minutes.  He spends about half his year in [place].  His symptoms were worsening over time and could not play tennis. When I last saw him in April he was planning to schedule a femoral-popliteal bypass in the right leg when back from [place]..  Since last evaluation he states that his right leg is doing a bit better.  He went to the [employer] baseball game this past weekend states he walked about a mile to mile and a half to the car without significant symptoms.  States he is playing golf.  Still limited with tennis.    Past Medical History:  Diagnosis Date   Cancer (HCC)    Basal Cell X 8; Dr Margo Aye   Colon polyps    Dr Madilyn Fireman   Diverticulosis of colon    Hyperlipidemia    elevated TG   Hypertension    Sciatica     Past Surgical History:  Procedure Laterality Date   ABDOMINAL AORTIC ANEURYSM REPAIR  2005   ABDOMINAL AORTOGRAM W/LOWER EXTREMITY Right 01/24/2022   Procedure: ABDOMINAL AORTOGRAM W/LOWER EXTREMITY;  Surgeon: Cephus Shelling, MD;  Location: [facility];  Service: Cardiovascular;  Laterality: Right;   colonoscopy with polypectomy  2010;2016   Dr Madilyn Fireman   [facility] X3     LS spine   LUMBAR SPINE SURGERY  04/2010   spur & bulging disc L 4-5, [place] , [place]   LUMBAR SPINE SURGERY  08/2010   cyst resected @ op site    Family History  Problem Relation Age of Onset   Heart disease Mother        CHF ,CABG   Prostate cancer Father    Hypertension Father    Transient ischemic attack Father        in 38s   Hypertension Brother    Acromegaly Brother    Prostate cancer Brother     Leukemia Brother    Diabetes Neg Hx     SOCIAL HISTORY: Social History   Socioeconomic History   Marital status: Legally Separated    Spouse name: Not on file   Number of children: 1   Years of education: 16   Highest education level: Not on file  Occupational History   Not on file  Tobacco Use   Smoking status: Every Day    Types: Cigars    Last attempt to quit: 06/10/2000    Years since quitting: 22.4   Smokeless tobacco: Never   Tobacco comments:     Smoked [phone], up to 3/4 ppd. 05/22/15 1 cigar a day. 10/26/19 2-4 cigars daily  Vaping Use   Vaping Use: Never used  Substance and Sexual Activity   Alcohol use: Yes    Alcohol/week: 16.0 standard drinks of alcohol    Types: 2 Glasses of wine, 14 Cans of beer per week    Comment: daily   Drug use: No   Sexual activity: Not Currently  Other Topics Concern   Not on file  Social History Narrative   Denies abuse and feels safe at home.  Social Determinants of Health   Financial Resource Strain: Low Risk  (06/28/2022)   Overall Financial Resource Strain (CARDIA)    Difficulty of Paying Living Expenses: Not hard at all  Food Insecurity: No Food Insecurity (06/28/2022)   Hunger Vital Sign    Worried About Running Out of Food in the Last Year: Never true    Ran Out of Food in the Last Year: Never true  Transportation Needs: No Transportation Needs (06/28/2022)   PRAPARE - Administrator, Civil Service (Medical): No    Lack of Transportation (Non-Medical): No  Physical Activity: Insufficiently Active (06/28/2022)   Exercise Vital Sign    Days of Exercise per Week: 3 days    Minutes of Exercise per Session: 30 min  Stress: No Stress Concern Present (06/28/2022)   [employer] of Occupational Health - Occupational Stress Questionnaire    Feeling of Stress : Not at all  Social Connections: Moderately Isolated (06/06/2021)   Social Connection and Isolation Panel [NHANES]    Frequency of Communication with  Friends and Family: Twice a week    Frequency of Social Gatherings with Friends and Family: Twice a week    Attends Religious Services: Never    Database administrator or Organizations: Yes    Attends Engineer, structural: More than 4 times per year    Marital Status: Separated  Intimate Partner Violence: Not At Risk (06/06/2021)   Humiliation, Afraid, Rape, and Kick questionnaire    Fear of Current or Ex-Partner: No    Emotionally Abused: No    Physically Abused: No    Sexually Abused: No    Allergies  Allergen Reactions   Norvasc [Amlodipine Besylate] Other (See Comments)    Body aches    Current Outpatient Medications  Medication Sig Dispense Refill   allopurinol (ZYLOPRIM) 100 MG tablet Take 1 tablet (100 mg total) by mouth daily. 90 tablet 1   atorvastatin (LIPITOR) 20 MG tablet TAKE 1 TABLET BY MOUTH EVERY DAY NEEDS APPOINTMENT FOR ADDITIONAL REFILLS 90 tablet 3   cetirizine (ZYRTEC) 10 MG tablet Take 10 mg by mouth daily as needed for allergies.     cilostazol (PLETAL) 100 MG tablet TAKE 1 TABLET BY MOUTH TWICE A DAY 180 tablet 1   Coenzyme Q10-Vitamin E (QUNOL ULTRA COQ10 PO) Take 1 capsule by mouth in the morning. (Patient not taking: Reported on 06/28/2022)     Ibuprofen-Acetaminophen 125-250 MG TABS Take 1 tablet by mouth daily as needed (pain.).     indomethacin (INDOCIN) 50 MG capsule Take 50 mg by mouth 2 (two) times daily as needed (gout flares.).     metoprolol tartrate (LOPRESSOR) 25 MG tablet TAKE 1 TABLET BY MOUTH EVERY DAY 90 tablet 1   Misc Natural Products (BRAINSTRONG MEMORY SUPPORT) TABS Take 1 tablet by mouth daily.     Misc Natural Products (PROSTATE SUPPORT PO) Take 1 capsule by mouth in the morning.     Multiple Vitamin (MULTIVITAMIN WITH MINERALS) TABS tablet Take 1 tablet by mouth daily.     OVER THE COUNTER MEDICATION Take 1 capsule by mouth in the morning. Nitric Oxide Supplement     Polyethyl Glycol-Propyl Glycol (LUBRICANT EYE DROPS) 0.4-0.3  % SOLN Place 1-2 drops into both eyes 3 (three) times daily as needed (dry/irritated eyes.).     No current facility-administered medications for this visit.    REVIEW OF SYSTEMS:  [X]  denotes positive finding, [ ]  denotes negative finding Cardiac  Comments:  Chest pain or chest pressure:    Shortness of breath upon exertion:    Short of breath when lying flat:    Irregular heart rhythm:        Vascular    Pain in calf, thigh, or hip brought on by ambulation: x Right  Pain in feet at night that wakes you up from your sleep:     Blood clot in your veins:    Leg swelling:         Pulmonary    Oxygen at home:    Productive cough:     Wheezing:         Neurologic    Sudden weakness in arms or legs:     Sudden numbness in arms or legs:     Sudden onset of difficulty speaking or slurred speech:    Temporary loss of vision in one eye:     Problems with dizziness:         Gastrointestinal    Blood in stool:     Vomited blood:         Genitourinary    Burning when urinating:     Blood in urine:        Psychiatric    Major depression:         Hematologic    Bleeding problems:    Problems with blood clotting too easily:        Skin    Rashes or ulcers:        Constitutional    Fever or chills:      PHYSICAL EXAM: There were no vitals filed for this visit.   GENERAL: The patient is a well-nourished male, in no acute distress. The vital signs are documented above. CARDIAC: There is a regular rate and rhythm.  VASCULAR:  2+ palpable femoral pulse bilaterally Left dorsalis pedis and posterior tibial pulse palpable No palpable right pedal pulses No lower extremity tissue loss PULMONARY: No respiratory  ABDOMEN: Soft and non-tender. MUSCULOSKELETAL: There are no major deformities or cyanosis. NEUROLOGIC: No focal weakness or paresthesias are detected. SKIN: There are no ulcers or rashes noted. PSYCHIATRIC: The patient has a normal affect.  DATA:   ABI's today  0.76 right monophasic and 1.25 left triphasic  Assessment/Plan:  77 year old male presents for 6 month interval follow-up of his PAD with right lower extremity claudication.   When I last saw him in April he was planning to schedule a right femoral-popliteal bypass for a flush SFA occlusion.  Since last evaluation he has actually seen some improvement in his right leg symptoms.  His ABIs are increased to 0.76 on the right today from 0.64.  States over the weekend he was able to walk about a mile to  mile and a half to his car after [employer] baseball game.  I discussed if he has minimal symptoms that are not disabling I would certainly delay any revascularization at this time.  I will see him in 6 months with repeat ABIs.  He is on pletal.  Statin for risk reduction.  Cephus Shelling, MD [facility] Office: [phone]  Cephus Shelling

## 2022-12-07 ENCOUNTER — Other Ambulatory Visit: Payer: Self-pay

## 2022-12-07 DIAGNOSIS — I70211 Atherosclerosis of native arteries of extremities with intermittent claudication, right leg: Secondary | ICD-10-CM

## 2022-12-13 ENCOUNTER — Other Ambulatory Visit: Payer: Self-pay | Admitting: Family Medicine

## 2022-12-13 DIAGNOSIS — E7800 Pure hypercholesterolemia, unspecified: Secondary | ICD-10-CM

## 2022-12-20 NOTE — Progress Notes (Signed)
Fax received from [facility] on 11/26/22 for medical clearance/medication hold for colonoscopy to be signed by C. Chestine Spore, MD.  Provider signed on 12/17/22, form faxed back to sender on 12/18/22, verified successful, sent to scan center.

## 2023-01-02 DIAGNOSIS — M9905 Segmental and somatic dysfunction of pelvic region: Secondary | ICD-10-CM | POA: Diagnosis not present

## 2023-01-02 DIAGNOSIS — S39013A Strain of muscle, fascia and tendon of pelvis, initial encounter: Secondary | ICD-10-CM | POA: Diagnosis not present

## 2023-01-02 DIAGNOSIS — M9904 Segmental and somatic dysfunction of sacral region: Secondary | ICD-10-CM | POA: Diagnosis not present

## 2023-01-02 DIAGNOSIS — M5432 Sciatica, left side: Secondary | ICD-10-CM | POA: Diagnosis not present

## 2023-01-02 DIAGNOSIS — S336XXA Sprain of sacroiliac joint, initial encounter: Secondary | ICD-10-CM | POA: Diagnosis not present

## 2023-01-02 DIAGNOSIS — M9903 Segmental and somatic dysfunction of lumbar region: Secondary | ICD-10-CM | POA: Diagnosis not present

## 2023-01-07 DIAGNOSIS — N492 Inflammatory disorders of scrotum: Secondary | ICD-10-CM | POA: Diagnosis not present

## 2023-01-09 DIAGNOSIS — N492 Inflammatory disorders of scrotum: Secondary | ICD-10-CM | POA: Diagnosis not present

## 2023-01-14 DIAGNOSIS — Z09 Encounter for follow-up examination after completed treatment for conditions other than malignant neoplasm: Secondary | ICD-10-CM | POA: Diagnosis not present

## 2023-01-14 DIAGNOSIS — D120 Benign neoplasm of cecum: Secondary | ICD-10-CM | POA: Diagnosis not present

## 2023-01-14 DIAGNOSIS — K648 Other hemorrhoids: Secondary | ICD-10-CM | POA: Diagnosis not present

## 2023-01-14 DIAGNOSIS — K5730 Diverticulosis of large intestine without perforation or abscess without bleeding: Secondary | ICD-10-CM | POA: Diagnosis not present

## 2023-01-14 DIAGNOSIS — Z86010 Personal history of colonic polyps: Secondary | ICD-10-CM | POA: Diagnosis not present

## 2023-01-16 DIAGNOSIS — D120 Benign neoplasm of cecum: Secondary | ICD-10-CM | POA: Diagnosis not present

## 2023-02-03 DIAGNOSIS — M9905 Segmental and somatic dysfunction of pelvic region: Secondary | ICD-10-CM | POA: Diagnosis not present

## 2023-02-03 DIAGNOSIS — M9903 Segmental and somatic dysfunction of lumbar region: Secondary | ICD-10-CM | POA: Diagnosis not present

## 2023-02-03 DIAGNOSIS — S39013A Strain of muscle, fascia and tendon of pelvis, initial encounter: Secondary | ICD-10-CM | POA: Diagnosis not present

## 2023-02-03 DIAGNOSIS — M5432 Sciatica, left side: Secondary | ICD-10-CM | POA: Diagnosis not present

## 2023-02-03 DIAGNOSIS — S336XXA Sprain of sacroiliac joint, initial encounter: Secondary | ICD-10-CM | POA: Diagnosis not present

## 2023-02-03 DIAGNOSIS — M9904 Segmental and somatic dysfunction of sacral region: Secondary | ICD-10-CM | POA: Diagnosis not present

## 2023-02-04 DIAGNOSIS — M5432 Sciatica, left side: Secondary | ICD-10-CM | POA: Diagnosis not present

## 2023-02-04 DIAGNOSIS — M9903 Segmental and somatic dysfunction of lumbar region: Secondary | ICD-10-CM | POA: Diagnosis not present

## 2023-02-04 DIAGNOSIS — M9905 Segmental and somatic dysfunction of pelvic region: Secondary | ICD-10-CM | POA: Diagnosis not present

## 2023-02-04 DIAGNOSIS — S39013A Strain of muscle, fascia and tendon of pelvis, initial encounter: Secondary | ICD-10-CM | POA: Diagnosis not present

## 2023-02-04 DIAGNOSIS — M9904 Segmental and somatic dysfunction of sacral region: Secondary | ICD-10-CM | POA: Diagnosis not present

## 2023-02-04 DIAGNOSIS — S336XXA Sprain of sacroiliac joint, initial encounter: Secondary | ICD-10-CM | POA: Diagnosis not present

## 2023-02-06 DIAGNOSIS — M9905 Segmental and somatic dysfunction of pelvic region: Secondary | ICD-10-CM | POA: Diagnosis not present

## 2023-02-06 DIAGNOSIS — M5432 Sciatica, left side: Secondary | ICD-10-CM | POA: Diagnosis not present

## 2023-02-06 DIAGNOSIS — M9903 Segmental and somatic dysfunction of lumbar region: Secondary | ICD-10-CM | POA: Diagnosis not present

## 2023-02-06 DIAGNOSIS — S336XXA Sprain of sacroiliac joint, initial encounter: Secondary | ICD-10-CM | POA: Diagnosis not present

## 2023-02-06 DIAGNOSIS — S39013A Strain of muscle, fascia and tendon of pelvis, initial encounter: Secondary | ICD-10-CM | POA: Diagnosis not present

## 2023-02-06 DIAGNOSIS — M9904 Segmental and somatic dysfunction of sacral region: Secondary | ICD-10-CM | POA: Diagnosis not present

## 2023-02-13 DIAGNOSIS — H40013 Open angle with borderline findings, low risk, bilateral: Secondary | ICD-10-CM | POA: Diagnosis not present

## 2023-02-13 DIAGNOSIS — H25041 Posterior subcapsular polar age-related cataract, right eye: Secondary | ICD-10-CM | POA: Diagnosis not present

## 2023-02-13 DIAGNOSIS — H2513 Age-related nuclear cataract, bilateral: Secondary | ICD-10-CM | POA: Diagnosis not present

## 2023-02-13 DIAGNOSIS — H25013 Cortical age-related cataract, bilateral: Secondary | ICD-10-CM | POA: Diagnosis not present

## 2023-02-21 ENCOUNTER — Telehealth: Payer: Self-pay | Admitting: Family Medicine

## 2023-02-21 NOTE — Telephone Encounter (Signed)
Pt woke up with L ankle pain yesterday 02/20/23, by the end of the day he was in a lot of pain. He took his allopurinol (ZYLOPRIM) 100 MG tablet [[id number]]. He is wondering if he can be prescribed something that can knock the pain and now swelling out of his ankle. I offered an appt, he wanted to hear from Dr Doreene Burke first. Pt at [phone]

## 2023-02-27 NOTE — Telephone Encounter (Signed)
LVM for pt to call the office to schedule an OV to be seen, per PCP. Pt needs to be seen before this RF can be sent in.

## 2023-03-09 ENCOUNTER — Other Ambulatory Visit: Payer: Self-pay | Admitting: Family Medicine

## 2023-03-09 DIAGNOSIS — I10 Essential (primary) hypertension: Secondary | ICD-10-CM

## 2023-04-02 DIAGNOSIS — D2339 Other benign neoplasm of skin of other parts of face: Secondary | ICD-10-CM | POA: Diagnosis not present

## 2023-04-02 DIAGNOSIS — D2362 Other benign neoplasm of skin of left upper limb, including shoulder: Secondary | ICD-10-CM | POA: Diagnosis not present

## 2023-04-02 DIAGNOSIS — M9905 Segmental and somatic dysfunction of pelvic region: Secondary | ICD-10-CM | POA: Diagnosis not present

## 2023-04-02 DIAGNOSIS — C44519 Basal cell carcinoma of skin of other part of trunk: Secondary | ICD-10-CM | POA: Diagnosis not present

## 2023-04-02 DIAGNOSIS — M62830 Muscle spasm of back: Secondary | ICD-10-CM | POA: Diagnosis not present

## 2023-04-02 DIAGNOSIS — M5451 Vertebrogenic low back pain: Secondary | ICD-10-CM | POA: Diagnosis not present

## 2023-04-02 DIAGNOSIS — D2372 Other benign neoplasm of skin of left lower limb, including hip: Secondary | ICD-10-CM | POA: Diagnosis not present

## 2023-04-02 DIAGNOSIS — D2361 Other benign neoplasm of skin of right upper limb, including shoulder: Secondary | ICD-10-CM | POA: Diagnosis not present

## 2023-04-02 DIAGNOSIS — L218 Other seborrheic dermatitis: Secondary | ICD-10-CM | POA: Diagnosis not present

## 2023-04-02 DIAGNOSIS — M9904 Segmental and somatic dysfunction of sacral region: Secondary | ICD-10-CM | POA: Diagnosis not present

## 2023-04-02 DIAGNOSIS — D235 Other benign neoplasm of skin of trunk: Secondary | ICD-10-CM | POA: Diagnosis not present

## 2023-04-02 DIAGNOSIS — M9903 Segmental and somatic dysfunction of lumbar region: Secondary | ICD-10-CM | POA: Diagnosis not present

## 2023-04-02 DIAGNOSIS — L578 Other skin changes due to chronic exposure to nonionizing radiation: Secondary | ICD-10-CM | POA: Diagnosis not present

## 2023-04-02 DIAGNOSIS — D2371 Other benign neoplasm of skin of right lower limb, including hip: Secondary | ICD-10-CM | POA: Diagnosis not present

## 2023-04-02 DIAGNOSIS — L2089 Other atopic dermatitis: Secondary | ICD-10-CM | POA: Diagnosis not present

## 2023-04-02 DIAGNOSIS — L570 Actinic keratosis: Secondary | ICD-10-CM | POA: Diagnosis not present

## 2023-04-02 DIAGNOSIS — D485 Neoplasm of uncertain behavior of skin: Secondary | ICD-10-CM | POA: Diagnosis not present

## 2023-04-02 DIAGNOSIS — X32XXXA Exposure to sunlight, initial encounter: Secondary | ICD-10-CM | POA: Diagnosis not present

## 2023-04-14 DIAGNOSIS — M9904 Segmental and somatic dysfunction of sacral region: Secondary | ICD-10-CM | POA: Diagnosis not present

## 2023-04-14 DIAGNOSIS — M542 Cervicalgia: Secondary | ICD-10-CM | POA: Diagnosis not present

## 2023-04-14 DIAGNOSIS — M9903 Segmental and somatic dysfunction of lumbar region: Secondary | ICD-10-CM | POA: Diagnosis not present

## 2023-04-14 DIAGNOSIS — M9901 Segmental and somatic dysfunction of cervical region: Secondary | ICD-10-CM | POA: Diagnosis not present

## 2023-04-14 DIAGNOSIS — M62830 Muscle spasm of back: Secondary | ICD-10-CM | POA: Diagnosis not present

## 2023-04-14 DIAGNOSIS — M5451 Vertebrogenic low back pain: Secondary | ICD-10-CM | POA: Diagnosis not present

## 2023-04-16 DIAGNOSIS — M9903 Segmental and somatic dysfunction of lumbar region: Secondary | ICD-10-CM | POA: Diagnosis not present

## 2023-04-16 DIAGNOSIS — M9904 Segmental and somatic dysfunction of sacral region: Secondary | ICD-10-CM | POA: Diagnosis not present

## 2023-04-16 DIAGNOSIS — M9901 Segmental and somatic dysfunction of cervical region: Secondary | ICD-10-CM | POA: Diagnosis not present

## 2023-04-16 DIAGNOSIS — M62830 Muscle spasm of back: Secondary | ICD-10-CM | POA: Diagnosis not present

## 2023-04-16 DIAGNOSIS — M542 Cervicalgia: Secondary | ICD-10-CM | POA: Diagnosis not present

## 2023-04-16 DIAGNOSIS — M5451 Vertebrogenic low back pain: Secondary | ICD-10-CM | POA: Diagnosis not present

## 2023-04-29 DIAGNOSIS — C44519 Basal cell carcinoma of skin of other part of trunk: Secondary | ICD-10-CM | POA: Diagnosis not present

## 2023-05-15 ENCOUNTER — Other Ambulatory Visit: Payer: Self-pay | Admitting: Family Medicine

## 2023-05-15 DIAGNOSIS — Z8739 Personal history of other diseases of the musculoskeletal system and connective tissue: Secondary | ICD-10-CM

## 2023-05-20 ENCOUNTER — Ambulatory Visit: Payer: Medicare HMO | Admitting: Family Medicine

## 2023-05-20 ENCOUNTER — Encounter (HOSPITAL_COMMUNITY): Payer: Medicare HMO

## 2023-05-20 ENCOUNTER — Encounter: Payer: Self-pay | Admitting: Vascular Surgery

## 2023-05-20 ENCOUNTER — Encounter: Payer: Self-pay | Admitting: Family Medicine

## 2023-05-20 ENCOUNTER — Ambulatory Visit (HOSPITAL_COMMUNITY)
Admission: RE | Admit: 2023-05-20 | Discharge: 2023-05-20 | Disposition: A | Discharge status: Home / Self Care | Payer: Medicare HMO | Source: Ambulatory Visit | Attending: Vascular Surgery | Admitting: Vascular Surgery

## 2023-05-20 ENCOUNTER — Ambulatory Visit: Payer: Medicare HMO | Admitting: Vascular Surgery

## 2023-05-20 VITALS — BP 126/86 | HR 70 | Temp 98.0°F | Ht 72.0 in | Wt 192.2 lb

## 2023-05-20 VITALS — BP 103/59 | HR 86 | Temp 97.9°F | Resp 18 | Ht 72.0 in | Wt 194.0 lb

## 2023-05-20 DIAGNOSIS — E7800 Pure hypercholesterolemia, unspecified: Secondary | ICD-10-CM

## 2023-05-20 DIAGNOSIS — I10 Essential (primary) hypertension: Secondary | ICD-10-CM

## 2023-05-20 DIAGNOSIS — I70211 Atherosclerosis of native arteries of extremities with intermittent claudication, right leg: Secondary | ICD-10-CM | POA: Diagnosis not present

## 2023-05-20 DIAGNOSIS — Z8739 Personal history of other diseases of the musculoskeletal system and connective tissue: Secondary | ICD-10-CM

## 2023-05-20 DIAGNOSIS — Z720 Tobacco use: Secondary | ICD-10-CM | POA: Diagnosis not present

## 2023-05-20 DIAGNOSIS — E782 Mixed hyperlipidemia: Secondary | ICD-10-CM | POA: Diagnosis not present

## 2023-05-20 LAB — COMPREHENSIVE METABOLIC PANEL
ALT: 13 U/L (ref 0–53)
AST: 17 U/L (ref 0–37)
Albumin: 3.3 g/dL — ABNORMAL LOW (ref 3.5–5.2)
Alkaline Phosphatase: 87 U/L (ref 39–117)
BUN: 16 mg/dL (ref 6–23)
CO2: 31 meq/L (ref 19–32)
Calcium: 8.8 mg/dL (ref 8.4–10.5)
Chloride: 103 meq/L (ref 96–112)
Creatinine, Ser: 0.76 mg/dL (ref 0.40–1.50)
GFR: 86.26 mL/min (ref >60.00)
Glucose, Bld: 119 mg/dL — ABNORMAL HIGH (ref 70–99)
Potassium: 3.8 meq/L (ref 3.5–5.1)
Sodium: 140 meq/L (ref 135–145)
Total Bilirubin: 0.9 mg/dL (ref 0.2–1.2)
Total Protein: 6.4 g/dL (ref 6.0–8.3)

## 2023-05-20 LAB — URINALYSIS, ROUTINE W REFLEX MICROSCOPIC
Bilirubin Urine: NEGATIVE
Hgb urine dipstick: NEGATIVE
Ketones, ur: NEGATIVE
Leukocytes,Ua: NEGATIVE
Nitrite: NEGATIVE
Specific Gravity, Urine: 1.025 (ref 1.000–1.030)
Total Protein, Urine: NEGATIVE
Urine Glucose: NEGATIVE
Urobilinogen, UA: 1 (ref 0.0–1.0)
pH: 6 (ref 5.0–8.0)

## 2023-05-20 LAB — CBC WITH DIFFERENTIAL/PLATELET
Basophils Absolute: 0 10*3/uL (ref 0.0–0.1)
Basophils Relative: 0.5 % (ref 0.0–3.0)
Eosinophils Absolute: 0.2 10*3/uL (ref 0.0–0.7)
Eosinophils Relative: 2.5 % (ref 0.0–5.0)
HCT: 39.8 % (ref 39.0–52.0)
Hemoglobin: 13.4 g/dL (ref 13.0–17.0)
Lymphocytes Relative: 40.9 % (ref 12.0–46.0)
Lymphs Abs: 2.5 10*3/uL (ref 0.7–4.0)
MCHC: 33.8 g/dL (ref 30.0–36.0)
MCV: 99.8 fL (ref 78.0–100.0)
Monocytes Absolute: 0.6 10*3/uL (ref 0.1–1.0)
Monocytes Relative: 10.1 % (ref 3.0–12.0)
Neutro Abs: 2.8 10*3/uL (ref 1.4–7.7)
Neutrophils Relative %: 46 % (ref 43.0–77.0)
Platelets: 220 10*3/uL (ref 150.0–400.0)
RBC: 3.99 Mil/uL — ABNORMAL LOW (ref 4.22–5.81)
RDW: 14.6 % (ref 11.5–15.5)
WBC: 6 10*3/uL (ref 4.0–10.5)

## 2023-05-20 LAB — LIPID PANEL
Cholesterol: 136 mg/dL (ref 0–200)
HDL: 71 mg/dL (ref >39.00)
LDL Cholesterol: 51 mg/dL (ref 0–99)
NonHDL: 65.35
Total CHOL/HDL Ratio: 2
Triglycerides: 70 mg/dL (ref 0.0–149.0)
VLDL: 14 mg/dL (ref 0.0–40.0)

## 2023-05-20 LAB — VAS US ABI WITH/WO TBI
Left ABI: 1.33
Right ABI: 0.85

## 2023-05-20 LAB — URIC ACID: Uric Acid, Serum: 4.7 mg/dL (ref 4.0–7.8)

## 2023-05-20 MED ORDER — ALLOPURINOL 100 MG PO TABS: 100.0000 mg | ORAL_TABLET | Freq: Every day | ORAL | 3 refills | Status: AC

## 2023-05-20 NOTE — Progress Notes (Signed)
Established Patient Office Visit   Subjective:  Patient ID: Roger Stewart, male    DOB: 1945/07/10  Age: 77 y.o. MRN: [id number]  Chief Complaint  Patient presents with   Medical Management of Chronic Issues    6 month follow up.     HPI Encounter Diagnoses  Name Primary?   Essential hypertension Yes   Elevated LDL cholesterol level    Tobacco use    History of gout    Mixed hyperlipidemia    Here for follow-up of above.  Continues metoprolol for hypertension.  Continues atorvastatin and Pletal for history of elevated cholesterol would peripheral vascular disease.  Has been taking allopurinol as needed for attacks.  Continues to smoke cigars and is not interested in quitting   Review of Systems  Constitutional: Negative.   HENT: Negative.    Eyes:  Negative for blurred vision, discharge and redness.  Respiratory: Negative.    Cardiovascular: Negative.   Gastrointestinal:  Negative for abdominal pain.  Genitourinary: Negative.   Musculoskeletal: Negative.  Negative for myalgias.  Skin:  Negative for rash.  Neurological:  Negative for tingling, loss of consciousness and weakness.  Endo/Heme/Allergies:  Negative for polydipsia.     Current Outpatient Medications:    atorvastatin (LIPITOR) 20 MG tablet, TAKE 1 TABLET BY MOUTH EVERY DAY, Disp: 90 tablet, Rfl: 3   cetirizine (ZYRTEC) 10 MG tablet, Take 10 mg by mouth daily as needed for allergies., Disp: , Rfl:    cilostazol (PLETAL) 100 MG tablet, TAKE 1 TABLET BY MOUTH TWICE A DAY, Disp: 180 tablet, Rfl: 1   metoprolol tartrate (LOPRESSOR) 25 MG tablet, TAKE 1 TABLET BY MOUTH EVERY DAY, Disp: 90 tablet, Rfl: 1   Misc Natural Products (BRAINSTRONG MEMORY SUPPORT) TABS, Take 1 tablet by mouth daily., Disp: , Rfl:    Misc Natural Products (PROSTATE SUPPORT PO), Take 1 capsule by mouth in the morning., Disp: , Rfl:    Multiple Vitamin (MULTIVITAMIN WITH MINERALS) TABS tablet, Take 1 tablet by mouth daily., Disp: , Rfl:     OVER THE COUNTER MEDICATION, Take 1 capsule by mouth in the morning. Nitric Oxide Supplement, Disp: , Rfl:    Polyethyl Glycol-Propyl Glycol (LUBRICANT EYE DROPS) 0.4-0.3 % SOLN, Place 1-2 drops into both eyes 3 (three) times daily as needed (dry/irritated eyes.)., Disp: , Rfl:    allopurinol (ZYLOPRIM) 100 MG tablet, Take 1 tablet (100 mg total) by mouth daily., Disp: 90 tablet, Rfl: 3   Coenzyme Q10-Vitamin E (QUNOL ULTRA COQ10 PO), Take 1 capsule by mouth in the morning. (Patient not taking: Reported on 06/28/2022), Disp: , Rfl:    Objective:     BP 126/86   Pulse 70   Temp 98 F (36.7 C)   Ht 6' (1.829 m)   Wt 192 lb 3.2 oz (87.2 kg)   SpO2 97%   BMI 26.07 kg/m    Physical Exam Constitutional:      General: He is not in acute distress.    Appearance: Normal appearance. He is not ill-appearing, toxic-appearing or diaphoretic.  HENT:     Head: Normocephalic and atraumatic.     Right Ear: External ear normal.     Left Ear: External ear normal.     Mouth/Throat:     Mouth: Mucous membranes are moist.     Pharynx: Oropharynx is clear. No oropharyngeal exudate or posterior oropharyngeal erythema.  Eyes:     General: No scleral icterus.       Right eye:  No discharge.        Left eye: No discharge.     Extraocular Movements: Extraocular movements intact.     Conjunctiva/sclera: Conjunctivae normal.     Pupils: Pupils are equal, round, and reactive to light.  Cardiovascular:     Rate and Rhythm: Normal rate and regular rhythm.  Pulmonary:     Effort: Pulmonary effort is normal. No respiratory distress.     Breath sounds: Normal breath sounds.  Abdominal:     General: Bowel sounds are normal.     Tenderness: There is no abdominal tenderness. There is no guarding or rebound.  Musculoskeletal:     Cervical back: No rigidity or tenderness.  Skin:    General: Skin is warm and dry.  Neurological:     Mental Status: He is alert and oriented to person, place, and time.   Psychiatric:        Mood and Affect: Mood normal.        Behavior: Behavior normal.       11/18/2022   10:36 AM 11/18/2022   10:30 AM 06/28/2022   11:37 AM  Depression screen PHQ 2/9  Decreased Interest 0 0 0  Down, Depressed, Hopeless 0 0 0  PHQ - 2 Score 0 0 0  Altered sleeping 0    Tired, decreased energy 0    Change in appetite 0    Feeling bad or failure about yourself  0    Trouble concentrating 0    Moving slowly or fidgety/restless 0    Suicidal thoughts 0    PHQ-9 Score 0    Difficult doing work/chores Not difficult at all         No results found for any visits on 05/20/23.    The 10-year ASCVD risk score (Arnett DK, et al., 2019) is: 28.6%    Assessment & Plan:   Essential hypertension -     CBC with Differential/Platelet -     Comprehensive metabolic panel -     Urinalysis, Routine w reflex microscopic  Elevated LDL cholesterol level -     Comprehensive metabolic panel -     Lipid panel  Tobacco use  History of gout -     Uric acid -     Allopurinol; Take 1 tablet (100 mg total) by mouth daily.  Dispense: 90 tablet; Refill: 3  Mixed hyperlipidemia -     Comprehensive metabolic panel -     Lipid panel    Return in about 6 months (around 11/18/2023), or if symptoms worsen or fail to improve.  Not interested in giving up his cigars but information on quitting smoking was given.  Please take medicines as directed especially the allopurinol and needs to be taken daily.  Mliss Sax, MD

## 2023-05-20 NOTE — Progress Notes (Signed)
Patient name: Roger Stewart MRN: [id number] DOB: March 03, 1946 Sex: male  REASON FOR CONSULT: 6 month follow-up + ABI for right lower extremity claudication  HPI: Roger Stewart is a 77 y.o. male, with history of hypertension and hyperlipidemia that presents for 6 month follow-up of claudication in his right leg.  He was initially seen with burning in the right calf after walking about 5 to 10 minutes.  He spends about half his year in [place].  His symptoms were worsening over time and could not play tennis.  We previously had planned right leg bypass but this was canceled after he saw some improvement.  Today he states his right leg is doing about the same if not better.  He recently walked just over a mile and a half to the [facility] baseball game without any significant symptoms.  Can usually walk at least 1/3 of a mile before he has to slow down.  Not smoking other than intermittent cigars.  Past Medical History:  Diagnosis Date   Cancer (HCC)    Basal Cell X 8; Dr Margo Aye   Colon polyps    Dr Madilyn Fireman   Diverticulosis of colon    Hyperlipidemia    elevated TG   Hypertension    Sciatica     Past Surgical History:  Procedure Laterality Date   ABDOMINAL AORTIC ANEURYSM REPAIR  2005   ABDOMINAL AORTOGRAM W/LOWER EXTREMITY Right 01/24/2022   Procedure: ABDOMINAL AORTOGRAM W/LOWER EXTREMITY;  Surgeon: Cephus Shelling, MD;  Location: [facility];  Service: Cardiovascular;  Laterality: Right;   colonoscopy with polypectomy  2010;2016   Dr Madilyn Fireman   [facility] X3     LS spine   LUMBAR SPINE SURGERY  04/2010   spur & bulging disc L 4-5, [place] , [place]   LUMBAR SPINE SURGERY  08/2010   cyst resected @ op site    Family History  Problem Relation Age of Onset   Heart disease Mother        CHF ,CABG   Prostate cancer Father    Hypertension Father    Transient ischemic attack Father        in 10s   Hypertension Brother    Acromegaly Brother    Prostate cancer Brother    Leukemia Brother     Diabetes Neg Hx     SOCIAL HISTORY: Social History   Socioeconomic History   Marital status: Legally Separated    Spouse name: Not on file   Number of children: 1   Years of education: 16   Highest education level: Not on file  Occupational History   Not on file  Tobacco Use   Smoking status: Every Day    Types: Cigars    Last attempt to quit: 06/10/2000    Years since quitting: 22.9   Smokeless tobacco: Never   Tobacco comments:     Smoked [phone], up to 3/4 ppd. 05/22/15 1 cigar a day. 10/26/19 2-4 cigars daily  Vaping Use   Vaping status: Never Used  Substance and Sexual Activity   Alcohol use: Yes    Alcohol/week: 16.0 standard drinks of alcohol    Types: 2 Glasses of wine, 14 Cans of beer per week    Comment: daily   Drug use: No   Sexual activity: Not Currently  Other Topics Concern   Not on file  Social History Narrative   Denies abuse and feels safe at home.    Social Determinants of Health  Financial Resource Strain: Low Risk  (06/28/2022)   Overall Financial Resource Strain (CARDIA)    Difficulty of Paying Living Expenses: Not hard at all  Food Insecurity: No Food Insecurity (06/28/2022)   Hunger Vital Sign    Worried About Running Out of Food in the Last Year: Never true    Ran Out of Food in the Last Year: Never true  Transportation Needs: No Transportation Needs (06/28/2022)   PRAPARE - Administrator, Civil Service (Medical): No    Lack of Transportation (Non-Medical): No  Physical Activity: Insufficiently Active (06/28/2022)   Exercise Vital Sign    Days of Exercise per Week: 3 days    Minutes of Exercise per Session: 30 min  Stress: No Stress Concern Present (06/28/2022)   [employer] of Occupational Health - Occupational Stress Questionnaire    Feeling of Stress : Not at all  Social Connections: Moderately Isolated (06/06/2021)   Social Connection and Isolation Panel [NHANES]    Frequency of Communication with Friends and  Family: Twice a week    Frequency of Social Gatherings with Friends and Family: Twice a week    Attends Religious Services: Never    Database administrator or Organizations: Yes    Attends Engineer, structural: More than 4 times per year    Marital Status: Separated  Intimate Partner Violence: Not At Risk (06/06/2021)   Humiliation, Afraid, Rape, and Kick questionnaire    Fear of Current or Ex-Partner: No    Emotionally Abused: No    Physically Abused: No    Sexually Abused: No    Allergies  Allergen Reactions   Norvasc [Amlodipine Besylate] Other (See Comments)    Body aches    Current Outpatient Medications  Medication Sig Dispense Refill   allopurinol (ZYLOPRIM) 100 MG tablet Take 1 tablet (100 mg total) by mouth daily. 90 tablet 3   atorvastatin (LIPITOR) 20 MG tablet TAKE 1 TABLET BY MOUTH EVERY DAY 90 tablet 3   cetirizine (ZYRTEC) 10 MG tablet Take 10 mg by mouth daily as needed for allergies.     cilostazol (PLETAL) 100 MG tablet TAKE 1 TABLET BY MOUTH TWICE A DAY 180 tablet 1   Coenzyme Q10-Vitamin E (QUNOL ULTRA COQ10 PO) Take 1 capsule by mouth in the morning.     metoprolol tartrate (LOPRESSOR) 25 MG tablet TAKE 1 TABLET BY MOUTH EVERY DAY 90 tablet 1   Misc Natural Products (BRAINSTRONG MEMORY SUPPORT) TABS Take 1 tablet by mouth daily.     Misc Natural Products (PROSTATE SUPPORT PO) Take 1 capsule by mouth in the morning.     Multiple Vitamin (MULTIVITAMIN WITH MINERALS) TABS tablet Take 1 tablet by mouth daily.     OVER THE COUNTER MEDICATION Take 1 capsule by mouth in the morning. Nitric Oxide Supplement     Polyethyl Glycol-Propyl Glycol (LUBRICANT EYE DROPS) 0.4-0.3 % SOLN Place 1-2 drops into both eyes 3 (three) times daily as needed (dry/irritated eyes.).     No current facility-administered medications for this visit.    REVIEW OF SYSTEMS:  [X]  denotes positive finding, [ ]  denotes negative finding Cardiac  Comments:  Chest pain or chest pressure:     Shortness of breath upon exertion:    Short of breath when lying flat:    Irregular heart rhythm:        Vascular    Pain in calf, thigh, or hip brought on by ambulation: x Right  Pain in feet at  night that wakes you up from your sleep:     Blood clot in your veins:    Leg swelling:         Pulmonary    Oxygen at home:    Productive cough:     Wheezing:         Neurologic    Sudden weakness in arms or legs:     Sudden numbness in arms or legs:     Sudden onset of difficulty speaking or slurred speech:    Temporary loss of vision in one eye:     Problems with dizziness:         Gastrointestinal    Blood in stool:     Vomited blood:         Genitourinary    Burning when urinating:     Blood in urine:        Psychiatric    Major depression:         Hematologic    Bleeding problems:    Problems with blood clotting too easily:        Skin    Rashes or ulcers:        Constitutional    Fever or chills:      PHYSICAL EXAM: Vitals:   05/20/23 1447 05/20/23 1450  BP:  (!) 103/59  Pulse:  86  Resp:  18  Temp:  97.9 F (36.6 C)  TempSrc:  Temporal  SpO2:  92%  Weight: 194 lb (88 kg)   Height: 6' (1.829 m)      GENERAL: The patient is a well-nourished male, in no acute distress. The vital signs are documented above. CARDIAC: There is a regular rate and rhythm.  VASCULAR:  Palpable femoral pulse bilaterally Left dorsalis pedis and posterior tibial pulse palpable No palpable right pedal pulses No lower extremity tissue loss PULMONARY: No respiratory  ABDOMEN: Soft and non-tender. MUSCULOSKELETAL: There are no major deformities or cyanosis. NEUROLOGIC: No focal weakness or paresthesias are detected. SKIN: There are no ulcers or rashes noted. PSYCHIATRIC: The patient has a normal affect.  DATA:   ABI's today 0.85 right monophasic and 1.33 left triphasic  Assessment/Plan:  77 year old male presents for 6 month interval follow-up of his PAD with right  lower extremity claudication.   We were previously planning right leg bypass for flush SFA occlusion but ultimately this was canceled after he saw some improvement in his right leg.  On 72-month follow-up today he is continuing to do well.  Still has some intermittent symptoms but there is no lifestyle limitation.  Still able to play golf and going to [facility] sporting events which he enjoys.  ABI slightly improved today on the right 0.85.  Continue medical management with walking therapies and Pletal.  I will see in 6 months.  No indication for surgical bypass with him doing well.  Cephus Shelling, MD [facility] [place] Office: [phone]  Cephus Shelling

## 2023-05-27 ENCOUNTER — Other Ambulatory Visit: Payer: Self-pay

## 2023-05-27 DIAGNOSIS — I739 Peripheral vascular disease, unspecified: Secondary | ICD-10-CM

## 2023-06-30 DIAGNOSIS — M5451 Vertebrogenic low back pain: Secondary | ICD-10-CM | POA: Diagnosis not present

## 2023-06-30 DIAGNOSIS — M9904 Segmental and somatic dysfunction of sacral region: Secondary | ICD-10-CM | POA: Diagnosis not present

## 2023-06-30 DIAGNOSIS — M9903 Segmental and somatic dysfunction of lumbar region: Secondary | ICD-10-CM | POA: Diagnosis not present

## 2023-06-30 DIAGNOSIS — M62830 Muscle spasm of back: Secondary | ICD-10-CM | POA: Diagnosis not present

## 2023-07-05 ENCOUNTER — Other Ambulatory Visit: Payer: Self-pay | Admitting: Family Medicine

## 2023-07-05 DIAGNOSIS — I739 Peripheral vascular disease, unspecified: Secondary | ICD-10-CM

## 2023-08-11 DIAGNOSIS — M9904 Segmental and somatic dysfunction of sacral region: Secondary | ICD-10-CM | POA: Diagnosis not present

## 2023-08-11 DIAGNOSIS — M5451 Vertebrogenic low back pain: Secondary | ICD-10-CM | POA: Diagnosis not present

## 2023-08-11 DIAGNOSIS — M9903 Segmental and somatic dysfunction of lumbar region: Secondary | ICD-10-CM | POA: Diagnosis not present

## 2023-08-11 DIAGNOSIS — M62830 Muscle spasm of back: Secondary | ICD-10-CM | POA: Diagnosis not present

## 2023-09-01 DIAGNOSIS — M5451 Vertebrogenic low back pain: Secondary | ICD-10-CM | POA: Diagnosis not present

## 2023-09-01 DIAGNOSIS — M9904 Segmental and somatic dysfunction of sacral region: Secondary | ICD-10-CM | POA: Diagnosis not present

## 2023-09-01 DIAGNOSIS — M9903 Segmental and somatic dysfunction of lumbar region: Secondary | ICD-10-CM | POA: Diagnosis not present

## 2023-09-01 DIAGNOSIS — M62830 Muscle spasm of back: Secondary | ICD-10-CM | POA: Diagnosis not present

## 2023-09-07 ENCOUNTER — Other Ambulatory Visit: Payer: Self-pay | Admitting: Family

## 2023-09-07 DIAGNOSIS — I10 Essential (primary) hypertension: Secondary | ICD-10-CM

## 2023-09-09 DIAGNOSIS — M7989 Other specified soft tissue disorders: Secondary | ICD-10-CM | POA: Diagnosis not present

## 2023-09-09 DIAGNOSIS — M25532 Pain in left wrist: Secondary | ICD-10-CM | POA: Diagnosis not present

## 2023-09-09 DIAGNOSIS — M1812 Unilateral primary osteoarthritis of first carpometacarpal joint, left hand: Secondary | ICD-10-CM | POA: Diagnosis not present

## 2023-09-18 ENCOUNTER — Other Ambulatory Visit: Payer: Self-pay | Admitting: Family Medicine

## 2023-09-18 ENCOUNTER — Other Ambulatory Visit: Payer: Self-pay

## 2023-09-18 ENCOUNTER — Telehealth: Payer: Self-pay | Admitting: Family Medicine

## 2023-09-18 DIAGNOSIS — I10 Essential (primary) hypertension: Secondary | ICD-10-CM

## 2023-09-18 MED ORDER — METOPROLOL TARTRATE 25 MG PO TABS: 25.0000 mg | ORAL_TABLET | Freq: Every day | ORAL | 1 refills | Status: DC

## 2023-09-18 NOTE — Telephone Encounter (Signed)
 Copied from CRM [id number]. Topic: Clinical - Medication Refill >> Sep 18, 2023  3:32 PM Saverio Danker wrote: Most Recent Primary Care Visit:  Provider: Mliss Sax  Department: [facility]  Visit Type: OFFICE VISIT  Date: 05/20/2023  Medication: metoprolol tartrate (LOPRESSOR) 25 MG tablet  Has the patient contacted their pharmacy? Yes (Agent: If no, request that the patient contact the pharmacy for the refill. If patient does not wish to contact the pharmacy document the reason why and proceed with request.) (Agent: If yes, when and what did the pharmacy advise?)  Is this the correct pharmacy for this prescription? Yes If no, delete pharmacy and type the correct one.  This is the patient's preferred pharmacy:  [id number], [place] - [address] [address] Phone: [phone] Fax: [phone]    Has the prescription been filled recently? No  Is the patient out of the medication? No  Has the patient been seen for an appointment in the last year OR does the patient have an upcoming appointment? Yes  Can we respond through MyChart? Yes  Agent: Please be advised that Rx refills may take up to 3 business days. We ask that you follow-up with your pharmacy.

## 2023-09-18 NOTE — Telephone Encounter (Signed)
 Prescription Request  09/18/2023  LOV: 05/20/2023  What is the name of the medication or equipment? metoprolol tartrate (LOPRESSOR) 25 MG tablet [[id number]]   Have you contacted your pharmacy to request a refill? No   Which pharmacy would you like this sent to?     [id number], [place] - [address] [place] [address] Phone: [phone] Fax: [phone]   Patient notified that their request is being sent to the clinical staff for review and that they should receive a response within 2 business days.   Please advise at Mobile [phone] (mobile)

## 2023-09-30 ENCOUNTER — Other Ambulatory Visit: Payer: Self-pay | Admitting: Family Medicine

## 2023-09-30 DIAGNOSIS — E7800 Pure hypercholesterolemia, unspecified: Secondary | ICD-10-CM

## 2023-10-27 DIAGNOSIS — Z720 Tobacco use: Secondary | ICD-10-CM | POA: Diagnosis not present

## 2023-10-27 DIAGNOSIS — M109 Gout, unspecified: Secondary | ICD-10-CM | POA: Diagnosis not present

## 2023-10-27 DIAGNOSIS — E785 Hyperlipidemia, unspecified: Secondary | ICD-10-CM | POA: Diagnosis not present

## 2023-10-27 DIAGNOSIS — I10 Essential (primary) hypertension: Secondary | ICD-10-CM | POA: Diagnosis not present

## 2023-10-27 DIAGNOSIS — I70209 Unspecified atherosclerosis of native arteries of extremities, unspecified extremity: Secondary | ICD-10-CM | POA: Diagnosis not present

## 2023-10-27 DIAGNOSIS — Z8249 Family history of ischemic heart disease and other diseases of the circulatory system: Secondary | ICD-10-CM | POA: Diagnosis not present

## 2023-10-27 DIAGNOSIS — Z809 Family history of malignant neoplasm, unspecified: Secondary | ICD-10-CM | POA: Diagnosis not present

## 2023-10-27 DIAGNOSIS — M5430 Sciatica, unspecified side: Secondary | ICD-10-CM | POA: Diagnosis not present

## 2023-10-27 DIAGNOSIS — R001 Bradycardia, unspecified: Secondary | ICD-10-CM | POA: Diagnosis not present

## 2023-10-27 DIAGNOSIS — M1990 Unspecified osteoarthritis, unspecified site: Secondary | ICD-10-CM | POA: Diagnosis not present

## 2023-10-27 DIAGNOSIS — Z8701 Personal history of pneumonia (recurrent): Secondary | ICD-10-CM | POA: Diagnosis not present

## 2023-10-27 DIAGNOSIS — Z008 Encounter for other general examination: Secondary | ICD-10-CM | POA: Diagnosis not present

## 2023-10-27 DIAGNOSIS — Z85828 Personal history of other malignant neoplasm of skin: Secondary | ICD-10-CM | POA: Diagnosis not present

## 2023-11-18 ENCOUNTER — Ambulatory Visit (HOSPITAL_COMMUNITY)
Admission: RE | Admit: 2023-11-18 | Discharge: 2023-11-18 | Disposition: A | Discharge status: Home / Self Care | Payer: Medicare HMO | Source: Ambulatory Visit | Attending: Vascular Surgery | Admitting: Vascular Surgery

## 2023-11-18 ENCOUNTER — Encounter: Payer: Self-pay | Admitting: Vascular Surgery

## 2023-11-18 ENCOUNTER — Ambulatory Visit: Payer: Medicare HMO | Admitting: Vascular Surgery

## 2023-11-18 VITALS — BP 144/93 | HR 64 | Temp 98.0°F | Resp 18 | Ht 72.0 in | Wt 193.9 lb

## 2023-11-18 DIAGNOSIS — I739 Peripheral vascular disease, unspecified: Secondary | ICD-10-CM | POA: Diagnosis not present

## 2023-11-18 LAB — VAS US ABI WITH/WO TBI
Left ABI: 1.04
Right ABI: 0.73

## 2023-11-18 NOTE — Progress Notes (Signed)
 Patient name: Roger Stewart MRN: [id number] DOB: 03/11/1946 Sex: male  REASON FOR CONSULT: 6 month follow-up right lower extremity claudication  HPI: Roger Stewart is a 78 y.o. male, with history of hypertension and hyperlipidemia that presents for 6 month follow-up of claudication in his right leg.  He was initially seen with burning in the right calf after walking about 5 to 10 minutes.  He spends about half his year in [place] .  His symptoms were worsening over time and could not play tennis.  We previously had planned right leg bypass but this was canceled after he saw some improvement.  Today thinks his right leg is doing a little bit better.  States he can walk up to half a mile which is the best he has done yet since initial evaluation by vascular surgery.  Past Medical History:  Diagnosis Date   Cancer (HCC)    Basal Cell X 8; Dr Del Favia   Colon polyps    Dr Sabra Cramp   Diverticulosis of colon    Hyperlipidemia    elevated TG   Hypertension    Peripheral vascular disease [facility])    Sciatica     Past Surgical History:  Procedure Laterality Date   ABDOMINAL AORTIC ANEURYSM REPAIR  2005   ABDOMINAL AORTOGRAM W/LOWER EXTREMITY Right 01/24/2022   Procedure: ABDOMINAL AORTOGRAM W/LOWER EXTREMITY;  Surgeon: Young Hensen, MD;  Location: [facility];  Service: Cardiovascular;  Laterality: Right;   colonoscopy with polypectomy  2010;2016   Dr Sabra Cramp   [facility] X3     LS spine   LUMBAR SPINE SURGERY  04/2010   spur & bulging disc L 4-5, [place] , [place]   LUMBAR SPINE SURGERY  08/2010   cyst resected @ op site    Family History  Problem Relation Age of Onset   Heart disease Mother        CHF ,CABG   Prostate cancer Father    Hypertension Father    Transient ischemic attack Father        in 96s   Hypertension Brother    Acromegaly Brother    Prostate cancer Brother    Leukemia Brother    Diabetes Neg Hx     SOCIAL HISTORY: Social History   Socioeconomic History    Marital status: Legally Separated    Spouse name: Not on file   Number of children: 1   Years of education: 16   Highest education level: Not on file  Occupational History   Not on file  Tobacco Use   Smoking status: Every Day    Types: Cigars    Last attempt to quit: 06/10/2000    Years since quitting: 23.4   Smokeless tobacco: Never   Tobacco comments:     Smoked [phone], up to 3/4 ppd. 05/22/15 1 cigar a day. 10/26/19 2-4 cigars daily  Vaping Use   Vaping status: Never Used  Substance and Sexual Activity   Alcohol use: Yes    Alcohol/week: 16.0 standard drinks of alcohol    Types: 2 Glasses of wine, 14 Cans of beer per week    Comment: daily   Drug use: No   Sexual activity: Not Currently  Other Topics Concern   Not on file  Social History Narrative   Denies abuse and feels safe at home.    Social Drivers of Health   Financial Resource Strain: Low Risk  (06/28/2022)   Overall Financial Resource Strain (CARDIA)  Difficulty of Paying Living Expenses: Not hard at all  Food Insecurity: No Food Insecurity (06/28/2022)   Hunger Vital Sign    Worried About Running Out of Food in the Last Year: Never true    Ran Out of Food in the Last Year: Never true  Transportation Needs: No Transportation Needs (06/28/2022)   PRAPARE - Administrator, Civil Service (Medical): No    Lack of Transportation (Non-Medical): No  Physical Activity: Insufficiently Active (06/28/2022)   Exercise Vital Sign    Days of Exercise per Week: 3 days    Minutes of Exercise per Session: 30 min  Stress: No Stress Concern Present (06/28/2022)   [employer] of Occupational Health - Occupational Stress Questionnaire    Feeling of Stress : Not at all  Social Connections: Moderately Isolated (06/06/2021)   Social Connection and Isolation Panel [NHANES]    Frequency of Communication with Friends and Family: Twice a week    Frequency of Social Gatherings with Friends and Family: Twice a week     Attends Religious Services: Never    Database administrator or Organizations: Yes    Attends Engineer, structural: More than 4 times per year    Marital Status: Separated  Intimate Partner Violence: Not At Risk (06/06/2021)   Humiliation, Afraid, Rape, and Kick questionnaire    Fear of Current or Ex-Partner: No    Emotionally Abused: No    Physically Abused: No    Sexually Abused: No    Allergies  Allergen Reactions   Norvasc  [Amlodipine  Besylate] Other (See Comments)    Body aches    Current Outpatient Medications  Medication Sig Dispense Refill   allopurinol  (ZYLOPRIM ) 100 MG tablet Take 1 tablet (100 mg total) by mouth daily. 90 tablet 3   atorvastatin  (LIPITOR) 20 MG tablet TAKE 1 TABLET BY MOUTH EVERY DAY 90 tablet 3   cetirizine (ZYRTEC) 10 MG tablet Take 10 mg by mouth daily as needed for allergies.     cilostazol  (PLETAL ) 100 MG tablet TAKE 1 TABLET BY MOUTH TWICE A DAY 180 tablet 1   Coenzyme Q10-Vitamin E (QUNOL ULTRA COQ10 PO) Take 1 capsule by mouth in the morning.     metoprolol  tartrate (LOPRESSOR ) 25 MG tablet Take 1 tablet (25 mg total) by mouth daily. 90 tablet 1   Misc Natural Products (BRAINSTRONG MEMORY SUPPORT) TABS Take 1 tablet by mouth daily.     Misc Natural Products (PROSTATE SUPPORT PO) Take 1 capsule by mouth in the morning.     Multiple Vitamin (MULTIVITAMIN WITH MINERALS) TABS tablet Take 1 tablet by mouth daily.     OVER THE COUNTER MEDICATION Take 1 capsule by mouth in the morning. Nitric Oxide Supplement     Polyethyl Glycol-Propyl Glycol (LUBRICANT EYE DROPS) 0.4-0.3 % SOLN Place 1-2 drops into both eyes 3 (three) times daily as needed (dry/irritated eyes.).     No current facility-administered medications for this visit.    REVIEW OF SYSTEMS:  [X]  denotes positive finding, [ ]  denotes negative finding Cardiac  Comments:  Chest pain or chest pressure:    Shortness of breath upon exertion:    Short of breath when lying flat:     Irregular heart rhythm:        Vascular    Pain in calf, thigh, or hip brought on by ambulation: x Right  Pain in feet at night that wakes you up from your sleep:     Blood clot in  your veins:    Leg swelling:         Pulmonary    Oxygen at home:    Productive cough:     Wheezing:         Neurologic    Sudden weakness in arms or legs:     Sudden numbness in arms or legs:     Sudden onset of difficulty speaking or slurred speech:    Temporary loss of vision in one eye:     Problems with dizziness:         Gastrointestinal    Blood in stool:     Vomited blood:         Genitourinary    Burning when urinating:     Blood in urine:        Psychiatric    Major depression:         Hematologic    Bleeding problems:    Problems with blood clotting too easily:        Skin    Rashes or ulcers:        Constitutional    Fever or chills:      PHYSICAL EXAM: Vitals:   11/18/23 1422  BP: (!) 144/93  Pulse: 64  Resp: 18  Temp: 98 F (36.7 C)  TempSrc: Temporal  SpO2: 95%  Weight: 193 lb 14.4 oz (88 kg)  Height: 6' (1.829 m)     GENERAL: The patient is a well-nourished male, in no acute distress. The vital signs are documented above. CARDIAC: There is a regular rate and rhythm.  VASCULAR:  Palpable femoral pulse bilaterally Left dorsalis pedis and posterior tibial pulse palpable No palpable right pedal pulses No lower extremity tissue loss PULMONARY: No respiratory  ABDOMEN: Soft and non-tender. MUSCULOSKELETAL: There are no major deformities or cyanosis. NEUROLOGIC: No focal weakness or paresthesias are detected. SKIN: There are no ulcers or rashes noted. PSYCHIATRIC: The patient has a normal affect.  DATA:   ABI's today 0.73 right biphasic and 1.04 left triphasic  (previously 0.85 right monophasic and 1.33 left triphasic)  Assessment/Plan:  78 year old male presents for 6 month interval follow-up of his PAD with right lower extremity claudication.   We  were previously planning right leg bypass for flush SFA occlusion but ultimately this was canceled after he saw some improvement in his right leg (angiogram was done 01/24/22).  On 19-month follow-up today he is continuing to do well.  Now walking up to half a mile which is significant improvement.  Doing well with conservative therapy.  Tolerating his Pletal .  Again we will delay any intervention.  I will see him in 1 year with repeat ABIs.  Discussed he call with questions or concerns.  Remains on pletal  and doing an excellent job with walking therapy.   Young Hensen, MD [facility] Office: [phone]  Young Hensen

## 2023-12-08 DIAGNOSIS — M9903 Segmental and somatic dysfunction of lumbar region: Secondary | ICD-10-CM | POA: Diagnosis not present

## 2023-12-08 DIAGNOSIS — S336XXA Sprain of sacroiliac joint, initial encounter: Secondary | ICD-10-CM | POA: Diagnosis not present

## 2023-12-08 DIAGNOSIS — M9904 Segmental and somatic dysfunction of sacral region: Secondary | ICD-10-CM | POA: Diagnosis not present

## 2023-12-08 DIAGNOSIS — M9905 Segmental and somatic dysfunction of pelvic region: Secondary | ICD-10-CM | POA: Diagnosis not present

## 2023-12-08 DIAGNOSIS — S39013A Strain of muscle, fascia and tendon of pelvis, initial encounter: Secondary | ICD-10-CM | POA: Diagnosis not present

## 2023-12-08 DIAGNOSIS — S39012A Strain of muscle, fascia and tendon of lower back, initial encounter: Secondary | ICD-10-CM | POA: Diagnosis not present

## 2023-12-09 DIAGNOSIS — M9905 Segmental and somatic dysfunction of pelvic region: Secondary | ICD-10-CM | POA: Diagnosis not present

## 2023-12-09 DIAGNOSIS — M9903 Segmental and somatic dysfunction of lumbar region: Secondary | ICD-10-CM | POA: Diagnosis not present

## 2023-12-09 DIAGNOSIS — S39013A Strain of muscle, fascia and tendon of pelvis, initial encounter: Secondary | ICD-10-CM | POA: Diagnosis not present

## 2023-12-09 DIAGNOSIS — M9904 Segmental and somatic dysfunction of sacral region: Secondary | ICD-10-CM | POA: Diagnosis not present

## 2023-12-09 DIAGNOSIS — S336XXA Sprain of sacroiliac joint, initial encounter: Secondary | ICD-10-CM | POA: Diagnosis not present

## 2023-12-09 DIAGNOSIS — S39012A Strain of muscle, fascia and tendon of lower back, initial encounter: Secondary | ICD-10-CM | POA: Diagnosis not present

## 2024-01-16 ENCOUNTER — Ambulatory Visit

## 2024-01-16 VITALS — BP 110/60 | HR 72 | Temp 97.7°F | Ht 72.0 in | Wt 185.8 lb

## 2024-01-16 DIAGNOSIS — Z0000 Encounter for general adult medical examination without abnormal findings: Secondary | ICD-10-CM | POA: Diagnosis not present

## 2024-01-16 NOTE — Patient Instructions (Signed)
 Roger Stewart , Thank you for taking time out of your busy schedule to complete your Annual Wellness Visit with me. I enjoyed our conversation and look forward to speaking with you again next year. I, as well as your care team,  appreciate your ongoing commitment to your health goals. Please review the following plan we discussed and let me know if I can assist you in the future. Your Game plan/ To Do List    Referrals: If you haven't heard from the office you've been referred to, please reach out to them at the phone provided.   Follow up Visits: We will see or speak with you next year for your Next Medicare AWV with our clinical staff Have you seen your provider in the last 6 months (3 months if uncontrolled diabetes)? No  Clinician Recommendations:  Aim for 30 minutes of exercise or brisk walking, 6-8 glasses of water, and 5 servings of fruits and vegetables each day.       This is a list of the screenings recommended for you:  Health Maintenance  Topic Date Due   Zoster (Shingles) Vaccine (2 of 2) 04/06/2019   Flu Shot  01/09/2024   Medicare Annual Wellness Visit  01/15/2025   Colon Cancer Screening  02/09/2025   Hepatitis C Screening  Completed   Hepatitis B Vaccine  Aged Out   HPV Vaccine  Aged Out   Meningitis B Vaccine  Aged Out   DTaP/Tdap/Td vaccine  Discontinued   Pneumococcal Vaccine for age over 24  Discontinued   COVID-19 Vaccine  Discontinued    Advanced directives: (Copy Requested) Please bring a copy of your health care power of attorney and living will to the office to be added to your chart at your convenience. You can mail to [facility] [address] [place], [place] [address] or email to [email or web address] Advance Care Planning is important because it:  [x]  Makes sure you receive the medical care that is consistent with your values, goals, and preferences  [x]  It provides guidance to your family and loved ones and reduces their decisional  burden about whether or not they are making the right decisions based on your wishes.  Follow the link provided in your after visit summary or read over the paperwork we have mailed to you to help you started getting your Advance Directives in place. If you need assistance in completing these, please reach out to us  so that we can help you!  See attachments for Preventive Care and Fall Prevention Tips.

## 2024-01-16 NOTE — Progress Notes (Signed)
 Subjective:   Roger Stewart is a 78 y.o. who presents for a Medicare Wellness preventive visit.  As a reminder, Annual Wellness Visits don't include a physical exam, and some assessments may be limited, especially if this visit is performed virtually. We may recommend an in-person follow-up visit with your provider if needed.  Visit Complete: In person    Persons Participating in Visit: Patient.  AWV Questionnaire: No: Patient Medicare AWV questionnaire was not completed prior to this visit.  Cardiac Risk Factors include: advanced age (>57men, >31 women);dyslipidemia;male gender;hypertension     Objective:    Today's Vitals   01/16/24 0911  BP: 110/60  Pulse: 72  Temp: 97.7 F (36.5 C)  TempSrc: Oral  SpO2: 95%  Weight: 185 lb 12.8 oz (84.3 kg)  Height: 6' (1.829 m)   Body mass index is 25.2 kg/m.     01/16/2024    9:19 AM 06/28/2022   11:37 AM 01/24/2022   10:26 AM 06/06/2021   12:54 PM 06/06/2021   12:49 PM 12/29/2019   10:47 AM  Advanced Directives  Does Patient Have a Medical Advance Directive? Yes Yes Yes Yes Yes Yes  Type of Estate agent of [place];Living will Healthcare Power of [place];Living will Healthcare Power of [place];Living will Healthcare Power of [place];Living will Healthcare Power of [place];Living will Healthcare Power of [place];Living will  Does patient want to make changes to medical advance directive?   No - Patient declined     Copy of Healthcare Power of Attorney in Chart? No - copy requested No - copy requested No - copy requested No - copy requested No - copy requested No - copy requested    Current Medications (verified) Outpatient Encounter Medications as of 01/16/2024  Medication Sig   allopurinol  (ZYLOPRIM ) 100 MG tablet Take 1 tablet (100 mg total) by mouth daily.   atorvastatin  (LIPITOR) 20 MG tablet TAKE 1 TABLET BY MOUTH EVERY DAY   cilostazol  (PLETAL ) 100 MG tablet TAKE 1 TABLET BY MOUTH TWICE A DAY    Coenzyme Q10-Vitamin E (QUNOL ULTRA COQ10 PO) Take 1 capsule by mouth in the morning.   diphenhydrAMINE (BENADRYL) 25 mg capsule Take 25 mg by mouth daily.   metoprolol  tartrate (LOPRESSOR ) 25 MG tablet Take 1 tablet (25 mg total) by mouth daily.   Multiple Vitamin (MULTIVITAMIN WITH MINERALS) TABS tablet Take 1 tablet by mouth daily.   Polyethyl Glycol-Propyl Glycol (LUBRICANT EYE DROPS) 0.4-0.3 % SOLN Place 1-2 drops into both eyes 3 (three) times daily as needed (dry/irritated eyes.).   cetirizine (ZYRTEC) 10 MG tablet Take 10 mg by mouth daily as needed for allergies. (Patient not taking: Reported on 01/16/2024)   Misc Natural Products (BRAINSTRONG MEMORY SUPPORT) TABS Take 1 tablet by mouth daily. (Patient not taking: Reported on 01/16/2024)   Misc Natural Products (PROSTATE SUPPORT PO) Take 1 capsule by mouth in the morning. (Patient not taking: Reported on 01/16/2024)   OVER THE COUNTER MEDICATION Take 1 capsule by mouth in the morning. Nitric Oxide Supplement (Patient not taking: Reported on 01/16/2024)   No facility-administered encounter medications on file as of 01/16/2024.    Allergies (verified) Norvasc  [amlodipine  besylate]   History: Past Medical History:  Diagnosis Date   Cancer (HCC)    Basal Cell X 8; Dr Shona   Colon polyps    Dr Dyane   Diverticulosis of colon    Hyperlipidemia    elevated TG   Hypertension    Peripheral vascular disease (HCC)  Sciatica    Past Surgical History:  Procedure Laterality Date   ABDOMINAL AORTIC ANEURYSM REPAIR  2005   ABDOMINAL AORTOGRAM W/LOWER EXTREMITY Right 01/24/2022   Procedure: ABDOMINAL AORTOGRAM W/LOWER EXTREMITY;  Surgeon: Gretta Lonni PARAS, MD;  Location: [facility];  Service: Cardiovascular;  Laterality: Right;   colonoscopy with polypectomy  2010;2016   Dr Dyane   [facility] X3     LS spine   LUMBAR SPINE SURGERY  04/2010   spur & bulging disc L 4-5, [place] , [place]   LUMBAR SPINE SURGERY  08/2010   cyst resected @ op site    Family History  Problem Relation Age of Onset   Heart disease Mother        CHF ,CABG   Prostate cancer Father    Hypertension Father    Transient ischemic attack Father        in 18s   Hypertension Brother    Acromegaly Brother    Prostate cancer Brother    Leukemia Brother    Diabetes Neg Hx    Social History   Socioeconomic History   Marital status: Legally Separated    Spouse name: Not on file   Number of children: 1   Years of education: 16   Highest education level: Not on file  Occupational History   Not on file  Tobacco Use   Smoking status: Every Day    Types: Cigars    Last attempt to quit: 06/10/2000    Years since quitting: 23.6   Smokeless tobacco: Never   Tobacco comments:     Smoked [phone], up to 3/4 ppd. 05/22/15 1 cigar a day. 10/26/19 2-4 cigars daily  Vaping Use   Vaping status: Never Used  Substance and Sexual Activity   Alcohol use: Yes    Alcohol/week: 16.0 standard drinks of alcohol    Types: 2 Glasses of wine, 14 Cans of beer per week    Comment: daily   Drug use: No   Sexual activity: Not Currently  Other Topics Concern   Not on file  Social History Narrative   Denies abuse and feels safe at home.    Social Drivers of Corporate investment banker Strain: Low Risk  (01/16/2024)   Overall Financial Resource Strain (CARDIA)    Difficulty of Paying Living Expenses: Not hard at all  Food Insecurity: No Food Insecurity (01/16/2024)   Hunger Vital Sign    Worried About Running Out of Food in the Last Year: Never true    Ran Out of Food in the Last Year: Never true  Transportation Needs: No Transportation Needs (01/16/2024)   PRAPARE - Administrator, Civil Service (Medical): No    Lack of Transportation (Non-Medical): No  Physical Activity: Sufficiently Active (01/16/2024)   Exercise Vital Sign    Days of Exercise per Week: 7 days    Minutes of Exercise per Session: 30 min  Stress: No Stress Concern Present (01/16/2024)   [employer] of Occupational Health - Occupational Stress Questionnaire    Feeling of Stress: Not at all  Social Connections: Moderately Isolated (01/16/2024)   Social Connection and Isolation Panel    Frequency of Communication with Friends and Family: Three times a week    Frequency of Social Gatherings with Friends and Family: More than three times a week    Attends Religious Services: Never    Database administrator or Organizations: Yes    Attends Banker  Meetings: More than 4 times per year    Marital Status: Separated    Tobacco Counseling Ready to quit: Not Answered Counseling given: Not Answered Tobacco comments:  Smoked [phone], up to 3/4 ppd. 05/22/15 1 cigar a day. 10/26/19 2-4 cigars daily    Clinical Intake:  Pre-visit preparation completed: Yes  Pain : No/denies pain     Nutritional Status: BMI 25 -29 Overweight Nutritional Risks: None Diabetes: No  Lab Results  Component Value Date   HGBA1C 6.0 05/27/2018   HGBA1C 5.8 05/26/2017   HGBA1C 5.7 05/23/2016     How often do you need to have someone help you when you read instructions, pamphlets, or other written materials from your doctor or pharmacy?: 1 - Never  Interpreter Needed?: No  Information entered by :: NAllen LPN   Activities of Daily Living     01/16/2024    9:13 AM  In your present state of health, do you have any difficulty performing the following activities:  Hearing? 0  Vision? 0  Difficulty concentrating or making decisions? 1  Comment trouble with names  Walking or climbing stairs? 0  Dressing or bathing? 0  Doing errands, shopping? 0  Preparing Food and eating ? N  Using the Toilet? N  In the past six months, have you accidently leaked urine? N  Do you have problems with loss of bowel control? N  Managing your Medications? N  Managing your Finances? N  Housekeeping or managing your Housekeeping? N    Patient Care Team: Berneta Elsie Sayre, MD as PCP - General  (Family Medicine)  I have updated your Care Teams any recent Medical Services you may have received from other providers in the past year.     Assessment:   This is a routine wellness examination for Jarelle.  Hearing/Vision screen Hearing Screening - Comments:: Denies hearing issues Vision Screening - Comments:: Regular eye exams,    Goals Addressed             This Visit's Progress    Patient Stated       01/16/2024, wants to manage BP       Depression Screen     01/16/2024    9:21 AM 11/18/2022   10:36 AM 11/18/2022   10:30 AM 06/28/2022   11:37 AM 10/11/2021   11:13 AM 10/11/2021   10:41 AM 06/06/2021   12:51 PM  PHQ 2/9 Scores  PHQ - 2 Score 0 0 0 0 1 0 0  PHQ- 9 Score 0 0   4      Fall Risk     01/16/2024    9:20 AM 11/18/2022   10:30 AM 06/28/2022   11:37 AM 10/11/2021   10:41 AM 06/06/2021   12:50 PM  Fall Risk   Falls in the past year? 0 0 0 0 0  Number falls in past yr: 0 0 0 0 0  Injury with Fall? 0 0 0  0  Risk for fall due to : Medication side effect No Fall Risks Medication side effect    Follow up Falls evaluation completed;Falls prevention discussed Falls evaluation completed Falls prevention discussed;Education provided;Falls evaluation completed   Falls evaluation completed      Data saved with a previous flowsheet row definition    MEDICARE RISK AT HOME:  Medicare Risk at Home Any stairs in or around the home?: No If so, are there any without handrails?: No Home free of loose throw rugs in walkways, pet beds,  electrical cords, etc?: Yes Adequate lighting in your home to reduce risk of falls?: Yes Life alert?: No Use of a cane, walker or w/c?: No Grab bars in the bathroom?: No Shower chair or bench in shower?: No Elevated toilet seat or a handicapped toilet?: No  TIMED UP AND GO:  Was the test performed?  No  Cognitive Function: 6CIT completed        01/16/2024    9:21 AM 06/28/2022   11:39 AM  6CIT Screen  What Year? 0 points 0 points   What month? 0 points 0 points  What time? 0 points 3 points  Count back from 20 0 points 2 points  Months in reverse 0 points 0 points  Repeat phrase 2 points 0 points  Total Score 2 points 5 points    Immunizations Immunization History  Administered Date(s) Administered   Fluad Quad(high Dose 65+) 03/18/2019, 03/22/2020   Influenza, High Dose Seasonal PF 05/26/2017   Moderna Sars-Covid-2 Vaccination 08/09/2019, 09/06/2019   Td 06/11/1999, 05/10/2010   Zoster Recombinant(Shingrix) 02/09/2019    Screening Tests Health Maintenance  Topic Date Due   Zoster Vaccines- Shingrix (2 of 2) 04/06/2019   INFLUENZA VACCINE  01/09/2024   Medicare Annual Wellness (AWV)  01/15/2025   Colonoscopy  02/09/2025   Hepatitis C Screening  Completed   Hepatitis B Vaccines  Aged Out   HPV VACCINES  Aged Out   Meningococcal B Vaccine  Aged Out   DTaP/Tdap/Td  Discontinued   Pneumococcal Vaccine: 50+ Years  Discontinued   COVID-19 Vaccine  Discontinued    Health Maintenance  Health Maintenance Due  Topic Date Due   Zoster Vaccines- Shingrix (2 of 2) 04/06/2019   INFLUENZA VACCINE  01/09/2024   Health Maintenance Items Addressed: Declines vaccines  Additional Screening:  Vision Screening: Recommended annual ophthalmology exams for early detection of glaucoma and other disorders of the eye. Would you like a referral to an eye doctor? No    Dental Screening: Recommended annual dental exams for proper oral hygiene  Community Resource Referral / Chronic Care Management: CRR required this visit?  No   CCM required this visit?  No   Plan:    I have personally reviewed and noted the following in the patient's chart:   Medical and social history Use of alcohol, tobacco or illicit drugs  Current medications and supplements including opioid prescriptions. Patient is not currently taking opioid prescriptions. Functional ability and status Nutritional status Physical activity Advanced  directives List of other physicians Hospitalizations, surgeries, and ER visits in previous 12 months Vitals Screenings to include cognitive, depression, and falls Referrals and appointments  In addition, I have reviewed and discussed with patient certain preventive protocols, quality metrics, and best practice recommendations. A written personalized care plan for preventive services as well as general preventive health recommendations were provided to patient.   Ardella FORBES Dawn, LPN   06/10/7972   After Visit Summary: (In Person-Declined) Patient declined AVS at this time.  Notes: Nothing significant to report at this time.

## 2024-01-20 ENCOUNTER — Ambulatory Visit (INDEPENDENT_AMBULATORY_CARE_PROVIDER_SITE_OTHER): Admitting: Family Medicine

## 2024-01-20 ENCOUNTER — Ambulatory Visit: Payer: Self-pay | Admitting: Family Medicine

## 2024-01-20 ENCOUNTER — Encounter: Payer: Self-pay | Admitting: Family Medicine

## 2024-01-20 VITALS — BP 128/80 | HR 77 | Temp 96.8°F | Ht 72.0 in | Wt 190.8 lb

## 2024-01-20 DIAGNOSIS — I7090 Unspecified atherosclerosis: Secondary | ICD-10-CM | POA: Diagnosis not present

## 2024-01-20 DIAGNOSIS — Z7901 Long term (current) use of anticoagulants: Secondary | ICD-10-CM

## 2024-01-20 DIAGNOSIS — Z720 Tobacco use: Secondary | ICD-10-CM

## 2024-01-20 DIAGNOSIS — E782 Mixed hyperlipidemia: Secondary | ICD-10-CM | POA: Diagnosis not present

## 2024-01-20 DIAGNOSIS — L720 Epidermal cyst: Secondary | ICD-10-CM

## 2024-01-20 DIAGNOSIS — I10 Essential (primary) hypertension: Secondary | ICD-10-CM | POA: Diagnosis not present

## 2024-01-20 DIAGNOSIS — Z131 Encounter for screening for diabetes mellitus: Secondary | ICD-10-CM

## 2024-01-20 LAB — URINALYSIS, ROUTINE W REFLEX MICROSCOPIC
Bilirubin Urine: NEGATIVE
Hgb urine dipstick: NEGATIVE
Ketones, ur: NEGATIVE
Leukocytes,Ua: NEGATIVE
Nitrite: NEGATIVE
RBC / HPF: NONE SEEN (ref 0–?)
Specific Gravity, Urine: 1.01 (ref 1.000–1.030)
Total Protein, Urine: NEGATIVE
Urine Glucose: NEGATIVE
Urobilinogen, UA: 0.2 (ref 0.0–1.0)
pH: 7 (ref 5.0–8.0)

## 2024-01-20 LAB — LIPID PANEL
Cholesterol: 133 mg/dL (ref 0–200)
HDL: 71.1 mg/dL (ref >39.00)
LDL Cholesterol: 46 mg/dL (ref 0–99)
NonHDL: 62.23
Total CHOL/HDL Ratio: 2
Triglycerides: 83 mg/dL (ref 0.0–149.0)
VLDL: 16.6 mg/dL (ref 0.0–40.0)

## 2024-01-20 LAB — BASIC METABOLIC PANEL WITH GFR
BUN: 11 mg/dL (ref 6–23)
CO2: 31 meq/L (ref 19–32)
Calcium: 8.9 mg/dL (ref 8.4–10.5)
Chloride: 102 meq/L (ref 96–112)
Creatinine, Ser: 0.65 mg/dL (ref 0.40–1.50)
GFR: 90.01 mL/min (ref >60.00)
Glucose, Bld: 110 mg/dL — ABNORMAL HIGH (ref 70–99)
Potassium: 4.1 meq/L (ref 3.5–5.1)
Sodium: 138 meq/L (ref 135–145)

## 2024-01-20 LAB — HEMOGLOBIN A1C: Hgb A1c MFr Bld: 6.2 % (ref 4.6–6.5)

## 2024-01-20 NOTE — Progress Notes (Signed)
 Established Patient Office Visit   Subjective:  Patient ID: Roger Stewart, male    DOB: 08-Mar-1946  Age: 77 y.o. MRN: [id number]  Chief Complaint  Patient presents with   Medical Management of Chronic Issues    6 month follow up. Pt is fasting. No concerns. Pt states he has not received his Shingrix.     HPI Encounter Diagnoses  Name Primary?   Essential hypertension Yes   Mixed hyperlipidemia    Tobacco use    Chronic anticoagulation    Screening for diabetes mellitus    Atherosclerotic vascular disease    Epidermal inclusion cyst    For follow-up of above.  Continues atorvastatin  for mixed hyperlipidemia and peripheral vascular disease involving his lower extremities.  Continues Pletal  for claudication pains.  Denies hematuria, melena or hematochezia.  Continues to smoke cigars daily with 2 servings of alcohol daily.  Has a mass in his right upper back that has been present for years and also been stable.   Review of Systems  Constitutional: Negative.   HENT: Negative.    Eyes:  Negative for blurred vision, discharge and redness.  Respiratory: Negative.    Cardiovascular: Negative.   Gastrointestinal:  Negative for abdominal pain.  Genitourinary: Negative.   Musculoskeletal: Negative.  Negative for myalgias.  Skin:  Negative for rash.  Neurological:  Negative for tingling, loss of consciousness and weakness.  Endo/Heme/Allergies:  Negative for polydipsia.     Current Outpatient Medications:    allopurinol  (ZYLOPRIM ) 100 MG tablet, Take 1 tablet (100 mg total) by mouth daily., Disp: 90 tablet, Rfl: 3   atorvastatin  (LIPITOR) 20 MG tablet, TAKE 1 TABLET BY MOUTH EVERY DAY, Disp: 90 tablet, Rfl: 3   cetirizine (ZYRTEC) 10 MG tablet, Take 10 mg by mouth daily as needed for allergies., Disp: , Rfl:    cilostazol  (PLETAL ) 100 MG tablet, TAKE 1 TABLET BY MOUTH TWICE A DAY, Disp: 180 tablet, Rfl: 1   Coenzyme Q10-Vitamin E (QUNOL ULTRA COQ10 PO), Take 1 capsule by mouth in  the morning., Disp: , Rfl:    diphenhydrAMINE (BENADRYL) 25 mg capsule, Take 25 mg by mouth daily., Disp: , Rfl:    metoprolol  tartrate (LOPRESSOR ) 25 MG tablet, Take 1 tablet (25 mg total) by mouth daily., Disp: 90 tablet, Rfl: 1   Multiple Vitamin (MULTIVITAMIN WITH MINERALS) TABS tablet, Take 1 tablet by mouth daily., Disp: , Rfl:    Polyethyl Glycol-Propyl Glycol (LUBRICANT EYE DROPS) 0.4-0.3 % SOLN, Place 1-2 drops into both eyes 3 (three) times daily as needed (dry/irritated eyes.)., Disp: , Rfl:    Objective:     BP 128/80 (BP Location: Right Arm, Patient Position: Sitting, Cuff Size: Normal)   Pulse 77   Temp (!) 96.8 F (36 C) (Temporal)   Ht 6' (1.829 m)   Wt 190 lb 12.8 oz (86.5 kg)   SpO2 98%   BMI 25.88 kg/m  BP Readings from Last 3 Encounters:  01/20/24 128/80  01/16/24 110/60  11/18/23 (!) 144/93   Wt Readings from Last 3 Encounters:  01/20/24 190 lb 12.8 oz (86.5 kg)  01/16/24 185 lb 12.8 oz (84.3 kg)  11/18/23 193 lb 14.4 oz (88 kg)      Physical Exam Constitutional:      General: He is not in acute distress.    Appearance: Normal appearance. He is not ill-appearing, toxic-appearing or diaphoretic.  HENT:     Head: Normocephalic and atraumatic.     Right Ear: External ear  normal.     Left Ear: External ear normal.     Mouth/Throat:     Mouth: Mucous membranes are moist.     Pharynx: Oropharynx is clear. No oropharyngeal exudate or posterior oropharyngeal erythema.  Eyes:     General: No scleral icterus.       Right eye: No discharge.        Left eye: No discharge.     Extraocular Movements: Extraocular movements intact.     Conjunctiva/sclera: Conjunctivae normal.     Pupils: Pupils are equal, round, and reactive to light.  Cardiovascular:     Rate and Rhythm: Normal rate and regular rhythm.  Pulmonary:     Effort: Pulmonary effort is normal. No respiratory distress.     Breath sounds: Normal breath sounds.  Abdominal:     General: Bowel sounds  are normal.     Tenderness: There is no abdominal tenderness. There is no guarding.  Musculoskeletal:     Cervical back: No rigidity or tenderness.  Skin:    General: Skin is warm and dry.      Neurological:     Mental Status: He is alert and oriented to person, place, and time.  Psychiatric:        Mood and Affect: Mood normal.        Behavior: Behavior normal.      No results found for any visits on 01/20/24.    The 10-year ASCVD risk score (Arnett DK, et al., 2019) is: 29%    Assessment & Plan:   Essential hypertension -     Urinalysis, Routine w reflex microscopic -     Basic metabolic panel with GFR  Mixed hyperlipidemia -     Lipid panel  Tobacco use  Chronic anticoagulation  Screening for diabetes mellitus -     Hemoglobin A1c -     Basic metabolic panel with GFR  Atherosclerotic vascular disease -     Lipid panel  Epidermal inclusion cyst    Return in about 9 months (around 10/11/2024).  Continue all medications as above.  Please stop smoking.  Should consider referral back to vascular surgery if claudication pains are worsening.  Referral to general surgery for cystic mass in the right upper back with any changes or concerns.  Elsie Sim Lent, MD

## 2024-02-06 DIAGNOSIS — M19012 Primary osteoarthritis, left shoulder: Secondary | ICD-10-CM | POA: Diagnosis not present

## 2024-02-11 DIAGNOSIS — H40013 Open angle with borderline findings, low risk, bilateral: Secondary | ICD-10-CM | POA: Diagnosis not present

## 2024-02-11 DIAGNOSIS — H25041 Posterior subcapsular polar age-related cataract, right eye: Secondary | ICD-10-CM | POA: Diagnosis not present

## 2024-02-11 DIAGNOSIS — Z0100 Encounter for examination of eyes and vision without abnormal findings: Secondary | ICD-10-CM | POA: Diagnosis not present

## 2024-02-11 DIAGNOSIS — H25013 Cortical age-related cataract, bilateral: Secondary | ICD-10-CM | POA: Diagnosis not present

## 2024-02-11 DIAGNOSIS — H5203 Hypermetropia, bilateral: Secondary | ICD-10-CM | POA: Diagnosis not present

## 2024-02-11 DIAGNOSIS — H52223 Regular astigmatism, bilateral: Secondary | ICD-10-CM | POA: Diagnosis not present

## 2024-03-16 ENCOUNTER — Other Ambulatory Visit: Payer: Self-pay | Admitting: Family Medicine

## 2024-03-16 DIAGNOSIS — I10 Essential (primary) hypertension: Secondary | ICD-10-CM

## 2024-04-05 DIAGNOSIS — C4442 Squamous cell carcinoma of skin of scalp and neck: Secondary | ICD-10-CM | POA: Diagnosis not present

## 2024-04-05 DIAGNOSIS — L578 Other skin changes due to chronic exposure to nonionizing radiation: Secondary | ICD-10-CM | POA: Diagnosis not present

## 2024-04-05 DIAGNOSIS — D485 Neoplasm of uncertain behavior of skin: Secondary | ICD-10-CM | POA: Diagnosis not present

## 2024-04-19 DIAGNOSIS — M5451 Vertebrogenic low back pain: Secondary | ICD-10-CM | POA: Diagnosis not present

## 2024-04-19 DIAGNOSIS — M9905 Segmental and somatic dysfunction of pelvic region: Secondary | ICD-10-CM | POA: Diagnosis not present

## 2024-04-19 DIAGNOSIS — M9903 Segmental and somatic dysfunction of lumbar region: Secondary | ICD-10-CM | POA: Diagnosis not present

## 2024-04-19 DIAGNOSIS — M62830 Muscle spasm of back: Secondary | ICD-10-CM | POA: Diagnosis not present

## 2024-04-19 DIAGNOSIS — M9904 Segmental and somatic dysfunction of sacral region: Secondary | ICD-10-CM | POA: Diagnosis not present

## 2024-04-21 DIAGNOSIS — M9904 Segmental and somatic dysfunction of sacral region: Secondary | ICD-10-CM | POA: Diagnosis not present

## 2024-04-21 DIAGNOSIS — M5451 Vertebrogenic low back pain: Secondary | ICD-10-CM | POA: Diagnosis not present

## 2024-04-21 DIAGNOSIS — M9903 Segmental and somatic dysfunction of lumbar region: Secondary | ICD-10-CM | POA: Diagnosis not present

## 2024-04-21 DIAGNOSIS — M62830 Muscle spasm of back: Secondary | ICD-10-CM | POA: Diagnosis not present

## 2024-04-21 DIAGNOSIS — M9905 Segmental and somatic dysfunction of pelvic region: Secondary | ICD-10-CM | POA: Diagnosis not present

## 2024-05-01 ENCOUNTER — Other Ambulatory Visit: Payer: Self-pay | Admitting: Family Medicine

## 2024-05-01 DIAGNOSIS — I739 Peripheral vascular disease, unspecified: Secondary | ICD-10-CM

## 2025-01-21 ENCOUNTER — Ambulatory Visit
